# Patient Record
Sex: Female | Born: 1950 | Race: White | Hispanic: No | Smoking: Current some day smoker
Health system: Southern US, Community
[De-identification: ages and names within clinical notes are randomized; demographics above are authoritative.]

## PROBLEM LIST (undated history)

## (undated) DIAGNOSIS — Z973 Presence of spectacles and contact lenses: Secondary | ICD-10-CM

## (undated) DIAGNOSIS — S52502A Unspecified fracture of the lower end of left radius, initial encounter for closed fracture: Secondary | ICD-10-CM

## (undated) DIAGNOSIS — M1990 Unspecified osteoarthritis, unspecified site: Secondary | ICD-10-CM

## (undated) DIAGNOSIS — D649 Anemia, unspecified: Secondary | ICD-10-CM

## (undated) HISTORY — PX: ABDOMINAL HYSTERECTOMY: SHX81

## (undated) HISTORY — PX: JOINT REPLACEMENT: SHX530

## (undated) HISTORY — PX: BACK SURGERY: SHX140

---

## 2012-09-14 ENCOUNTER — Encounter (HOSPITAL_COMMUNITY): Payer: Self-pay | Admitting: Pharmacy Technician

## 2012-09-20 ENCOUNTER — Other Ambulatory Visit: Payer: Self-pay | Admitting: Orthopedic Surgery

## 2012-09-20 NOTE — H&P (Signed)
TOTAL KNEE ADMISSION H&P  Patient is being admitted for left total knee arthroplasty.  Subjective:  Chief Complaint:left knee pain.  HPI: Kim Ponce, 62 y.o. female, has a history of pain and functional disability in the left knee due to arthritis and has failed non-surgical conservative treatments for greater than 12 weeks to includeNSAID's and/or analgesics, corticosteriod injections and activity modification.  Onset of symptoms was gradual, starting 1 years ago with rapidlly worsening course since that time. The patient noted no past surgery on the left knee(s).  Patient currently rates pain in the left knee(s) at 8 out of 10 with activity. Patient has night pain, worsening of pain with activity and weight bearing and pain that interferes with activities of daily living.  Patient has evidence of subchondral cysts and joint space narrowing by imaging studies. There is no active infection.  There are no active problems to display for this patient.  No past medical history on file.  No past surgical history on file.  No prescriptions prior to admission   Allergies  Allergen Reactions  . Morphine And Related Hives  . Penicillins Other (See Comments)    REACTION:  unknown  . Sulfa Antibiotics Other (See Comments)    REACTION:  unknown    History  Substance Use Topics  . Smoking status: Not on file  . Smokeless tobacco: Not on file  . Alcohol Use: Not on file    No family history on file.   Review of Systems  Constitutional: Negative.   HENT: Negative.   Eyes: Negative.   Respiratory: Negative.   Cardiovascular: Negative.   Gastrointestinal: Negative.   Genitourinary: Negative.   Musculoskeletal: Positive for back pain and joint pain.  Skin: Negative.   Neurological: Negative.   Endo/Heme/Allergies: Negative.   Psychiatric/Behavioral: Negative.     Objective:  Physical Exam  Constitutional: She is oriented to person, place, and time. She appears well-developed and  well-nourished.  HENT:  Head: Normocephalic.  Eyes: Pupils are equal, round, and reactive to light.  Cardiovascular: Normal heart sounds.   Respiratory: Breath sounds normal.  GI: Bowel sounds are normal.  Musculoskeletal:       Left knee: She exhibits decreased range of motion and swelling. tenderness found. Medial joint line tenderness noted.  Neurological: She is alert and oriented to person, place, and time.  Psychiatric: She has a normal mood and affect.    Vital signs in last 24 hours:    Labs:   There is no height or weight on file to calculate BMI.   Imaging Review Plain radiographs demonstrate severe degenerative joint disease of the left knee(s). The overall alignment ismild varus. The bone quality appears to be adequate for age and reported activity level.  Assessment/Plan:  End stage arthritis, left knee   The patient history, physical examination, clinical judgment of the provider and imaging studies are consistent with end stage degenerative joint disease of the left knee(s) and total knee arthroplasty is deemed medically necessary. The treatment options including medical management, injection therapy arthroscopy and arthroplasty were discussed at length. The risks and benefits of total knee arthroplasty were presented and reviewed. The risks due to aseptic loosening, infection, stiffness, patella tracking problems, thromboembolic complications and other imponderables were discussed. The patient acknowledged the explanation, agreed to proceed with the plan and consent was signed. Patient is being admitted for inpatient treatment for surgery, pain control, PT, OT, prophylactic antibiotics, VTE prophylaxis, progressive ambulation and ADL's and discharge planning. The patient is planning  to be discharged home with home health services

## 2012-09-21 ENCOUNTER — Ambulatory Visit (HOSPITAL_COMMUNITY)
Admission: RE | Admit: 2012-09-21 | Discharge: 2012-09-21 | Disposition: A | Discharge status: Home / Self Care | Payer: PRIVATE HEALTH INSURANCE | Source: Ambulatory Visit | Attending: Orthopedic Surgery | Admitting: Orthopedic Surgery

## 2012-09-21 ENCOUNTER — Encounter (HOSPITAL_COMMUNITY)
Admission: RE | Admit: 2012-09-21 | Discharge: 2012-09-21 | Disposition: A | Discharge status: Home / Self Care | Payer: PRIVATE HEALTH INSURANCE | Source: Ambulatory Visit | Attending: Orthopedic Surgery | Admitting: Orthopedic Surgery

## 2012-09-21 ENCOUNTER — Encounter (HOSPITAL_COMMUNITY): Payer: Self-pay

## 2012-09-21 DIAGNOSIS — Z01818 Encounter for other preprocedural examination: Secondary | ICD-10-CM | POA: Insufficient documentation

## 2012-09-21 DIAGNOSIS — Z01812 Encounter for preprocedural laboratory examination: Secondary | ICD-10-CM | POA: Insufficient documentation

## 2012-09-21 HISTORY — DX: Unspecified osteoarthritis, unspecified site: M19.90

## 2012-09-21 LAB — SURGICAL PCR SCREEN: Staphylococcus aureus: NEGATIVE

## 2012-09-21 LAB — CBC WITH DIFFERENTIAL/PLATELET
Basophils Absolute: 0 10*3/uL (ref 0.0–0.1)
Basophils Relative: 0 % (ref 0–1)
Eosinophils Absolute: 0.1 10*3/uL (ref 0.0–0.7)
Eosinophils Relative: 1 % (ref 0–5)
HCT: 35.3 % — ABNORMAL LOW (ref 36.0–46.0)
MCHC: 32.9 g/dL (ref 30.0–36.0)
MCV: 94.6 fL (ref 78.0–100.0)
Monocytes Absolute: 0.5 10*3/uL (ref 0.1–1.0)
Platelets: 339 10*3/uL (ref 150–400)
RDW: 12.3 % (ref 11.5–15.5)

## 2012-09-21 LAB — BASIC METABOLIC PANEL
BUN: 16 mg/dL (ref 6–23)
CO2: 24 mEq/L (ref 19–32)
Calcium: 9.7 mg/dL (ref 8.4–10.5)
Chloride: 100 mEq/L (ref 96–112)
Creatinine, Ser: 0.52 mg/dL (ref 0.50–1.10)

## 2012-09-21 LAB — URINALYSIS, ROUTINE W REFLEX MICROSCOPIC
Glucose, UA: NEGATIVE mg/dL
Hgb urine dipstick: NEGATIVE
Specific Gravity, Urine: 1.033 — ABNORMAL HIGH (ref 1.005–1.030)
Urobilinogen, UA: 1 mg/dL (ref 0.0–1.0)
pH: 5.5 (ref 5.0–8.0)

## 2012-09-21 LAB — ABO/RH: ABO/RH(D): A POS

## 2012-09-21 NOTE — Pre-Procedure Instructions (Signed)
Franny Selvage  09/21/2012   Your procedure is scheduled on: Monday, February 3rd.  Report to [facility] at 11 AM.  Call this number if you have problems the morning of surgery: [phone]   Remember:   Do not eat food or drink liquids after midnight.    Take these medicines the morning of surgery with A SIP OF WATER:  Hydrocodone-Acetaminophen (Vicodin) if needed.  Stop taking Aspirin, Coumadin, Plavix, Effient and Herbal medications.  Do not take any NSAIDs ie: Ibuprofen, Advil,Naproxen, Celebrex or any medication containing Aspirin.     Do not wear jewelry, make-up or nail polish.  Do not wear lotions, powders, or perfumes. You may wear deodorant.  Do not shave 48 hours prior to surgery. Men may shave face and neck.  Do not bring valuables to the hospital.  Contacts, dentures or bridgework may not be worn into surgery.  Leave suitcase in the car. After surgery it may be brought to your room.  For patients admitted to the hospital, checkout time is 11:00 AM the day of discharge.    Special Instructions: Shower using CHG 2 nights before surgery and the night before surgery.  If you shower the day of surgery use CHG.  Use special wash - you have one bottle of CHG for all showers.  You should use approximately 1/3 of the bottle for each shower.    Please read over the following fact sheets that you were given: Pain Booklet, Coughing and Deep Breathing, Blood Transfusion Information and Surgical Site Infection Prevention

## 2012-09-23 NOTE — Progress Notes (Signed)
Message left for patient to arrive at [address]

## 2012-09-25 MED ORDER — VANCOMYCIN HCL IN DEXTROSE 1-5 GM/200ML-% IV SOLN
1000.0000 mg | INTRAVENOUS | Status: AC
  Administered 2012-09-26: 1000 mg via INTRAVENOUS
  Filled 2012-09-25: qty 200

## 2012-09-26 ENCOUNTER — Encounter (HOSPITAL_COMMUNITY): Payer: Self-pay | Admitting: Anesthesiology

## 2012-09-26 ENCOUNTER — Encounter (HOSPITAL_COMMUNITY)
Admission: RE | Disposition: A | Discharge status: Home Health | Payer: Self-pay | Source: Ambulatory Visit | Attending: Orthopedic Surgery

## 2012-09-26 ENCOUNTER — Ambulatory Visit (HOSPITAL_COMMUNITY): Payer: PRIVATE HEALTH INSURANCE | Admitting: Anesthesiology

## 2012-09-26 ENCOUNTER — Inpatient Hospital Stay (HOSPITAL_COMMUNITY)
Admission: RE | Admit: 2012-09-26 | Discharge: 2012-09-28 | DRG: 470 | Disposition: A | Discharge status: Home Health | Payer: PRIVATE HEALTH INSURANCE | Source: Ambulatory Visit | Attending: Orthopedic Surgery | Admitting: Orthopedic Surgery

## 2012-09-26 DIAGNOSIS — M1712 Unilateral primary osteoarthritis, left knee: Secondary | ICD-10-CM | POA: Diagnosis present

## 2012-09-26 DIAGNOSIS — D62 Acute posthemorrhagic anemia: Secondary | ICD-10-CM | POA: Diagnosis not present

## 2012-09-26 DIAGNOSIS — Z882 Allergy status to sulfonamides status: Secondary | ICD-10-CM

## 2012-09-26 DIAGNOSIS — Z880 Allergy status to penicillin: Secondary | ICD-10-CM

## 2012-09-26 DIAGNOSIS — Z885 Allergy status to narcotic agent status: Secondary | ICD-10-CM

## 2012-09-26 DIAGNOSIS — M1710 Unilateral primary osteoarthritis, unspecified knee: Principal | ICD-10-CM | POA: Diagnosis present

## 2012-09-26 HISTORY — PX: TOTAL KNEE ARTHROPLASTY: SHX125

## 2012-09-26 SURGERY — ARTHROPLASTY, KNEE, TOTAL
Anesthesia: General | Site: Knee | Laterality: Left | Wound class: Clean

## 2012-09-26 MED ORDER — MEPERIDINE HCL 25 MG/ML IJ SOLN: 6.2500 mg | INTRAMUSCULAR | Status: DC | PRN

## 2012-09-26 MED ORDER — PROMETHAZINE HCL 25 MG/ML IJ SOLN: 6.2500 mg | INTRAMUSCULAR | Status: DC | PRN

## 2012-09-26 MED ORDER — FENTANYL CITRATE 0.05 MG/ML IJ SOLN
INTRAMUSCULAR | Status: DC | PRN
  Administered 2012-09-26: 25 ug via INTRAVENOUS
  Administered 2012-09-26: 100 ug via INTRAVENOUS
  Administered 2012-09-26: 25 ug via INTRAVENOUS
  Administered 2012-09-26: 50 ug via INTRAVENOUS
  Administered 2012-09-26: 100 ug via INTRAVENOUS
  Administered 2012-09-26: 50 ug via INTRAVENOUS
  Administered 2012-09-26 (×2): 25 ug via INTRAVENOUS
  Administered 2012-09-26 (×2): 50 ug via INTRAVENOUS

## 2012-09-26 MED ORDER — ONDANSETRON HCL 4 MG/2ML IJ SOLN: 4.0000 mg | Freq: Four times a day (QID) | INTRAMUSCULAR | Status: DC | PRN

## 2012-09-26 MED ORDER — OXYCODONE HCL 5 MG PO TABS
5.0000 mg | ORAL_TABLET | Freq: Once | ORAL | Status: AC | PRN
  Administered 2012-09-26: 5 mg via ORAL

## 2012-09-26 MED ORDER — KCL IN DEXTROSE-NACL 20-5-0.45 MEQ/L-%-% IV SOLN
INTRAVENOUS | Status: DC
  Administered 2012-09-26 – 2012-09-28 (×5): via INTRAVENOUS
  Filled 2012-09-26 (×8): qty 1000

## 2012-09-26 MED ORDER — LACTATED RINGERS IV SOLN
INTRAVENOUS | Status: DC
  Administered 2012-09-26: 10:00:00 via INTRAVENOUS

## 2012-09-26 MED ORDER — LIDOCAINE HCL (CARDIAC) 20 MG/ML IV SOLN
INTRAVENOUS | Status: DC | PRN
  Administered 2012-09-26: 80 mg via INTRAVENOUS

## 2012-09-26 MED ORDER — CHLORHEXIDINE GLUCONATE 4 % EX LIQD
60.0000 mL | Freq: Once | CUTANEOUS | Status: DC
Start: 2012-09-26 — End: 2012-09-26

## 2012-09-26 MED ORDER — MENTHOL 3 MG MT LOZG: 1.0000 | LOZENGE | OROMUCOSAL | Status: DC | PRN

## 2012-09-26 MED ORDER — METHOCARBAMOL 100 MG/ML IJ SOLN
500.0000 mg | Freq: Four times a day (QID) | INTRAVENOUS | Status: DC | PRN
  Administered 2012-09-26: 500 mg via INTRAVENOUS
  Filled 2012-09-26: qty 5

## 2012-09-26 MED ORDER — METOCLOPRAMIDE HCL 5 MG/ML IJ SOLN: 5.0000 mg | Freq: Three times a day (TID) | INTRAMUSCULAR | Status: DC | PRN

## 2012-09-26 MED ORDER — DEXTROSE-NACL 5-0.45 % IV SOLN: INTRAVENOUS | Status: DC

## 2012-09-26 MED ORDER — HYDROMORPHONE HCL PF 1 MG/ML IJ SOLN
INTRAMUSCULAR | Status: AC
  Administered 2012-09-26: 0.5 mg via INTRAVENOUS
  Filled 2012-09-26: qty 1

## 2012-09-26 MED ORDER — ARTIFICIAL TEARS OP OINT
TOPICAL_OINTMENT | OPHTHALMIC | Status: DC | PRN
  Administered 2012-09-26: 1 via OPHTHALMIC

## 2012-09-26 MED ORDER — ACETAMINOPHEN 650 MG RE SUPP: 650.0000 mg | Freq: Four times a day (QID) | RECTAL | Status: DC | PRN

## 2012-09-26 MED ORDER — CEFUROXIME SODIUM 1.5 G IJ SOLR
INTRAMUSCULAR | Status: AC
  Filled 2012-09-26: qty 1.5

## 2012-09-26 MED ORDER — CELECOXIB 200 MG PO CAPS
200.0000 mg | ORAL_CAPSULE | Freq: Every day | ORAL | Status: DC
  Administered 2012-09-26 – 2012-09-28 (×3): 200 mg via ORAL
  Filled 2012-09-26 (×3): qty 1

## 2012-09-26 MED ORDER — HYDROMORPHONE HCL PF 1 MG/ML IJ SOLN
0.2500 mg | INTRAMUSCULAR | Status: DC | PRN
  Administered 2012-09-26 (×7): 0.5 mg via INTRAVENOUS

## 2012-09-26 MED ORDER — BUPIVACAINE-EPINEPHRINE PF 0.5-1:200000 % IJ SOLN
INTRAMUSCULAR | Status: DC | PRN
  Administered 2012-09-26: 30 mL

## 2012-09-26 MED ORDER — PROPOFOL 10 MG/ML IV BOLUS
INTRAVENOUS | Status: DC | PRN
  Administered 2012-09-26: 170 mg via INTRAVENOUS

## 2012-09-26 MED ORDER — OXYCODONE HCL 5 MG/5ML PO SOLN: 5.0000 mg | Freq: Once | ORAL | Status: AC | PRN

## 2012-09-26 MED ORDER — OXYCODONE HCL 5 MG PO TABS
5.0000 mg | ORAL_TABLET | ORAL | Status: DC | PRN
  Administered 2012-09-26 – 2012-09-28 (×11): 10 mg via ORAL
  Filled 2012-09-26 (×13): qty 2

## 2012-09-26 MED ORDER — HYDROMORPHONE HCL PF 1 MG/ML IJ SOLN
INTRAMUSCULAR | Status: AC
  Administered 2012-09-27: 1 mg via INTRAVENOUS
  Filled 2012-09-26: qty 1

## 2012-09-26 MED ORDER — LACTATED RINGERS IV SOLN
INTRAVENOUS | Status: DC | PRN
  Administered 2012-09-26: 11:00:00 via INTRAVENOUS

## 2012-09-26 MED ORDER — MIDAZOLAM HCL 5 MG/5ML IJ SOLN
INTRAMUSCULAR | Status: DC | PRN
  Administered 2012-09-26 (×2): 1 mg via INTRAVENOUS

## 2012-09-26 MED ORDER — DEXAMETHASONE SODIUM PHOSPHATE 4 MG/ML IJ SOLN
INTRAMUSCULAR | Status: DC | PRN
  Administered 2012-09-26: 4 mg via INTRAVENOUS

## 2012-09-26 MED ORDER — ASPIRIN EC 325 MG PO TBEC
325.0000 mg | DELAYED_RELEASE_TABLET | Freq: Two times a day (BID) | ORAL | Status: DC
  Administered 2012-09-26 – 2012-09-28 (×4): 325 mg via ORAL
  Filled 2012-09-26 (×5): qty 1

## 2012-09-26 MED ORDER — ACETAMINOPHEN 10 MG/ML IV SOLN
1000.0000 mg | Freq: Four times a day (QID) | INTRAVENOUS | Status: AC
  Administered 2012-09-26 – 2012-09-27 (×4): 1000 mg via INTRAVENOUS
  Filled 2012-09-26 (×4): qty 100

## 2012-09-26 MED ORDER — MIDAZOLAM HCL 2 MG/2ML IJ SOLN
1.0000 mg | INTRAMUSCULAR | Status: DC | PRN
  Administered 2012-09-26: 2 mg via INTRAVENOUS

## 2012-09-26 MED ORDER — ONDANSETRON HCL 4 MG PO TABS: 4.0000 mg | ORAL_TABLET | Freq: Four times a day (QID) | ORAL | Status: DC | PRN

## 2012-09-26 MED ORDER — BISACODYL 5 MG PO TBEC: 5.0000 mg | DELAYED_RELEASE_TABLET | Freq: Every day | ORAL | Status: DC | PRN

## 2012-09-26 MED ORDER — MORPHINE SULFATE (PF) 1 MG/ML IV SOLN
INTRAVENOUS | Status: AC
  Filled 2012-09-26: qty 25

## 2012-09-26 MED ORDER — METOCLOPRAMIDE HCL 10 MG PO TABS: 5.0000 mg | ORAL_TABLET | Freq: Three times a day (TID) | ORAL | Status: DC | PRN

## 2012-09-26 MED ORDER — OXYCODONE HCL 5 MG PO TABS
ORAL_TABLET | ORAL | Status: AC
  Administered 2012-09-26: 5 mg via ORAL
  Filled 2012-09-26: qty 1

## 2012-09-26 MED ORDER — ALUM & MAG HYDROXIDE-SIMETH 200-200-20 MG/5ML PO SUSP: 30.0000 mL | ORAL | Status: DC | PRN

## 2012-09-26 MED ORDER — SODIUM CHLORIDE 0.9 % IR SOLN
Status: DC | PRN
  Administered 2012-09-26: 1000 mL
  Administered 2012-09-26: 3000 mL

## 2012-09-26 MED ORDER — HYDROMORPHONE HCL PF 1 MG/ML IJ SOLN
1.0000 mg | INTRAMUSCULAR | Status: DC | PRN
  Administered 2012-09-26 – 2012-09-27 (×5): 1 mg via INTRAVENOUS
  Filled 2012-09-26 (×5): qty 1

## 2012-09-26 MED ORDER — ACETAMINOPHEN 325 MG PO TABS
650.0000 mg | ORAL_TABLET | Freq: Four times a day (QID) | ORAL | Status: DC | PRN
  Administered 2012-09-27 – 2012-09-28 (×2): 650 mg via ORAL
  Filled 2012-09-26 (×2): qty 2

## 2012-09-26 MED ORDER — METHOCARBAMOL 500 MG PO TABS
500.0000 mg | ORAL_TABLET | Freq: Four times a day (QID) | ORAL | Status: DC | PRN
  Administered 2012-09-26 – 2012-09-28 (×5): 500 mg via ORAL
  Filled 2012-09-26 (×6): qty 1

## 2012-09-26 MED ORDER — PHENOL 1.4 % MT LIQD: 1.0000 | OROMUCOSAL | Status: DC | PRN

## 2012-09-26 MED ORDER — DIPHENHYDRAMINE HCL 12.5 MG/5ML PO ELIX: 12.5000 mg | ORAL_SOLUTION | ORAL | Status: DC | PRN

## 2012-09-26 MED ORDER — ONDANSETRON HCL 4 MG/2ML IJ SOLN
INTRAMUSCULAR | Status: DC | PRN
  Administered 2012-09-26: 4 mg via INTRAVENOUS

## 2012-09-26 MED ORDER — FENTANYL CITRATE 0.05 MG/ML IJ SOLN
100.0000 ug | INTRAMUSCULAR | Status: DC | PRN
  Administered 2012-09-26: 100 ug via INTRAVENOUS

## 2012-09-26 MED ORDER — FENTANYL CITRATE 0.05 MG/ML IJ SOLN
INTRAMUSCULAR | Status: AC
  Filled 2012-09-26: qty 2

## 2012-09-26 MED ORDER — FLEET ENEMA 7-19 GM/118ML RE ENEM: 1.0000 | ENEMA | Freq: Once | RECTAL | Status: AC | PRN

## 2012-09-26 MED ORDER — MIDAZOLAM HCL 2 MG/2ML IJ SOLN
INTRAMUSCULAR | Status: AC
  Filled 2012-09-26: qty 2

## 2012-09-26 MED ORDER — MAGNESIUM HYDROXIDE 400 MG/5ML PO SUSP: 30.0000 mL | Freq: Every day | ORAL | Status: DC | PRN

## 2012-09-26 MED ORDER — CEFUROXIME SODIUM 1.5 G IJ SOLR
INTRAMUSCULAR | Status: DC | PRN
  Administered 2012-09-26: 1.5 g

## 2012-09-26 SURGICAL SUPPLY — 52 items
BANDAGE ELASTIC 6 VELCRO ST LF (GAUZE/BANDAGES/DRESSINGS) ×2 IMPLANT
BANDAGE ESMARK 6X9 LF (GAUZE/BANDAGES/DRESSINGS) ×1 IMPLANT
BLADE SAG 18X100X1.27 (BLADE) ×2 IMPLANT
BLADE SAW SGTL 13X75X1.27 (BLADE) ×2 IMPLANT
BLADE SURG ROTATE 9660 (MISCELLANEOUS) IMPLANT
BNDG ELASTIC 6X10 VLCR STRL LF (GAUZE/BANDAGES/DRESSINGS) ×2 IMPLANT
BNDG ESMARK 6X9 LF (GAUZE/BANDAGES/DRESSINGS) ×2
BOWL SMART MIX CTS (DISPOSABLE) ×2 IMPLANT
CEMENT HV SMART SET (Cement) ×4 IMPLANT
CLOTH BEACON ORANGE TIMEOUT ST (SAFETY) ×2 IMPLANT
COVER BACK TABLE 24X17X13 BIG (DRAPES) IMPLANT
COVER SURGICAL LIGHT HANDLE (MISCELLANEOUS) ×2 IMPLANT
CUFF TOURNIQUET SINGLE 34IN LL (TOURNIQUET CUFF) ×2 IMPLANT
CUFF TOURNIQUET SINGLE 44IN (TOURNIQUET CUFF) IMPLANT
DRAPE EXTREMITY T 121X128X90 (DRAPE) ×2 IMPLANT
DRAPE U-SHAPE 47X51 STRL (DRAPES) ×2 IMPLANT
DURAPREP 26ML APPLICATOR (WOUND CARE) ×2 IMPLANT
ELECT REM PT RETURN 9FT ADLT (ELECTROSURGICAL) ×2
ELECTRODE REM PT RTRN 9FT ADLT (ELECTROSURGICAL) ×1 IMPLANT
EVACUATOR 1/8 PVC DRAIN (DRAIN) ×2 IMPLANT
GAUZE XEROFORM 1X8 LF (GAUZE/BANDAGES/DRESSINGS) ×2 IMPLANT
GLOVE BIO SURGEON STRL SZ7 (GLOVE) ×2 IMPLANT
GLOVE BIO SURGEON STRL SZ7.5 (GLOVE) ×2 IMPLANT
GLOVE BIOGEL PI IND STRL 7.0 (GLOVE) ×1 IMPLANT
GLOVE BIOGEL PI IND STRL 8 (GLOVE) ×1 IMPLANT
GLOVE BIOGEL PI INDICATOR 7.0 (GLOVE) ×1
GLOVE BIOGEL PI INDICATOR 8 (GLOVE) ×1
GOWN PREVENTION PLUS XLARGE (GOWN DISPOSABLE) ×4 IMPLANT
GOWN STRL NON-REIN LRG LVL3 (GOWN DISPOSABLE) ×2 IMPLANT
HANDPIECE INTERPULSE COAX TIP (DISPOSABLE) ×1
HOOD PEEL AWAY FACE SHEILD DIS (HOOD) ×6 IMPLANT
KIT BASIN OR (CUSTOM PROCEDURE TRAY) ×2 IMPLANT
KIT ROOM TURNOVER OR (KITS) ×2 IMPLANT
MANIFOLD NEPTUNE II (INSTRUMENTS) ×2 IMPLANT
NS IRRIG 1000ML POUR BTL (IV SOLUTION) ×2 IMPLANT
PACK TOTAL JOINT (CUSTOM PROCEDURE TRAY) ×2 IMPLANT
PAD ARMBOARD 7.5X6 YLW CONV (MISCELLANEOUS) ×4 IMPLANT
PADDING CAST COTTON 6X4 STRL (CAST SUPPLIES) ×2 IMPLANT
SET HNDPC FAN SPRY TIP SCT (DISPOSABLE) ×1 IMPLANT
SPONGE GAUZE 4X4 12PLY (GAUZE/BANDAGES/DRESSINGS) ×2 IMPLANT
STAPLER VISISTAT 35W (STAPLE) ×2 IMPLANT
SUCTION FRAZIER TIP 10 FR DISP (SUCTIONS) ×2 IMPLANT
SUT VIC AB 0 CTX 36 (SUTURE) ×1
SUT VIC AB 0 CTX36XBRD ANTBCTR (SUTURE) ×1 IMPLANT
SUT VIC AB 1 CTX 36 (SUTURE) ×1
SUT VIC AB 1 CTX36XBRD ANBCTR (SUTURE) ×1 IMPLANT
SUT VIC AB 2-0 CT1 27 (SUTURE) ×1
SUT VIC AB 2-0 CT1 TAPERPNT 27 (SUTURE) ×1 IMPLANT
TOWEL OR 17X24 6PK STRL BLUE (TOWEL DISPOSABLE) ×2 IMPLANT
TOWEL OR 17X26 10 PK STRL BLUE (TOWEL DISPOSABLE) ×2 IMPLANT
TRAY FOLEY CATH 14FR (SET/KITS/TRAYS/PACK) ×2 IMPLANT
WATER STERILE IRR 1000ML POUR (IV SOLUTION) ×2 IMPLANT

## 2012-09-26 NOTE — Interval H&P Note (Signed)
History and Physical Interval Note:  09/26/2012 10:32 AM  Kim Ponce  has presented today for surgery, with the diagnosis of OSTEOARTHRITIS LEFT KNEE  The various methods of treatment have been discussed with the patient and family. After consideration of risks, benefits and other options for treatment, the patient has consented to  Procedure(s) (LRB) with comments: TOTAL KNEE ARTHROPLASTY (Left) as a surgical intervention .  The patient's history has been reviewed, patient examined, no change in status, stable for surgery.  I have reviewed the patient's chart and labs.  Questions were answered to the patient's satisfaction.     Nestor Lewandowsky

## 2012-09-26 NOTE — Interval H&P Note (Signed)
History and Physical Interval Note:  09/26/2012 10:31 AM  Kim Ponce  has presented today for surgery, with the diagnosis of OSTEOARTHRITIS LEFT KNEE  The various methods of treatment have been discussed with the patient and family. After consideration of risks, benefits and other options for treatment, the patient has consented to  Procedure(s) (LRB) with comments: TOTAL KNEE ARTHROPLASTY (Left) as a surgical intervention .  The patient's history has been reviewed, patient examined, no change in status, stable for surgery.  I have reviewed the patient's chart and labs.  Questions were answered to the patient's satisfaction.     Nestor Lewandowsky

## 2012-09-26 NOTE — Evaluation (Signed)
Physical Therapy Evaluation Patient Details Name: Kim Ponce MRN: [id number] DOB: September 18, 1950 Today's Date: 09/26/2012 Time: 7829-5621 PT Time Calculation (min): 33 min  PT Assessment / Plan / Recommendation Clinical Impression  Patient is a 62 yo female s/p Lt. TKA.  Will benefit from acute PT to maximize independence prior to return home with 24 hour assist from sister and friends.  Recommend HHPT and RW for home.    PT Assessment  Patient needs continued PT services    Follow Up Recommendations  Home health PT;Supervision/Assistance - 24 hour    Does the patient have the potential to tolerate intense rehabilitation      Barriers to Discharge None      Equipment Recommendations  Rolling walker with 5" wheels    Recommendations for Other Services     Frequency 7X/week    Precautions / Restrictions Precautions Precautions: Knee Precaution Booklet Issued: Yes (comment) Precaution Comments: Reviewed precautions with patient. Restrictions Weight Bearing Restrictions: Yes LLE Weight Bearing: Weight bearing as tolerated   Pertinent Vitals/Pain       Mobility  Bed Mobility Bed Mobility: Supine to Sit;Sitting - Scoot to Edge of Bed;Sit to Supine Supine to Sit: 4: Min assist;HOB flat Sitting - Scoot to Edge of Bed: 4: Min guard Sit to Supine: 4: Min assist;HOB flat Details for Bed Mobility Assistance: Verbal cues for technique.  Assist to move LLE off of and onto bed.  Patient sat EOB x 10 minutes with good balance and upright posture. Transfers Transfers: Not assessed    Exercises Total Joint Exercises Ankle Circles/Pumps: AROM;Both;10 reps;Supine   PT Diagnosis: Difficulty walking;Acute pain  PT Problem List: Decreased strength;Decreased range of motion;Decreased activity tolerance;Decreased mobility;Decreased knowledge of use of DME;Decreased knowledge of precautions;Pain PT Treatment Interventions: DME instruction;Gait training;Functional mobility  training;Therapeutic exercise;Patient/family education   PT Goals Acute Rehab PT Goals PT Goal Formulation: With patient Time For Goal Achievement: 10/03/12 Potential to Achieve Goals: Good Pt will go Supine/Side to Sit: Independently;with HOB 0 degrees PT Goal: Supine/Side to Sit - Progress: Goal set today Pt will go Sit to Supine/Side: Independently;with HOB 0 degrees PT Goal: Sit to Supine/Side - Progress: Goal set today Pt will go Sit to Stand: with supervision;with upper extremity assist PT Goal: Sit to Stand - Progress: Goal set today Pt will Ambulate: >150 feet;with supervision;with rolling walker PT Goal: Ambulate - Progress: Goal set today Pt will Perform Home Exercise Program: with supervision, verbal cues required/provided PT Goal: Perform Home Exercise Program - Progress: Goal set today  Visit Information  Last PT Received On: 09/26/12 Assistance Needed: +1    Subjective Data  Subjective: "My sister is coming to stay with me." Patient Stated Goal: To walk.  To decrease pain.   Prior Functioning  Home Living Lives With: Alone Available Help at Discharge: Family;Friend(s);Available 24 hours/day (Sister coming to stay with patient.) Type of Home: Apartment Home Access: Level entry Home Layout: One level Bathroom Shower/Tub: Engineer, manufacturing systems: Standard Bathroom Accessibility: Yes How Accessible: Accessible via walker Home Adaptive Equipment: Crutches;Straight cane Prior Function Level of Independence: Independent with assistive device(s);Needs assistance (using crutches or cane) Needs Assistance: Light Housekeeping Light Housekeeping: Maximal Able to Take Stairs?: No Driving: No Vocation: Unemployed Communication Communication: No difficulties    Cognition  Cognition Overall Cognitive Status: Appears within functional limits for tasks assessed/performed Arousal/Alertness: Awake/alert Orientation Level: Oriented X4 / Intact Behavior During  Session: [facility] for tasks performed    Extremity/Trunk Assessment Right Upper Extremity Assessment RUE ROM/Strength/Tone:  Deficits RUE ROM/Strength/Tone Deficits: Patient reports weakness/discomfort upper arms from using crutches pta. RUE Sensation: WFL - Light Touch Left Upper Extremity Assessment LUE ROM/Strength/Tone: Deficits LUE ROM/Strength/Tone Deficits: Patient reports weakness/discomfort upper arms from using crutches pta. LUE Sensation: WFL - Light Touch Right Lower Extremity Assessment RLE ROM/Strength/Tone: WFL for tasks assessed (Reports she needs to have a TKA on RLE as well) RLE Sensation: WFL - Light Touch Left Lower Extremity Assessment LLE ROM/Strength/Tone: Deficits;Unable to fully assess;Due to pain LLE ROM/Strength/Tone Deficits: Able to assist moving LLE off of and onto bed. Trunk Assessment Trunk Assessment: Normal   Balance Balance Balance Assessed: Yes Static Sitting Balance Static Sitting - Balance Support: No upper extremity supported;Feet supported Static Sitting - Level of Assistance: 5: Stand by assistance Static Sitting - Comment/# of Minutes: Patient able to maintain sitting balance x 10 minutes without UE support.  End of Session PT - End of Session Equipment Utilized During Treatment: Oxygen Activity Tolerance: Patient limited by pain;Patient limited by fatigue Patient left: in bed;in CPM;with call bell/phone within reach;with family/visitor present Nurse Communication: Mobility status;Patient requests pain meds CPM Left Knee CPM Left Knee: On Left Knee Flexion (Degrees): 60  Left Knee Extension (Degrees): 0   GP     Vena Austria 09/26/2012, 5:32 PM Durenda Hurt. Renaldo Fiddler, [facility] Acute Rehab Services Pager [phone]

## 2012-09-26 NOTE — Anesthesia Preprocedure Evaluation (Signed)
Anesthesia Evaluation  Patient identified by MRN, date of birth, ID band Patient awake    Reviewed: Allergy & Precautions, H&P , NPO status , Patient's Chart, lab work & pertinent test results  History of Anesthesia Complications Negative for: history of anesthetic complications  Airway Mallampati: I      Dental No notable dental hx. (+) Teeth Intact   Pulmonary neg pulmonary ROS,  breath sounds clear to auscultation  Pulmonary exam normal       Cardiovascular negative cardio ROS  IRhythm:regular Rate:Normal     Neuro/Psych negative neurological ROS  negative psych ROS   GI/Hepatic negative GI ROS, Neg liver ROS,   Endo/Other  negative endocrine ROS  Renal/GU negative Renal ROS  negative genitourinary   Musculoskeletal   Abdominal   Peds  Hematology negative hematology ROS (+)   Anesthesia Other Findings   Reproductive/Obstetrics negative OB ROS                           Anesthesia Physical Anesthesia Plan  ASA: I  Anesthesia Plan: General and General LMA   Post-op Pain Management: MAC Combined w/ Regional for Post-op pain   Induction:   Airway Management Planned:   Additional Equipment:   Intra-op Plan:   Post-operative Plan:   Informed Consent: I have reviewed the patients History and Physical, chart, labs and discussed the procedure including the risks, benefits and alternatives for the proposed anesthesia with the patient or authorized representative who has indicated his/her understanding and acceptance.   Dental Advisory Given  Plan Discussed with: CRNA and Surgeon  Anesthesia Plan Comments:         Anesthesia Quick Evaluation

## 2012-09-26 NOTE — Progress Notes (Signed)
Dr. Justin Mend to bedside.  Pt. States pain is better.  Pt. Resting quietly.

## 2012-09-26 NOTE — Progress Notes (Signed)
Orthopedic Tech Progress Note Patient Details:  Kim Ponce 1951/07/04 [id number]  CPM Left Knee CPM Left Knee: On Left Knee Flexion (Degrees): 60  Left Knee Extension (Degrees): 0    Shawnie Pons 09/26/2012, 1:30 PM

## 2012-09-26 NOTE — Progress Notes (Signed)
Notified Dr. Justin Mend of pts continued pain level of 10 with tears despite 1.5 mg Dilaudid and Oxy IR 5mg .  Dr. Justin Mend stated to continue with additional Dilaudid.

## 2012-09-26 NOTE — Op Note (Signed)
PATIENT ID:      Kim Ponce  MRN:     [id number] DOB/AGE:    61-Apr-1952 / 61 y.o.       OPERATIVE REPORT    DATE OF PROCEDURE:  09/26/2012       PREOPERATIVE DIAGNOSIS:   OSTEOARTHRITIS LEFT KNEE      There is no height or weight on file to calculate BMI.                                                        POSTOPERATIVE DIAGNOSIS:   OSTEOARTHRITIS LEFT KNEE                                                                      PROCEDURE:  Procedure(s): TOTAL KNEE ARTHROPLASTY Using Depuy Sigma RP implants #5L Femur, #4Tibia, 10mm sigma RP bearing, 25 Patella     SURGEON: Haislee Corso J    ASSISTANT:   Shirl Harris PA-C   (Present and scrubbed throughout the case, critical for assistance with exposure, retraction, instrumentation, and closure.)         ANESTHESIA: GET with Femoral Nerve Block  DRAINS: foley, 2 medium hemovac in knee   TOURNIQUET TIME:   COMPLICATIONS:  None     SPECIMENS: None   INDICATIONS FOR PROCEDURE: The patient has  OSTEOARTHRITIS LEFT KNEE, varus deformities, XR shows bone on bone arthritis. Patient has failed all conservative measures including anti-inflammatory medicines, narcotics, attempts at  exercise and weight loss, cortisone injections and viscosupplementation.  Risks and benefits of surgery have been discussed, questions answered.   DESCRIPTION OF PROCEDURE: The patient identified by armband, received  right femoral nerve block and IV antibiotics, in the holding area at [facility]. Patient taken to the operating room, appropriate anesthetic  monitors were attached General endotracheal anesthesia induced with  the patient in supine position, Foley catheter was inserted. Tourniquet  applied high to the operative thigh. Lateral post and foot positioner  applied to the table, the lower extremity was then prepped and draped  in usual sterile fashion from the ankle to the tourniquet. Time-out procedure was performed. The limb was  wrapped with an Esmarch bandage and the tourniquet inflated to 350 mmHg. We began the operation by making the anterior midline incision starting at handbreadth above the patella going over the patella 1 cm medial to and  4 cm distal to the tibial tubercle. Small bleeders in the skin and the  subcutaneous tissue identified and cauterized. Transverse retinaculum was incised and reflected medially and a medial parapatellar arthrotomy was accomplished. the patella was everted and theprepatellar fat pad resected. The superficial medial collateral  ligament was then elevated from anterior to posterior along the proximal  flare of the tibia and anterior half of the menisci resected. The knee was hyperflexed exposing bone on bone arthritis. Peripheral and notch osteophytes as well as the cruciate ligaments were then resected. We continued to  work our way around posteriorly along the proximal tibia, and externally  rotated the tibia subluxing it out from underneath the femur. A  McHale  retractor was placed through the notch and a lateral Hohmann retractor  placed, and we then drilled through the proximal tibia in line with the  axis of the tibia followed by an intramedullary guide rod and 2-degree  posterior slope cutting guide. The tibial cutting guide was pinned into place  allowing resection of 4 mm of bone medially and about 8 mm of bone  laterally because of her varus deformity. Satisfied with the tibial resection, we then  entered the distal femur 2 mm anterior to the PCL origin with the  intramedullary guide rod and applied the distal femoral cutting guide  set at 11mm, with 5 degrees of valgus. This was pinned along the  epicondylar axis. At this point, the distal femoral cut was accomplished without difficulty. We then sized for a #5R femoral component and pinned the guide in 3 degrees of external rotation.The chamfer cutting guide was pinned into place. The anterior, posterior, and chamfer cuts  were accomplished without difficulty followed by  the Sigma RP box cutting guide and the box cut. We also removed posterior osteophytes from the posterior femoral condyles. At this  time, the knee was brought into full extension. We checked our  extension and flexion gaps and found them symmetric at 10mm.  The patella thickness measured at 23 mm. We set the cutting guide at 14 and removed the posterior 9 mm  of the patella sized for 35 button and drilled the lollipop. The knee  was then once again hyperflexed exposing the proximal tibia. We sized for a #4 tibial base plate, applied the smokestack and the conical reamer followed by the the Delta fin keel punch. We then hammered into place the Sigma RP trial femoral component, inserted a 10-mm trial bearing, trial patellar button, and took the knee through range of motion from 0-130 degrees. No thumb pressure was required for patellar  tracking. At this point, all trial components were removed, a double batch of DePuy HV cement with 1500 mg of Zinacef was mixed and applied to all bony metallic mating surfaces except for the posterior condyles of the femur itself. In order, we  hammered into place the tibial tray and removed excess cement, the femoral component and removed excess cement, a 10-mm Sigma RP bearing  was inserted, and the knee brought to full extension with compression.  The patellar button was clamped into place, and excess cement  removed. While the cement cured the wound was irrigated out with normal saline solution pulse lavage, and medium Hemovac drains were placed from an anterolateral  approach. Ligament stability and patellar tracking were checked and found to be excellent. The parapatellar arthrotomy was closed with  running #1 Vicryl suture. The subcutaneous tissue with 0 and 2-0 undyed  Vicryl suture, and the skin with skin staples. A dressing of Xeroform,  4 x 4, dressing sponges, Webril, and Ace wrap applied. The patient   awakened, extubated, and taken to recovery room without difficulty.   [name] J 09/26/2012, 12:04 PM

## 2012-09-26 NOTE — Progress Notes (Signed)
UR COMPLETED  

## 2012-09-26 NOTE — Plan of Care (Signed)
Problem: Consults Goal: Diagnosis- Total Joint Replacement Primary Total Knee LEFT     

## 2012-09-26 NOTE — Anesthesia Postprocedure Evaluation (Signed)
Anesthesia Post Note  Patient: Kim Ponce  Procedure(s) Performed: Procedure(s) (LRB): TOTAL KNEE ARTHROPLASTY (Left)  Anesthesia type: general  Patient location: PACU  Post pain: Pain level controlled  Post assessment: Patient's Cardiovascular Status Stable  Last Vitals:  Filed Vitals:   09/26/12 1345  BP: 133/66  Pulse: 61  Temp:   Resp: 12    Post vital signs: Reviewed and stable  Level of consciousness: sedated  Complications: No apparent anesthesia complications

## 2012-09-26 NOTE — Preoperative (Signed)
Beta Blockers   Reason not to administer Beta Blockers: not prescribed 

## 2012-09-26 NOTE — Anesthesia Procedure Notes (Addendum)
Anesthesia Regional Block:  Femoral nerve block  Pre-Anesthetic Checklist: ,, timeout performed, Correct Patient, Correct Site, Correct Laterality, Correct Procedure, Correct Position, site marked, Risks and benefits discussed, at surgeon's request and post-op pain management  Laterality: Left and Upper  Prep: chloraprep       Needles:  Injection technique: Single-shot  Needle Type: Echogenic Needle      Needle Gauge: 22 and 22 G  Needle insertion depth: 6 cm   Additional Needles:  Procedures: ultrasound guided (picture in chart) and nerve stimulator Femoral nerve block  Nerve Stimulator or Paresthesia:  Response: Twitch elicited, 0.8 mA,   Additional Responses:   Narrative:  Start time: 09/26/2012 9:55 AM End time: 09/26/2012 10:11 AM Injection made incrementally with aspirations every 5 mL.  Performed by: Personally  Anesthesiologist: Alma Friendly, MD  Additional Notes: BP cuff, EKG monitors applied. Sedation begun. Femoral artery palpated for location of nerve. After nerve location anesthetic injected incrementally, slowly , and after neg aspirations. Tolerated well.  Femoral nerve block Procedure Name: LMA Insertion Date/Time: 09/26/2012 10:46 AM Performed by: Gayla Medicus Pre-anesthesia Checklist: Patient identified, Timeout performed, Emergency Drugs available, Suction available and Patient being monitored Patient Re-evaluated:Patient Re-evaluated prior to inductionOxygen Delivery Method: Circle system utilized Preoxygenation: Pre-oxygenation with 100% oxygen Intubation Type: IV induction LMA: LMA inserted LMA Size: 4.0 Number of attempts: 1 Placement Confirmation: positive ETCO2 and breath sounds checked- equal and bilateral Tube secured with: Tape Dental Injury: Teeth and Oropharynx as per pre-operative assessment

## 2012-09-26 NOTE — Transfer of Care (Signed)
Immediate Anesthesia Transfer of Care Note  Patient: Kim Ponce  Procedure(s) Performed: Procedure(s) (LRB) with comments: TOTAL KNEE ARTHROPLASTY (Left)  Patient Location: PACU  Anesthesia Type:GA combined with regional for post-op pain  Level of Consciousness: awake, alert  and oriented  Airway & Oxygen Therapy: Patient Spontanous Breathing and Patient connected to nasal cannula oxygen  Post-op Assessment: Report given to PACU RN and Post -op Vital signs reviewed and stable  Post vital signs: Reviewed and stable  Complications: No apparent anesthesia complications

## 2012-09-27 ENCOUNTER — Encounter (HOSPITAL_COMMUNITY): Payer: Self-pay | Admitting: General Practice

## 2012-09-27 LAB — BASIC METABOLIC PANEL
BUN: 6 mg/dL (ref 6–23)
Calcium: 8.6 mg/dL (ref 8.4–10.5)
GFR calc non Af Amer: >90 mL/min (ref >90)
Glucose, Bld: 107 mg/dL — ABNORMAL HIGH (ref 70–99)

## 2012-09-27 LAB — CBC
HCT: 26.7 % — ABNORMAL LOW (ref 36.0–46.0)
Hemoglobin: 8.8 g/dL — ABNORMAL LOW (ref 12.0–15.0)
MCH: 30.9 pg (ref 26.0–34.0)
MCHC: 33 g/dL (ref 30.0–36.0)

## 2012-09-27 MED ORDER — ZOLPIDEM TARTRATE 5 MG PO TABS
5.0000 mg | ORAL_TABLET | Freq: Every evening | ORAL | Status: DC | PRN
  Administered 2012-09-27: 5 mg via ORAL
  Filled 2012-09-27: qty 1

## 2012-09-27 NOTE — Progress Notes (Signed)
Patient ID: Kim Ponce, female   DOB: [birth year], 62 y.o.   MRN: [id number] PATIENT ID: Kim Ponce  MRN: [id number]  DOB/AGE:  May 13, 1951 / 62 y.o.  1 Day Post-Op Procedure(s) (LRB): TOTAL KNEE ARTHROPLASTY (Left)    PROGRESS NOTE Subjective: Patient is alert, oriented, no Nausea, no Vomiting, yes passing gas, no Bowel Movement. Taking PO well. Denies SOB, Chest or Calf Pain. Using Incentive Spirometer, PAS in place. Ambulate WBAT, CPM 0-60 Patient reports pain as 4 on 0-10 scale  .    Objective: Vital signs in last 24 hours: Filed Vitals:   09/27/12 0000 09/27/12 0214 09/27/12 0354 09/27/12 0500  BP:  138/63  100/53  Pulse:  72  66  Temp:  97.9 F (36.6 C)  97.5 F (36.4 C)  TempSrc:  Oral  Oral  Resp: 20 18 18 18   SpO2: 98% 97% 97% 98%      Intake/Output from previous day: I/O last 3 completed shifts: In: 1260 [P.O.:60; I.V.:1200] Out: 3225 [Urine:2750; Drains:450; Blood:25]   Intake/Output this shift:     LABORATORY DATA:  Basename 09/27/12 0400  WBC 7.0  HGB 8.8*  HCT 26.7*  PLT 298  NA 137  K 3.1*  CL 100  CO2 27  BUN 6  CREATININE 0.56  GLUCOSE 107*  GLUCAP --  INR --  CALCIUM 8.6    Examination: Neurologically intact ABD soft Neurovascular intact Sensation intact distally Intact pulses distally Dorsiflexion/Plantar flexion intact Incision: no drainage No cellulitis present Compartment soft} Blood and plasma separated in drain indicating minimal recent drainage, drain pulled without difficulty.  Assessment:   1 Day Post-Op Procedure(s) (LRB): TOTAL KNEE ARTHROPLASTY (Left) ADDITIONAL DIAGNOSIS:    Plan: PT/OT WBAT, CPM 5/hrs day until ROM 0-90 degrees, then D/C CPM DVT Prophylaxis:  SCDx72hrs, ASA 325 mg BID x 2 weeks DISCHARGE PLAN: Home, after patient passes PT, probably tomorrow DISCHARGE NEEDS: HHPT, HHRN, CPM, Walker and 3-in-1 comode seat     Damion Kant J 09/27/2012, 7:31 AM

## 2012-09-27 NOTE — Progress Notes (Signed)
Seen and agreed 09/27/2012 Caitlan Chauca Elizabeth PTA [phone] pager [phone] office    

## 2012-09-27 NOTE — Progress Notes (Signed)
Seen and agreed 09/27/2012 Fredrich Birks PTA [phone] pager [phone] office

## 2012-09-27 NOTE — Progress Notes (Signed)
Physical Therapy Treatment Patient Details Name: Kim Ponce MRN: [id number] DOB: 1951/07/27 Today's Date: 09/27/2012 Time: 1308-6578 PT Time Calculation (min): 27 min  PT Assessment / Plan / Recommendation Comments on Treatment Session  Pt with increase pain this session. Nursing in room with pain medication prior to treatment. Agreeable to in room ambulation. Will attempt long hall ambulation later today and progress exercises as tolerable.     Follow Up Recommendations  Home health PT;Supervision/Assistance - 24 hour     Does the patient have the potential to tolerate intense rehabilitation     Barriers to Discharge        Equipment Recommendations  Rolling walker with 5" wheels    Recommendations for Other Services    Frequency 7X/week   Plan Discharge plan remains appropriate;Frequency remains appropriate    Precautions / Restrictions Precautions Precautions: Knee Restrictions Weight Bearing Restrictions: Yes LLE Weight Bearing: Weight bearing as tolerated   Pertinent Vitals/Pain 8/10. Meds prior to treatment    Mobility  Bed Mobility Supine to Sit: 4: Min guard Details for Bed Mobility Assistance: min Guard for safety. Pt able to self assist to manage LLE Transfers Transfers: Sit to Stand;Stand to Sit Sit to Stand: 4: Min guard Stand to Sit: 4: Min guard Details for Transfer Assistance: cueing for hand placement and controlling descent Ambulation/Gait Ambulation Distance (Feet): 30 Feet (15x2) Assistive device: Rolling walker Ambulation/Gait Assistance Details: Cueing to maintain upright posture and gait sequence Gait Pattern: Step-to pattern;Decreased step length - right    Exercises Total Joint Exercises Ankle Circles/Pumps: AROM;Both;15 reps;Supine Quad Sets: AROM;Both;15 reps;Supine Long Arc Quad: AROM;Left;10 reps;Limitations Long Arc Quad Limitations: limited ROM Knee Flexion: AAROM;Left;15 reps   PT Diagnosis:    PT Problem List:   PT Treatment  Interventions:     PT Goals Acute Rehab PT Goals PT Goal: Supine/Side to Sit - Progress: Progressing toward goal PT Goal: Sit to Stand - Progress: Progressing toward goal PT Goal: Ambulate - Progress: Progressing toward goal PT Goal: Perform Home Exercise Program - Progress: Progressing toward goal  Visit Information  Last PT Received On: 09/27/12 Assistance Needed: +1    Subjective Data      Cognition  Cognition Overall Cognitive Status: Appears within functional limits for tasks assessed/performed Arousal/Alertness: Awake/alert Orientation Level: Appears intact for tasks assessed Behavior During Session: [facility] for tasks performed    Balance     End of Session PT - End of Session Equipment Utilized During Treatment: Gait belt Activity Tolerance: Patient tolerated treatment well Patient left: in chair;with call bell/phone within reach CPM Left Knee CPM Left Knee: Off   GP     Lazaro Arms 09/27/2012, 10:23 AM

## 2012-09-27 NOTE — Evaluation (Signed)
Occupational Therapy Evaluation Patient Details Name: Kim Ponce MRN: [id number] DOB: 09-05-1950 Today's Date: 09/27/2012 Time: 4098-1191 OT Time Calculation (min): 19 min  OT Assessment / Plan / Recommendation Clinical Impression  Pt presents to OT s/p L TKR. Pts pain limiting participation at this time. Pt will benefit from skilled OT to increase I with ADL activity and return to PLOF    OT Assessment  Patient needs continued OT Services    Follow Up Recommendations  Home health OT       Equipment Recommendations  3 in 1 bedside comode;Tub/shower bench;Other (comment) (Pt may be able to borrow these items)       Frequency  Min 3X/week    Precautions / Restrictions Precautions Precautions: Knee Restrictions Weight Bearing Restrictions: Yes LLE Weight Bearing: Weight bearing as tolerated       ADL  Upper Body Bathing: Simulated;Set up Where Assessed - Upper Body Bathing: Unsupported sitting Lower Body Bathing: Simulated;Moderate assistance Where Assessed - Lower Body Bathing: Supported sit to stand Upper Body Dressing: Simulated;Set up Where Assessed - Upper Body Dressing: Unsupported sitting Lower Body Dressing: Simulated;Moderate assistance Where Assessed - Lower Body Dressing: Supported sit to Pharmacist, hospital: Simulated;Moderate assistance Toilet Transfer Method: Sit to stand Toileting - Clothing Manipulation and Hygiene: Simulated;Minimal assistance Transfers/Ambulation Related to ADLs: Pt educated in technique for LB dressing, as well as OT did bring tub bench in for pt to see.  Educated on use as pt stated getting in and out of tub was a challenge before surgery.  Pt will inquire with friend in regards to borrowing one.    OT Diagnosis: Generalized weakness;Acute pain  OT Problem List: Decreased strength;Pain;Decreased activity tolerance OT Treatment Interventions: Self-care/ADL training;DME and/or AE instruction;Patient/family education   OT  Goals Acute Rehab OT Goals OT Goal Formulation: With patient Time For Goal Achievement: 10/11/12 ADL Goals Pt Will Perform Lower Body Dressing: with modified independence;Sit to stand from chair ADL Goal: Lower Body Dressing - Progress: Goal set today Pt Will Transfer to Toilet: with modified independence;Comfort height toilet ADL Goal: Toilet Transfer - Progress: Goal set today Pt Will Perform Toileting - Clothing Manipulation: with modified independence;Standing ADL Goal: Toileting - Clothing Manipulation - Progress: Goal set today Pt Will Perform Tub/Shower Transfer: Transfer tub bench;Tub transfer;with supervision ADL Goal: Tub/Shower Transfer - Progress: Goal set today  Visit Information  Last OT Received On: 09/27/12 Assistance Needed: +1 Reason Eval/Treat Not Completed: Pain limiting ability to participate    Subjective Data  Subjective: I am in quite a bit of pain ( RN brought meds when OT in room)   Prior Functioning     Home Living Lives With: Alone Available Help at Discharge: Family;Friend(s);Available 24 hours/day (Sister coming to stay with patient.) Type of Home: Apartment Home Access: Level entry Home Layout: One level Bathroom Shower/Tub: Engineer, manufacturing systems: Standard Bathroom Accessibility: Yes How Accessible: Accessible via walker Home Adaptive Equipment: Crutches;Straight cane Additional Comments: Pt is going to inquire with friend regarding possible borrowing 3 n1 and a tub seat. Prior Function Level of Independence: Independent with assistive device(s);Needs assistance (using crutches or cane) Needs Assistance: Light Housekeeping Light Housekeeping: Maximal Able to Take Stairs?: No Driving: No Vocation: Unemployed Communication Communication: No difficulties            Cognition  Cognition Overall Cognitive Status: Appears within functional limits for tasks assessed/performed Arousal/Alertness: Awake/alert Orientation Level:  Appears intact for tasks assessed Behavior During Session: [facility] for tasks performed  Extremity/Trunk Assessment Right Upper Extremity Assessment RUE ROM/Strength/Tone: Deficits RUE ROM/Strength/Tone Deficits: strength 3/5 RUE Sensation: Deficits RUE Sensation Deficits: strength 3/5     Mobility Bed Mobility Bed Mobility: Not assessed Supine to Sit: 4: Min guard Details for Bed Mobility Assistance: min Guard for safety. Pt able to self assist to manage LLE Transfers Transfers: Sit to Stand;Stand to Sit Sit to Stand: 4: Min assist;With upper extremity assist Stand to Sit: 4: Min assist;With upper extremity assist;To chair/3-in-1 Details for Transfer Assistance: Pain limiting pts participation this OT visit.             End of Session OT - End of Session Activity Tolerance: Patient limited by pain Patient left: in chair;with call bell/phone within reach Nurse Communication: Patient requests pain meds CPM Left Knee CPM Left Knee: Off  GO     Alba Cory 09/27/2012, 11:54 AM

## 2012-09-27 NOTE — Progress Notes (Signed)
Physical Therapy Treatment Patient Details Name: Kim Ponce MRN: [id number] DOB: [birth year] Today's Date: 09/27/2012 Time: 4098-1191 PT Time Calculation (min): 30 min  PT Assessment / Plan / Recommendation Comments on Treatment Session  Pt continues to progress with therapy.Increased distance and able to progress to step through gait sequence. Will continue POC and progress as tolerable.     Follow Up Recommendations  Home health PT;Supervision/Assistance - 24 hour                 Equipment Recommendations  Rolling walker with 5" wheels    Recommendations for Other Services    Frequency 7X/week   Plan Discharge plan remains appropriate;Frequency remains appropriate    Precautions / Restrictions Precautions Precautions: Knee Restrictions LLE Weight Bearing: Weight bearing as tolerated       Mobility  Bed Mobility Bed Mobility: Not assessed Supine to Sit: 4: Min guard Sit to Supine: 4: Min guard Details for Bed Mobility Assistance: min Guard for safety. Pt able to self assist to manage LLE Transfers Transfers: Sit to Stand;Stand to Sit Sit to Stand: 4: Min guard;With upper extremity assist;From bed Stand to Sit: 4: Min guard;To bed;With upper extremity assist Details for Transfer Assistance: Pain limiting pts participation this OT visit.   Ambulation/Gait Ambulation/Gait Assistance: 5: Supervision Ambulation Distance (Feet): 200 Feet Assistive device: Rolling walker Ambulation/Gait Assistance Details: Able to progress to step through with min cueing. Pt attempting equal step length. Gait Pattern: Step-to pattern;Step-through pattern    Exercises Total Joint Exercises Ankle Circles/Pumps: AROM;Both;15 reps;Supine Quad Sets: AROM;Both;15 reps;Supine Heel Slides: AROM;10 reps;Left;Supine Hip ABduction/ADduction: AROM;Left;Supine;10 reps Straight Leg Raises: AAROM;Left;10 reps;Supine Long Arc Quad: AROM;Left;Seated;15 reps Long Arc Quad Limitations: limited  ROM Knee Flexion: AAROM;Left;15 reps      PT Goals Acute Rehab PT Goals PT Goal: Supine/Side to Sit - Progress: Progressing toward goal PT Goal: Sit to Stand - Progress: Progressing toward goal PT Goal: Ambulate - Progress: Met PT Goal: Perform Home Exercise Program - Progress: Progressing toward goal  Visit Information  Last PT Received On: 09/27/12 Assistance Needed: +1    Subjective Data      Cognition  Cognition Overall Cognitive Status: Appears within functional limits for tasks assessed/performed Arousal/Alertness: Awake/alert Orientation Level: Appears intact for tasks assessed Behavior During Session: [facility] for tasks performed    Balance     End of Session PT - End of Session Equipment Utilized During Treatment: Gait belt Activity Tolerance: Patient tolerated treatment well Patient left: in bed;with call bell/phone within reach CPM Left Knee CPM Left Knee: On Left Knee Flexion (Degrees): 65  Left Knee Extension (Degrees): 0    GP     Lillia Mountain, Mohd. Derflinger 09/27/2012, 1:48 PM

## 2012-09-27 NOTE — Care Management Note (Signed)
    Page 1 of 1   09/27/2012     2:15:18 PM   CARE MANAGEMENT NOTE 09/27/2012  Patient:  Kim Ponce, Kim Ponce   Account Number:  [id number]  Date Initiated:  09/26/2012  Documentation initiated by:  Anette Guarneri  Subjective/Objective Assessment:   left TKA  SNF/[facility] vs Home w/HH     Action/Plan:   follow progess w/PT to determine SNF vs HH  2/4 d/c plan is for Home w/HH services   Anticipated DC Date:  09/28/2012   Anticipated DC Plan:  HOME W HOME HEALTH SERVICES      DC Planning Services  CM consult      Choice offered to / List presented to:             Status of service:  Completed, signed off Medicare Important Message given?  NO (If response is "NO", the following Medicare IM given date fields will be blank) Date Medicare IM given:   Date Additional Medicare IM given:    Discharge Disposition:  HOME W HOME HEALTH SERVICES  Per UR Regulation:    If discussed at Long Length of Stay Meetings, dates discussed:    Comments:  09/27/12 14:12 Anette Guarneri RN/CM DME(RW/3n1) arranged with [facility] prior to admission, RW has already been delivered to room CPM and 3n1 to be delivered to home, patient plans to borrow tub bench from friend  09/26/12  15:43  Anette Guarneri RN/CM Patient was prearranged w/[facility] for [facility] services if needed If SNF needed, plan for [facility]

## 2012-09-28 ENCOUNTER — Encounter (HOSPITAL_COMMUNITY): Payer: Self-pay | Admitting: Orthopedic Surgery

## 2012-09-28 DIAGNOSIS — M1712 Unilateral primary osteoarthritis, left knee: Secondary | ICD-10-CM | POA: Diagnosis present

## 2012-09-28 LAB — CBC
Hemoglobin: 8.2 g/dL — ABNORMAL LOW (ref 12.0–15.0)
MCH: 30.4 pg (ref 26.0–34.0)
MCHC: 31.9 g/dL (ref 30.0–36.0)
Platelets: 273 10*3/uL (ref 150–400)

## 2012-09-28 MED ORDER — METHOCARBAMOL 500 MG PO TABS: 500.0000 mg | ORAL_TABLET | Freq: Four times a day (QID) | ORAL | Status: DC | PRN

## 2012-09-28 MED ORDER — ZOLPIDEM TARTRATE 5 MG PO TABS: 5.0000 mg | ORAL_TABLET | Freq: Every evening | ORAL | Status: DC | PRN

## 2012-09-28 MED ORDER — ASPIRIN 325 MG PO TBEC: 325.0000 mg | DELAYED_RELEASE_TABLET | Freq: Two times a day (BID) | ORAL | Status: DC

## 2012-09-28 MED ORDER — OXYCODONE-ACETAMINOPHEN 5-325 MG PO TABS: 1.0000 | ORAL_TABLET | ORAL | Status: DC | PRN

## 2012-09-28 NOTE — Progress Notes (Signed)
Physical Therapy Treatment Patient Details Name: Kim Ponce MRN: [id number] DOB: [birth year] Today's Date: 09/28/2012 Time: 4098-1191 PT Time Calculation (min): 27 min  PT Assessment / Plan / Recommendation Comments on Treatment Session  Pt doing very well with mobility and exercises.  She did have some increased pain following ambulation.  RN notified.  Ready for D/C today.     Follow Up Recommendations  Home health PT;Supervision/Assistance - 24 hour     Does the patient have the potential to tolerate intense rehabilitation     Barriers to Discharge        Equipment Recommendations  Rolling walker with 5" wheels    Recommendations for Other Services    Frequency 7X/week   Plan Discharge plan remains appropriate;Frequency remains appropriate    Precautions / Restrictions Precautions Precautions: Knee Restrictions Weight Bearing Restrictions: Yes LLE Weight Bearing: Weight bearing as tolerated   Pertinent Vitals/Pain 7/10 pain, RN notified and Ice pack applied    Mobility  Bed Mobility Bed Mobility: Supine to Sit Supine to Sit: 4: Min guard Details for Bed Mobility Assistance: Min/guard to ensure safety of LLE out of bed with very min cues for technique.  Transfers Transfers: Sit to Stand;Stand to Sit Sit to Stand: 4: Min guard;With upper extremity assist;From bed;From chair/3-in-1 Stand to Sit: 4: Min guard;With upper extremity assist;With armrests;To chair/3-in-1 Details for Transfer Assistance: Performed x 2 in order to use 3in1 in restroom.  Min cues for hand placement and LE management when sitting/standing.  Ambulation/Gait Ambulation/Gait Assistance: 5: Supervision Ambulation Distance (Feet): 150 Feet Assistive device: Rolling walker Ambulation/Gait Assistance Details: Min cues for step through vs step to gait pattern and to maintain upright posture.  Gait Pattern: Step-to pattern;Step-through pattern    Exercises Total Joint Exercises Ankle Circles/Pumps:  AROM;Both;20 reps Quad Sets: AROM;Left;10 reps Heel Slides: AAROM;Left;10 reps Hip ABduction/ADduction: AAROM;Left;10 reps Straight Leg Raises: AAROM;Left;10 reps;Supine   PT Diagnosis:    PT Problem List:   PT Treatment Interventions:     PT Goals Acute Rehab PT Goals PT Goal Formulation: With patient Time For Goal Achievement: 10/03/12 Potential to Achieve Goals: Good Pt will go Supine/Side to Sit: Independently;with HOB 0 degrees PT Goal: Supine/Side to Sit - Progress: Progressing toward goal Pt will go Sit to Stand: with supervision;with upper extremity assist PT Goal: Sit to Stand - Progress: Progressing toward goal Pt will Ambulate: >150 feet;with supervision;with rolling walker PT Goal: Ambulate - Progress: Met Pt will Perform Home Exercise Program: with supervision, verbal cues required/provided PT Goal: Perform Home Exercise Program - Progress: Met  Visit Information  Last PT Received On: 09/28/12 Assistance Needed: +1    Subjective Data  Subjective: I'm ready to head home today.  Patient Stated Goal: To walk.  To decrease pain.   Cognition  Cognition Overall Cognitive Status: Appears within functional limits for tasks assessed/performed Arousal/Alertness: Awake/alert Orientation Level: Appears intact for tasks assessed Behavior During Session: [facility] for tasks performed    Balance     End of Session PT - End of Session Activity Tolerance: Patient tolerated treatment well;Patient limited by pain Patient left: in chair;with call bell/phone within reach Nurse Communication: Mobility status;Patient requests pain meds   GP     Vista Deck 09/28/2012, 8:39 AM

## 2012-09-28 NOTE — Progress Notes (Signed)
Discharge instructions completed with patient and friend. Prescriptions given. All questions answered. Informed about follow up appointment and dressing changes.

## 2012-09-28 NOTE — Discharge Summary (Signed)
Patient ID: Kim Ponce MRN: [id number] DOB/AGE: 09/28/1950 62 y.o.  Admit date: 09/26/2012 Discharge date: 09/28/2012  Admission Diagnoses:  Principal Problem:  *Osteoarthritis of left knee   Discharge Diagnoses:  Same  Past Medical History  Diagnosis Date  . Arthritis     Surgeries: Procedure(s): TOTAL KNEE ARTHROPLASTY on 09/26/2012   Consultants:    Discharged Condition: Improved  Hospital Course: Kim Ponce is an 62 y.o. female who was admitted 09/26/2012 for operative treatment ofOsteoarthritis of left knee. Patient has severe unremitting pain that affects sleep, daily activities, and work/hobbies. After pre-op clearance the patient was taken to the operating room on 09/26/2012 and underwent  Procedure(s): TOTAL KNEE ARTHROPLASTY.    Patient was given perioperative antibiotics: Anti-infectives     Start     Dose/Rate Route Frequency Ordered Stop   09/26/12 1138   cefUROXime (ZINACEF) injection  Status:  Discontinued          As needed 09/26/12 1138 09/26/12 1223   09/26/12 0600   vancomycin (VANCOCIN) IVPB 1000 mg/200 mL premix        1,000 mg 200 mL/hr over 60 Minutes Intravenous On call to O.R. 09/25/12 1216 09/26/12 1040           Patient was given sequential compression devices, early ambulation, and chemoprophylaxis to prevent DVT.  Patient benefited maximally from hospital stay and there were no complications.    Recent vital signs: Patient Vitals for the past 24 hrs:  BP Temp Temp src Pulse Resp SpO2 Height Weight  09/28/12 0614 120/65 mmHg 100 F (37.8 C) Oral 76  16  98 % - -  09/28/12 0400 - - - - 16  98 % - -  09/28/12 0000 - - - - 18  98 % - -  10-01-2012 2115 - 98.9 F (37.2 C) - - - - - -  10/01/2012 2015 - - - - - - 5\' 6"  (1.676 m) 83 kg (182 lb 15.7 oz)  10/01/12 2006 123/59 mmHg 101.4 F (38.6 C) Oral 72  16  97 % - -  10-01-2012 2000 - - - - 20  99 % - -  10-01-2012 1600 - - - - 16  98 % - -  October 01, 2012 1200 - - - - 18  98 % - -  2012-10-01 1145  114/56 mmHg 98.5 F (36.9 C) Oral 69  18  - - -     Recent laboratory studies:  Basename 09/28/12 0610 10-01-12 0400  WBC 6.6 7.0  HGB 8.2* 8.8*  HCT 25.7* 26.7*  PLT 273 298  NA -- 137  K -- 3.1*  CL -- 100  CO2 -- 27  BUN -- 6  CREATININE -- 0.56  GLUCOSE -- 107*  INR -- --  CALCIUM -- 8.6     Discharge Medications:     Medication List     As of 09/28/2012  8:06 AM    STOP taking these medications         HYDROcodone-acetaminophen 5-325 MG per tablet   Commonly known as: NORCO/VICODIN      ibuprofen 200 MG tablet   Commonly known as: ADVIL,MOTRIN      TAKE these medications         aspirin 325 MG EC tablet   Take 1 tablet (325 mg total) by mouth 2 (two) times daily.      celecoxib 200 MG capsule   Commonly known as: CELEBREX   Take 200 mg by mouth daily.  methocarbamol 500 MG tablet   Commonly known as: ROBAXIN   Take 1 tablet (500 mg total) by mouth every 6 (six) hours as needed.      oxyCODONE-acetaminophen 5-325 MG per tablet   Commonly known as: PERCOCET/ROXICET   Take 1-2 tablets by mouth every 4 (four) hours as needed for pain.      zolpidem 5 MG tablet   Commonly known as: AMBIEN   Take 1 tablet (5 mg total) by mouth at bedtime as needed for sleep.        Diagnostic Studies: Dg Chest 2 View  09/21/2012  *RADIOLOGY REPORT*  Clinical Data: Preoperative respiratory films.  CHEST - 2 VIEW  Comparison: None.  Findings: Minimal linear atelectasis or scar is seen in the left lung base.  The lungs are otherwise clear.  Heart size is normal. No pneumothorax or pleural fluid.  No focal bony abnormality.  IMPRESSION: No acute disease.   Original Report Authenticated By: Holley Dexter, M.D.     Disposition: Final discharge disposition not confirmed      Discharge Orders    Future Orders Please Complete By Expires   Increase activity slowly      Walker       May shower / Bathe      Driving Restrictions      Comments:   No driving for 2  weeks.   Change dressing (specify)      Comments:   Dressing change as needed.   Call MD for:  temperature >100.4      Call MD for:  severe uncontrolled pain      Call MD for:  redness, tenderness, or signs of infection (pain, swelling, redness, odor or green/yellow discharge around incision site)      Discharge instructions      Comments:   F/U with Dr. Turner Daniels as scheduled.         SignedHazle Nordmann. 09/28/2012, 8:06 AM

## 2012-09-28 NOTE — Progress Notes (Signed)
PATIENT ID: Kim Ponce  MRN: [id number]  DOB/AGE:  1950/11/10 / 62 y.o.  2 Days Post-Op Procedure(s) (LRB): TOTAL KNEE ARTHROPLASTY (Left)    PROGRESS NOTE Subjective: Patient is alert, oriented, no Nausea, no Vomiting, yes passing gas, no Bowel Movement. Taking PO well. Denies SOB, Chest or Calf Pain. Using Incentive Spirometer, PAS in place. Ambulating well with PT. Patient reports pain as moderate  .    Objective: Vital signs in last 24 hours: Filed Vitals:   09/27/12 2115 09/28/12 0000 09/28/12 0400 09/28/12 0614  BP:    120/65  Pulse:    76  Temp: 98.9 F (37.2 C)   100 F (37.8 C)  TempSrc:    Oral  Resp:  18 16 16   Height:      Weight:      SpO2:  98% 98% 98%      Intake/Output from previous day: I/O last 3 completed shifts: In: 5460 [P.O.:960; I.V.:4500] Out: 2550 [Urine:2400; Drains:150]   Intake/Output this shift:     LABORATORY DATA:  Basename 09/28/12 0610 09/27/12 0400  WBC 6.6 7.0  HGB 8.2* 8.8*  HCT 25.7* 26.7*  PLT 273 298  NA -- 137  K -- 3.1*  CL -- 100  CO2 -- 27  BUN -- 6  CREATININE -- 0.56  GLUCOSE -- 107*  GLUCAP -- --  INR -- --  CALCIUM -- 8.6    Examination: Neurologically intact ABD soft Neurovascular intact Sensation intact distally Intact pulses distally Dorsiflexion/Plantar flexion intact Incision: dressing C/D/I}  Assessment:   2 Days Post-Op Procedure(s) (LRB): TOTAL KNEE ARTHROPLASTY (Left) ADDITIONAL DIAGNOSIS:  Acute Blood Loss Anemia - asymptomatic  Plan: PT/OT WBAT, CPM 5/hrs day until ROM 0-90 degrees, then D/C CPM DVT Prophylaxis:  SCDx72hrs, ASA 325 mg BID x 2 weeks DISCHARGE PLAN: Home today DISCHARGE NEEDS: HHPT, HHRN, CPM, Walker and 3-in-1 comode seat     [place],Mayar Whittier M. 09/28/2012, 8:02 AM

## 2013-11-08 ENCOUNTER — Encounter (HOSPITAL_BASED_OUTPATIENT_CLINIC_OR_DEPARTMENT_OTHER): Payer: Self-pay | Admitting: *Deleted

## 2013-11-08 ENCOUNTER — Other Ambulatory Visit: Payer: Self-pay | Admitting: Orthopedic Surgery

## 2013-11-08 NOTE — Progress Notes (Signed)
No labs needed

## 2013-11-10 NOTE — H&P (Signed)
Kim Ponce is an 63 y.o. female.   Chief Complaint: Left Shoulder pain  HPI:  Patient returns today reporting that her left shoulder pain.  If anything is gotten a little bit worse and her carpal tunnel on the left side which has some mild thenar atrophy is stable.  She is carefully with her options she would like to proceed with left shoulder rotator cuff repair and left carpal tunnel release simultaneously.  Past Medical History  Diagnosis Date  . Arthritis   . Wears glasses     Past Surgical History  Procedure Laterality Date  . Abdominal hysterectomy    . Back surgery      lumbar disectomy  . 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 Total knee arthroplasty  09/26/2012    Procedure: TOTAL KNEE ARTHROPLASTY;  Surgeon: Nestor Lewandowsky, MD;  Location: [facility] OR;  Service: Orthopedics;  Laterality: Left;    History reviewed. No pertinent family history. Social History:  reports that she has never smoked. She has never used smokeless tobacco. She reports that she drinks about 2.4 ounces of alcohol per week. She reports that she does not use illicit drugs.  Allergies:  Allergies  Allergen Reactions  . Morphine And Related Hives  . Penicillins Other (See Comments)    REACTION:  unknown  . Sulfa Antibiotics Other (See Comments)    REACTION:  unknown    No prescriptions prior to admission    No results found for this or any previous visit (from the past 48 hour(s)). No results found.  Review of Systems  Constitutional: Negative.   HENT: Negative.   Eyes: Negative.   Respiratory: Negative.   Cardiovascular: Negative.   Gastrointestinal: Negative.   Genitourinary: Negative.   Musculoskeletal: Positive for joint pain.  Skin: Negative.   Neurological: Negative.   Endo/Heme/Allergies: Negative.   Psychiatric/Behavioral: Negative.     Height 5\' 6"  (1.676 m), weight 64.411 kg (142 lb). Physical Exam  Constitutional: She is oriented to person,  place, and time. She appears well-developed and well-nourished.  HENT:  Head: Normocephalic and atraumatic.  Eyes: Pupils are equal, round, and reactive to light.  Neck: Normal range of motion. Neck supple.  Cardiovascular: Intact distal pulses.   Respiratory: Effort normal.  Musculoskeletal: She exhibits tenderness.  She continues have rotator cuff weakness on the left side to abduction with internal rotation impingement signs are positive and to recap her MRI scan did show a supraspinatus near full-thickness tear, retracted about 2 cm.  Phalen's test remains positive.  She has mild thenar atrophy.  The skin over the shoulder and wrist is intact there is no sign of infection.  Neurological: She is alert and oriented to person, place, and time.  Skin: Skin is warm and dry.  Psychiatric: She has a normal mood and affect. Her behavior is normal. Judgment and thought content normal.     Assessment/Plan Assess: Left shoulder rotator cuff tear, loose body in the glenohumeral joint, type II subacromial spur.  Also left carpal tunnel syndrome with mild thenar atrophy.  Plan: The risks and benefits of rotator cuff repair with arthroscopy to remove the loose body and evaluate the rotator cuff were discussed with the patient.  Concomitant with left carpal tunnel release.  We will get this arranged for her at her convenience.  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  I will see her back at the time of surgical intervention which will be done one of the day surgery centers.  Coulson Wehner R 11/10/2013, 12:50 PM

## 2013-11-13 ENCOUNTER — Ambulatory Visit (HOSPITAL_BASED_OUTPATIENT_CLINIC_OR_DEPARTMENT_OTHER): Payer: PRIVATE HEALTH INSURANCE | Admitting: Anesthesiology

## 2013-11-13 ENCOUNTER — Encounter (HOSPITAL_BASED_OUTPATIENT_CLINIC_OR_DEPARTMENT_OTHER): Payer: Self-pay | Admitting: Anesthesiology

## 2013-11-13 ENCOUNTER — Ambulatory Visit (HOSPITAL_BASED_OUTPATIENT_CLINIC_OR_DEPARTMENT_OTHER)
Admission: RE | Admit: 2013-11-13 | Discharge: 2013-11-13 | Disposition: A | Discharge status: Home / Self Care | Payer: PRIVATE HEALTH INSURANCE | Source: Ambulatory Visit | Attending: Orthopedic Surgery | Admitting: Orthopedic Surgery

## 2013-11-13 ENCOUNTER — Encounter (HOSPITAL_BASED_OUTPATIENT_CLINIC_OR_DEPARTMENT_OTHER)
Admission: RE | Disposition: A | Discharge status: Home / Self Care | Payer: Self-pay | Source: Ambulatory Visit | Attending: Orthopedic Surgery

## 2013-11-13 ENCOUNTER — Encounter (HOSPITAL_BASED_OUTPATIENT_CLINIC_OR_DEPARTMENT_OTHER): Payer: PRIVATE HEALTH INSURANCE | Admitting: Anesthesiology

## 2013-11-13 DIAGNOSIS — M719 Bursopathy, unspecified: Principal | ICD-10-CM | POA: Insufficient documentation

## 2013-11-13 DIAGNOSIS — G5600 Carpal tunnel syndrome, unspecified upper limb: Secondary | ICD-10-CM | POA: Insufficient documentation

## 2013-11-13 DIAGNOSIS — M67919 Unspecified disorder of synovium and tendon, unspecified shoulder: Secondary | ICD-10-CM | POA: Insufficient documentation

## 2013-11-13 DIAGNOSIS — M7580 Other shoulder lesions, unspecified shoulder: Secondary | ICD-10-CM

## 2013-11-13 DIAGNOSIS — M25819 Other specified joint disorders, unspecified shoulder: Secondary | ICD-10-CM | POA: Insufficient documentation

## 2013-11-13 DIAGNOSIS — M19019 Primary osteoarthritis, unspecified shoulder: Secondary | ICD-10-CM | POA: Insufficient documentation

## 2013-11-13 DIAGNOSIS — M7542 Impingement syndrome of left shoulder: Secondary | ICD-10-CM

## 2013-11-13 DIAGNOSIS — M75100 Unspecified rotator cuff tear or rupture of unspecified shoulder, not specified as traumatic: Secondary | ICD-10-CM

## 2013-11-13 HISTORY — PX: SHOULDER ARTHROSCOPY WITH ROTATOR CUFF REPAIR: SHX5685

## 2013-11-13 HISTORY — PX: CARPAL TUNNEL RELEASE: SHX101

## 2013-11-13 HISTORY — DX: Presence of spectacles and contact lenses: Z97.3

## 2013-11-13 LAB — POCT HEMOGLOBIN-HEMACUE: HEMOGLOBIN: 11.3 g/dL — AB (ref 12.0–15.0)

## 2013-11-13 SURGERY — ARTHROSCOPY, SHOULDER, WITH ROTATOR CUFF REPAIR
Anesthesia: Regional | Site: Wrist | Laterality: Left

## 2013-11-13 MED ORDER — OXYCODONE-ACETAMINOPHEN 5-325 MG PO TABS: 1.0000 | ORAL_TABLET | ORAL | Status: DC | PRN

## 2013-11-13 MED ORDER — ONDANSETRON HCL 4 MG/2ML IJ SOLN: 4.0000 mg | Freq: Four times a day (QID) | INTRAMUSCULAR | Status: DC | PRN

## 2013-11-13 MED ORDER — VANCOMYCIN HCL IN DEXTROSE 1-5 GM/200ML-% IV SOLN
INTRAVENOUS | Status: AC
  Filled 2013-11-13: qty 200

## 2013-11-13 MED ORDER — LACTATED RINGERS IV SOLN
INTRAVENOUS | Status: DC
  Administered 2013-11-13 (×2): via INTRAVENOUS

## 2013-11-13 MED ORDER — FENTANYL CITRATE 0.05 MG/ML IJ SOLN
50.0000 ug | INTRAMUSCULAR | Status: DC | PRN
  Administered 2013-11-13: 100 ug via INTRAVENOUS

## 2013-11-13 MED ORDER — DEXTROSE-NACL 5-0.45 % IV SOLN: INTRAVENOUS | Status: DC

## 2013-11-13 MED ORDER — FENTANYL CITRATE 0.05 MG/ML IJ SOLN
INTRAMUSCULAR | Status: AC
  Filled 2013-11-13: qty 4

## 2013-11-13 MED ORDER — FENTANYL CITRATE 0.05 MG/ML IJ SOLN
INTRAMUSCULAR | Status: AC
  Filled 2013-11-13: qty 2

## 2013-11-13 MED ORDER — CHLORHEXIDINE GLUCONATE 4 % EX LIQD: 60.0000 mL | Freq: Once | CUTANEOUS | Status: DC

## 2013-11-13 MED ORDER — MIDAZOLAM HCL 2 MG/2ML IJ SOLN
INTRAMUSCULAR | Status: AC
  Filled 2013-11-13: qty 2

## 2013-11-13 MED ORDER — METOCLOPRAMIDE HCL 5 MG PO TABS: 5.0000 mg | ORAL_TABLET | Freq: Three times a day (TID) | ORAL | Status: DC | PRN

## 2013-11-13 MED ORDER — ONDANSETRON HCL 4 MG PO TABS: 4.0000 mg | ORAL_TABLET | Freq: Four times a day (QID) | ORAL | Status: DC | PRN

## 2013-11-13 MED ORDER — ONDANSETRON HCL 4 MG/2ML IJ SOLN
INTRAMUSCULAR | Status: DC | PRN
  Administered 2013-11-13: 4 mg via INTRAVENOUS

## 2013-11-13 MED ORDER — MIDAZOLAM HCL 5 MG/5ML IJ SOLN
INTRAMUSCULAR | Status: DC | PRN
  Administered 2013-11-13: 2 mg via INTRAVENOUS

## 2013-11-13 MED ORDER — EPINEPHRINE HCL 1 MG/ML IJ SOLN
INTRAMUSCULAR | Status: AC
  Filled 2013-11-13: qty 1

## 2013-11-13 MED ORDER — BUPIVACAINE-EPINEPHRINE PF 0.5-1:200000 % IJ SOLN
INTRAMUSCULAR | Status: DC | PRN
  Administered 2013-11-13: 30 mL via PERINEURAL

## 2013-11-13 MED ORDER — SUCCINYLCHOLINE CHLORIDE 20 MG/ML IJ SOLN
INTRAMUSCULAR | Status: DC | PRN
  Administered 2013-11-13: 40 mg via INTRAVENOUS

## 2013-11-13 MED ORDER — PROPOFOL 10 MG/ML IV BOLUS
INTRAVENOUS | Status: DC | PRN
  Administered 2013-11-13: 100 mg via INTRAVENOUS

## 2013-11-13 MED ORDER — MIDAZOLAM HCL 2 MG/2ML IJ SOLN
1.0000 mg | INTRAMUSCULAR | Status: DC | PRN
  Administered 2013-11-13: 1 mg via INTRAVENOUS

## 2013-11-13 MED ORDER — DEXAMETHASONE SODIUM PHOSPHATE 4 MG/ML IJ SOLN
INTRAMUSCULAR | Status: DC | PRN
  Administered 2013-11-13: 10 mg via INTRAVENOUS

## 2013-11-13 MED ORDER — LIDOCAINE HCL (CARDIAC) 20 MG/ML IV SOLN
INTRAVENOUS | Status: DC | PRN
Start: 2013-11-13 — End: 2013-11-13
  Administered 2013-11-13: 40 mg via INTRAVENOUS

## 2013-11-13 MED ORDER — FENTANYL CITRATE 0.05 MG/ML IJ SOLN
INTRAMUSCULAR | Status: DC | PRN
  Administered 2013-11-13: 100 ug via INTRAVENOUS

## 2013-11-13 MED ORDER — VANCOMYCIN HCL IN DEXTROSE 1-5 GM/200ML-% IV SOLN
1000.0000 mg | INTRAVENOUS | Status: AC
  Administered 2013-11-13 (×2): 1000 mg via INTRAVENOUS

## 2013-11-13 MED ORDER — SODIUM CHLORIDE 0.9 % IR SOLN
Status: DC | PRN
  Administered 2013-11-13: 14:00:00

## 2013-11-13 MED ORDER — HYDROMORPHONE HCL PF 1 MG/ML IJ SOLN
0.2500 mg | INTRAMUSCULAR | Status: DC | PRN
Start: 2013-11-13 — End: 2013-11-13

## 2013-11-13 MED ORDER — METOCLOPRAMIDE HCL 5 MG/ML IJ SOLN: 5.0000 mg | Freq: Three times a day (TID) | INTRAMUSCULAR | Status: DC | PRN

## 2013-11-13 SURGICAL SUPPLY — 86 items
BLADE AVERAGE 25MMX9MM (BLADE)
BLADE AVERAGE 25X9 (BLADE) IMPLANT
BLADE CUTTER GATOR 3.5 (BLADE) ×4 IMPLANT
BLADE GREAT WHITE 4.2 (BLADE) ×3 IMPLANT
BLADE GREAT WHITE 4.2MM (BLADE) ×1
BLADE SURG 15 STRL LF DISP TIS (BLADE) ×2 IMPLANT
BLADE SURG 15 STRL SS (BLADE) ×2
BNDG ELASTIC 2 VLCR STRL LF (GAUZE/BANDAGES/DRESSINGS) ×4 IMPLANT
BNDG ESMARK 4X9 LF (GAUZE/BANDAGES/DRESSINGS) ×4 IMPLANT
BUR EGG 3PK/BX (BURR) IMPLANT
BUR VERTEX HOODED 4.5 (BURR) ×4 IMPLANT
CANISTER SUCT 3000ML (MISCELLANEOUS) IMPLANT
CANNULA 5.75X71 LONG (CANNULA) IMPLANT
CANNULA TWIST IN 8.25X7CM (CANNULA) IMPLANT
CORDS BIPOLAR (ELECTRODE) ×4 IMPLANT
COVER MAYO STAND STRL (DRAPES) IMPLANT
COVER TABLE BACK 60X90 (DRAPES) IMPLANT
CUFF TOURNIQUET SINGLE 18IN (TOURNIQUET CUFF) ×4 IMPLANT
DECANTER SPIKE VIAL GLASS SM (MISCELLANEOUS) IMPLANT
DRAPE EXTREMITY T 121X128X90 (DRAPE) IMPLANT
DRAPE INCISE IOBAN 66X45 STRL (DRAPES) IMPLANT
DRAPE SHOULDER BEACH CHAIR (DRAPES) ×4 IMPLANT
DRAPE STERI 35X30 U-POUCH (DRAPES) IMPLANT
DRAPE U-SHAPE 47X51 STRL (DRAPES) ×4 IMPLANT
DURAPREP 26ML APPLICATOR (WOUND CARE) ×4 IMPLANT
ELECT REM PT RETURN 9FT ADLT (ELECTROSURGICAL) ×4
ELECTRODE REM PT RTRN 9FT ADLT (ELECTROSURGICAL) ×2 IMPLANT
GAUZE XEROFORM 1X8 LF (GAUZE/BANDAGES/DRESSINGS) ×4 IMPLANT
GLOVE BIO SURGEON STRL SZ7.5 (GLOVE) ×4 IMPLANT
GLOVE BIO SURGEON STRL SZ8.5 (GLOVE) ×4 IMPLANT
GLOVE BIOGEL PI IND STRL 7.0 (GLOVE) ×4 IMPLANT
GLOVE BIOGEL PI IND STRL 8 (GLOVE) ×2 IMPLANT
GLOVE BIOGEL PI IND STRL 9 (GLOVE) ×2 IMPLANT
GLOVE BIOGEL PI INDICATOR 7.0 (GLOVE) ×4
GLOVE BIOGEL PI INDICATOR 8 (GLOVE) ×2
GLOVE BIOGEL PI INDICATOR 9 (GLOVE) ×2
GLOVE ECLIPSE 6.5 STRL STRAW (GLOVE) ×4 IMPLANT
GLOVE EXAM NITRILE MD LF STRL (GLOVE) ×4 IMPLANT
GOWN STRL REUS W/ TWL LRG LVL3 (GOWN DISPOSABLE) ×2 IMPLANT
GOWN STRL REUS W/ TWL LRG LVL4 (GOWN DISPOSABLE) ×2 IMPLANT
GOWN STRL REUS W/ TWL XL LVL3 (GOWN DISPOSABLE) ×2 IMPLANT
GOWN STRL REUS W/TWL LRG LVL3 (GOWN DISPOSABLE) ×2
GOWN STRL REUS W/TWL LRG LVL4 (GOWN DISPOSABLE) ×2
GOWN STRL REUS W/TWL XL LVL3 (GOWN DISPOSABLE) ×2
GOWN STRL REUS W/TWL XL LVL4 (GOWN DISPOSABLE) ×4 IMPLANT
IV NS IRRIG 3000ML ARTHROMATIC (IV SOLUTION) IMPLANT
MANIFOLD NEPTUNE II (INSTRUMENTS) ×4 IMPLANT
NDL SAFETY ECLIPSE 18X1.5 (NEEDLE) ×4 IMPLANT
NDL SUT 6 .5 CRC .975X.05 MAYO (NEEDLE) IMPLANT
NEEDLE HYPO 18GX1.5 SHARP (NEEDLE) ×4
NEEDLE HYPO 22GX1.5 SAFETY (NEEDLE) IMPLANT
NEEDLE MAYO TAPER (NEEDLE)
NS IRRIG 1000ML POUR BTL (IV SOLUTION) IMPLANT
PACK ARTHROSCOPY DSU (CUSTOM PROCEDURE TRAY) ×4 IMPLANT
PACK BASIN DAY SURGERY FS (CUSTOM PROCEDURE TRAY) ×4 IMPLANT
PADDING CAST ABS 4INX4YD NS (CAST SUPPLIES)
PADDING CAST ABS COTTON 4X4 ST (CAST SUPPLIES) IMPLANT
PADDING UNDERCAST 2  STERILE (CAST SUPPLIES) ×4 IMPLANT
PASSER SUT SWANSON 36MM LOOP (INSTRUMENTS) IMPLANT
PENCIL BUTTON HOLSTER BLD 10FT (ELECTRODE) IMPLANT
SET IRRIG Y TYPE TUR BLADDER L (SET/KITS/TRAYS/PACK) ×4 IMPLANT
SLEEVE SCD COMPRESS KNEE MED (MISCELLANEOUS) ×4 IMPLANT
SLING ARM IMMOBILIZER LRG (SOFTGOODS) IMPLANT
SLING ARM LRG ADULT FOAM STRAP (SOFTGOODS) IMPLANT
SLING ARM MED ADULT FOAM STRAP (SOFTGOODS) ×4 IMPLANT
SPONGE GAUZE 4X4 12PLY (GAUZE/BANDAGES/DRESSINGS) ×4 IMPLANT
SPONGE GAUZE 4X4 12PLY STER LF (GAUZE/BANDAGES/DRESSINGS) ×8 IMPLANT
SPONGE LAP 4X18 X RAY DECT (DISPOSABLE) IMPLANT
SUCTION FRAZIER TIP 10 FR DISP (SUCTIONS) IMPLANT
SUT ETHIBOND 2 OS 4 DA (SUTURE) IMPLANT
SUT ETHILON 4 0 PS 2 18 (SUTURE) ×4 IMPLANT
SUT MNCRL AB 4-0 PS2 18 (SUTURE) IMPLANT
SUT MON AB 4-0 PC3 18 (SUTURE) IMPLANT
SUT VIC AB 3-0 PS1 18 (SUTURE)
SUT VIC AB 3-0 PS1 18XBRD (SUTURE) IMPLANT
SYR 5ML LL (SYRINGE) ×4 IMPLANT
SYR BULB 3OZ (MISCELLANEOUS) IMPLANT
SYR CONTROL 10ML LL (SYRINGE) IMPLANT
SYR TB 1ML LL NO SAFETY (SYRINGE) ×8 IMPLANT
TAPE PAPER 3X10 WHT MICROPORE (GAUZE/BANDAGES/DRESSINGS) IMPLANT
TOWEL OR 17X24 6PK STRL BLUE (TOWEL DISPOSABLE) ×4 IMPLANT
TUBE CONNECTING 20'X1/4 (TUBING)
TUBE CONNECTING 20X1/4 (TUBING) IMPLANT
UNDERPAD 30X30 INCONTINENT (UNDERPADS AND DIAPERS) ×4 IMPLANT
WAND STAR VAC 90 (SURGICAL WAND) ×4 IMPLANT
WATER STERILE IRR 1000ML POUR (IV SOLUTION) ×4 IMPLANT

## 2013-11-13 NOTE — Discharge Instructions (Signed)
°  Post Anesthesia Home Care Instructions ° °Activity: °Get plenty of rest for the remainder of the day. A responsible adult should stay with you for 24 hours following the procedure.  °For the next 24 hours, DO NOT: °-Drive a car °-Operate machinery °-Drink alcoholic beverages °-Take any medication unless instructed by your physician °-Make any legal decisions or sign important papers. ° °Meals: °Start with liquid foods such as gelatin or soup. Progress to regular foods as tolerated. Avoid greasy, spicy, heavy foods. If nausea and/or vomiting occur, drink only clear liquids until the nausea and/or vomiting subsides. Call your physician if vomiting continues. ° °Special Instructions/Symptoms: °Your throat may feel dry or sore from the anesthesia or the breathing tube placed in your throat during surgery. If this causes discomfort, gargle with warm salt water. The discomfort should disappear within 24 hours. ° °Regional Anesthesia Blocks ° °1. Numbness or the inability to move the "blocked" extremity may last from 3-48 hours after placement. The length of time depends on the medication injected and your individual response to the medication. If the numbness is not going away after 48 hours, call your surgeon. ° °2. The extremity that is blocked will need to be protected until the numbness is gone and the  Strength has returned. Because you cannot feel it, you will need to take extra care to avoid injury. Because it may be weak, you may have difficulty moving it or using it. You may not know what position it is in without looking at it while the block is in effect. ° °3. For blocks in the legs and feet, returning to weight bearing and walking needs to be done carefully. You will need to wait until the numbness is entirely gone and the strength has returned. You should be able to move your leg and foot normally before you try and bear weight or walk. You will need someone to be with you when you first try to ensure you  do not fall and possibly risk injury. ° °4. Bruising and tenderness at the needle site are common side effects and will resolve in a few days. ° °5. Persistent numbness or new problems with movement should be communicated to the surgeon or the [facility] ([phone])/ [facility] ([phone]). °

## 2013-11-13 NOTE — Anesthesia Postprocedure Evaluation (Signed)
  Anesthesia Post-op Note  Patient: Kim Ponce  Procedure(s) Performed: Procedure(s): LEFT SHOULDER ARTHROSCOPY WITH DISTAL CLAVICLE ACROMIOPLASTY, LABRAL AND PARTIAL ROTATOR CUFF TEAR DEBRIDEMENT, SUBACROMIAL DECOMPRESSION (Left) LEFT CARPAL TUNNEL RELEASE (Left)  Patient Location: PACU  Anesthesia Type:General and block  Level of Consciousness: awake and alert   Airway and Oxygen Therapy: Patient Spontanous Breathing  Post-op Pain: none  Post-op Assessment: Post-op Vital signs reviewed, Patient's Cardiovascular Status Stable and Respiratory Function Stable  Post-op Vital Signs: Reviewed  Filed Vitals:   11/13/13 1445  BP: 128/72  Pulse: 80  Temp:   Resp: 17    Complications: No apparent anesthesia complications

## 2013-11-13 NOTE — Interval H&P Note (Signed)
History and Physical Interval Note:  11/13/2013 12:28 PM  Kim Ponce  has presented today for surgery, with the diagnosis of LEFT SHOULDER ROTATOR CUDD TEAR, LEFT CARPAL TUNNEL SYNDROME  The various methods of treatment have been discussed with the patient and family. After consideration of risks, benefits and other options for treatment, the patient has consented to  Procedure(s): LEFT SHOULDER ARTHROSCOPY WITH ROTATOR CUFF REPAIR (Left) LEFT CARPAL TUNNEL RELEASE (Left) as a surgical intervention .  The patient's history has been reviewed, patient examined, no change in status, stable for surgery.  I have reviewed the patient's chart and labs.  Questions were answered to the patient's satisfaction.     Nestor Lewandowsky

## 2013-11-13 NOTE — Op Note (Addendum)
Preoperative diagnosis: Left shoulder impingement syndrome, a.c. joint arthritis, rotator cuff tear, chondromalacia. Left carpal tunnel syndrome  Postoperative diagnosis: Left shoulder impingement syndrome with type II subacromial spur, a.c. 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 Left carpal tunnel syndrome   Procedure: Left shoulder arthroscopic anterior-inferior acromioplasty, formal distal clavicle excision, debridement of external leaflet rotator cuff tear debridement chondromalacia grade 3 focal grade 4 of the glenohumeral joint appear. Left carpal tunnel release  Surgeon: Feliberto Gottron. Turner Daniels M.D.   First assistant: Allena Katz PA-C, was present for entire procedure, was needed for retraction, operation of arthroscopic equipment, placement of dressing.  Anesthetic: Left shoulder interscalene block plus general endotracheal   Estimated blood loss: Minimal   Fluid replacement: 1200 cc of crystalloid.   Indications for procedure: Patient with shoulder impingement syndrome and a.c. joint arthritis, possible rotator cuff tear, left carpal tunnel syndrome with early thenar atrophy. who is failed conservative measures with anti-inflammatory medicines, therapy and exercises, but did get temporary relief from a cortisone injection in the subacromial space. MRI scan confirms type II subacromial spur at least partial thickness rotator cuff tear and arthritic changes to the glenohumeral joint. Because of increasing pain and weakness patient desires elective arthroscopic evaluation with acromioplasty, distal clavicle excision and we will also address any other intra-articular pathology. After the left shoulder scope we will perform left carpal tunnel release because of the early thenar muscle involvement with numbness and 10 Risks and benefits of surgery were discussed prior to the procedure and all questions  answered.   Description of procedure: Patient was identified by arm band and given preoperative IV antibiotics in the holding area, as well as interscalene block anesthetic. Patient was taken to the operating room where the appropriate anesthetic monitors were attached and general endotracheal anesthesia was induced with the patient in the supine position. Patient was then placed in the beachchair position and the L upper extremity prepped and draped in the usual sterile fashion from the wrist to the hemithorax. A time out procedure was performed. We began the operation by making standard portals 1.5 cm anterior to the acromion, 1.5 cm lateral to the junction of the middle and posterior thirds of the acromion, and 1.5 cm posterior the posterior lateral corner of the acromion process. The inflow with gravity was placed anteriorly, the arthroscope laterally, and a 4.2 great-white sucker shaver posteriorly. The subacromial bursa was resected and we visualized the subacromial spur which was then removed with a 4.5 hooded vortex bur making 2 passes. We then visualized the arthritic a.c. joint and the inferior distal centimeter of the clavicle was resected using a 4.5 hooded vortex bur from the posterior portal. We then brought the burr anteriorly, the scope posteriorly, and the inflow laterally completing the 1 cm distal clavicle excision. Carefully evaluated the rotator cuff which did have partial-thickness tearing external leaflet of the supraspinatus this is read back to a stable margin involved maybe 30% of the thickness of the external leaflet of the rotator cuff. Overall the patient's rotator cuff tissue was relatively frail and friable. Moving into the glenohumeral joint the articular and the labral cartilages were visualized and the patient had extensive degenerative tears of the labrum debrided back to a stable margin with a 3.5 Gator sucker shaver as well as grade 3 and grade 4 chondromalacia of the humeral  head likewise debridement. The internal leaflets of the supraspinatus and infraspinatus were intact  the biceps tendon and anchor were intact and the subscapularis insertion was also intact. Moving into the inferior pouch some clotted blood was removed as well as the list changes in the synovium. Again, overall her tissue appeared to be somewhat thin and atrophic pretty much globally regarding the synovium labrum and articular cartilages. At this point the shoulder was irrigated out normal saline solution the arthroscopic instruments removed and a dressing of Xerofoam 4 x 4 dressing sponges, paper tape, and sling applied.  A sterile tourniquet was placed on the L forearm. The hand and forearm were wrapped with an Esmarch bandage and the tourniquet inflated to Hg. A longitudinal palmar incision starting at the wrist flexion crease going distally for 4 cm just to the ulnar-sided the thenar crease was made through the skin and subcutaneous tissue. With traction on the skin edges, we then cut down into the carpal tunnel distally, passed a Freer elevator into the carpal tunnel palmarly and cut down on the elevator performing the carpal tunnel release. The fascial incision was extended into the forearm with the black handled scissors for 2-3cm.We then examined the tendons and the median nerve which had an hourglass appearance but was intact. There were no ganglia or masses in the carpal tunnel. The wound was irrigated out normal saline solution and the skin only closed with running 4-0 nylon suture. A dressing of Xerofoam, 4 x 4 dressing sponges, 2 inch web roll and 2 inch Ace wrap was applied and the Esmarch tourniquet removed for a tourniquet time of 10 minutes. At this point, a sling was applied, the patient was laid supine awakened extubated and taken to the recovery room.

## 2013-11-13 NOTE — Progress Notes (Signed)
Assisted Dr. Fitzgerald with left, ultrasound guided, interscalene  block. Side rails up, monitors on throughout procedure. See vital signs in flow sheet. Tolerated Procedure well. 

## 2013-11-13 NOTE — Anesthesia Preprocedure Evaluation (Signed)
Anesthesia Evaluation  Patient identified by MRN, date of birth, ID band Patient awake    Reviewed: Allergy & Precautions, H&P , NPO status , Patient's Chart, lab work & pertinent test results  Airway Mallampati: II TM Distance: >3 FB Neck ROM: Full    Dental no notable dental hx. (+) Teeth Intact, Dental Advisory Given   Pulmonary neg pulmonary ROS,  breath sounds clear to auscultation  Pulmonary exam normal       Cardiovascular negative cardio ROS  Rhythm:Regular Rate:Normal     Neuro/Psych negative neurological ROS  negative psych ROS   GI/Hepatic negative GI ROS, Neg liver ROS,   Endo/Other  negative endocrine ROS  Renal/GU negative Renal ROS  negative genitourinary   Musculoskeletal   Abdominal   Peds  Hematology negative hematology ROS (+)   Anesthesia Other Findings   Reproductive/Obstetrics negative OB ROS                           Anesthesia Physical Anesthesia Plan  ASA: II  Anesthesia Plan: General and Regional   Post-op Pain Management:    Induction: Intravenous  Airway Management Planned: Oral ETT  Additional Equipment:   Intra-op Plan:   Post-operative Plan: Extubation in OR  Informed Consent: I have reviewed the patients History and Physical, chart, labs and discussed the procedure including the risks, benefits and alternatives for the proposed anesthesia with the patient or authorized representative who has indicated his/her understanding and acceptance.   Dental advisory given  Plan Discussed with: CRNA  Anesthesia Plan Comments:         Anesthesia Quick Evaluation

## 2013-11-13 NOTE — Transfer of Care (Signed)
Immediate Anesthesia Transfer of Care Note  Patient: Ladrea Holladay  Procedure(s) Performed: Procedure(s): LEFT SHOULDER ARTHROSCOPY WITH DISTAL CLAVICLE ACROMIOPLASTY, LABRAL AND PARTIAL ROTATOR CUFF TEAR DEBRIDEMENT, SUBACROMIAL DECOMPRESSION (Left) LEFT CARPAL TUNNEL RELEASE (Left)  Patient Location: PACU  Anesthesia Type:General and GA combined with regional for post-op pain  Level of Consciousness: awake and alert   Airway & Oxygen Therapy: Patient Spontanous Breathing and Patient connected to face mask oxygen  Post-op Assessment: Report given to PACU RN and Post -op Vital signs reviewed and stable  Post vital signs: Reviewed and stable  Complications: No apparent anesthesia complications

## 2013-11-13 NOTE — Anesthesia Procedure Notes (Addendum)
Anesthesia Regional Block:  Interscalene brachial plexus block  Pre-Anesthetic Checklist: ,, timeout performed, Correct Patient, Correct Site, Correct Laterality, Correct Procedure, Correct Position, site marked, Risks and benefits discussed, pre-op evaluation,  At surgeon's request and post-op pain management  Laterality: Left  Prep: Maximum Sterile Barrier Precautions used and chloraprep       Needles:  Injection technique: Single-shot  Needle Type: Echogenic Stimulator Needle     Needle Length: 5cm 5 cm Needle Gauge: 22 and 22 G    Additional Needles:  Procedures: ultrasound guided (picture in chart) and nerve stimulator Interscalene brachial plexus block  Nerve Stimulator or Paresthesia:  Response: Biceps response,   Additional Responses:   Narrative:  Start time: 11/13/2013 12:25 PM End time: 11/13/2013 12:31 PM Injection made incrementally with aspirations every 5 mL. Anesthesiologist: Sampson Goon, MD  Additional Notes: 2% Lidocaine skin wheel.    Procedure Name: Intubation Date/Time: 11/13/2013 1:02 PM Performed by: Gar Gibbon Pre-anesthesia Checklist: Patient identified, Emergency Drugs available, Suction available, Patient being monitored and Timeout performed Patient Re-evaluated:Patient Re-evaluated prior to inductionOxygen Delivery Method: Circle System Utilized Preoxygenation: Pre-oxygenation with 100% oxygen Intubation Type: IV induction Ventilation: Mask ventilation without difficulty Laryngoscope Size: Miller and 3 Grade View: Grade II Tube type: Oral Tube size: 7.0 mm Number of attempts: 1 Airway Equipment and Method: stylet and oral airway Placement Confirmation: ETT inserted through vocal cords under direct vision,  positive ETCO2 and breath sounds checked- equal and bilateral Tube secured with: Tape Dental Injury: Teeth and Oropharynx as per pre-operative assessment

## 2013-11-13 NOTE — OR Nursing (Signed)
Pre-procedural time out charted at 1440.  Pre-procedural time out performed at 1240.

## 2013-11-14 ENCOUNTER — Encounter (HOSPITAL_BASED_OUTPATIENT_CLINIC_OR_DEPARTMENT_OTHER): Payer: Self-pay | Admitting: Orthopedic Surgery

## 2018-03-31 ENCOUNTER — Other Ambulatory Visit: Payer: Self-pay

## 2018-03-31 ENCOUNTER — Emergency Department (HOSPITAL_COMMUNITY): Payer: 59

## 2018-03-31 ENCOUNTER — Encounter (HOSPITAL_COMMUNITY): Payer: Self-pay

## 2018-03-31 ENCOUNTER — Emergency Department (HOSPITAL_COMMUNITY)
Admission: EM | Admit: 2018-03-31 | Discharge: 2018-03-31 | Disposition: A | Discharge status: Home / Self Care | Payer: 59 | Attending: Emergency Medicine | Admitting: Emergency Medicine

## 2018-03-31 DIAGNOSIS — S52502A Unspecified fracture of the lower end of left radius, initial encounter for closed fracture: Secondary | ICD-10-CM

## 2018-03-31 DIAGNOSIS — Y939 Activity, unspecified: Secondary | ICD-10-CM | POA: Insufficient documentation

## 2018-03-31 DIAGNOSIS — S52112A Torus fracture of upper end of left radius, initial encounter for closed fracture: Secondary | ICD-10-CM | POA: Insufficient documentation

## 2018-03-31 DIAGNOSIS — Z79899 Other long term (current) drug therapy: Secondary | ICD-10-CM | POA: Insufficient documentation

## 2018-03-31 DIAGNOSIS — Y929 Unspecified place or not applicable: Secondary | ICD-10-CM | POA: Diagnosis not present

## 2018-03-31 DIAGNOSIS — W1830XA Fall on same level, unspecified, initial encounter: Secondary | ICD-10-CM | POA: Diagnosis not present

## 2018-03-31 DIAGNOSIS — Y999 Unspecified external cause status: Secondary | ICD-10-CM | POA: Insufficient documentation

## 2018-03-31 DIAGNOSIS — S6992XA Unspecified injury of left wrist, hand and finger(s), initial encounter: Secondary | ICD-10-CM | POA: Diagnosis present

## 2018-03-31 MED ORDER — LIDOCAINE HCL 2 % IJ SOLN
10.0000 mL | Freq: Once | INTRAMUSCULAR | Status: AC
  Administered 2018-03-31: 200 mg
  Filled 2018-03-31: qty 20

## 2018-03-31 MED ORDER — HYDROCODONE-ACETAMINOPHEN 5-325 MG PO TABS
1.0000 | ORAL_TABLET | Freq: Once | ORAL | Status: AC
  Administered 2018-03-31: 1 via ORAL
  Filled 2018-03-31: qty 1

## 2018-03-31 MED ORDER — HYDROCODONE-ACETAMINOPHEN 5-325 MG PO TABS: 1.0000 | ORAL_TABLET | ORAL | 0 refills | Status: DC | PRN

## 2018-03-31 MED ORDER — PROPOFOL 10 MG/ML IV BOLUS
0.5000 mg/kg | Freq: Once | INTRAVENOUS | Status: DC
  Filled 2018-03-31: qty 20

## 2018-03-31 MED ORDER — FENTANYL CITRATE (PF) 100 MCG/2ML IJ SOLN
50.0000 ug | Freq: Once | INTRAMUSCULAR | Status: AC
Start: 2018-03-31 — End: 2018-03-31
  Administered 2018-03-31: 50 ug via INTRAVENOUS
  Filled 2018-03-31: qty 2

## 2018-03-31 MED ORDER — BUPIVACAINE HCL (PF) 0.25 % IJ SOLN
10.0000 mL | Freq: Once | INTRAMUSCULAR | Status: AC
  Administered 2018-03-31: 10 mL
  Filled 2018-03-31: qty 30

## 2018-03-31 MED ORDER — BUPIVACAINE HCL 0.25 % IJ SOLN
10.0000 mL | Freq: Once | INTRAMUSCULAR | Status: DC
  Filled 2018-03-31: qty 10

## 2018-03-31 MED ORDER — PROPOFOL 10 MG/ML IV BOLUS
INTRAVENOUS | Status: AC | PRN
  Administered 2018-03-31: 50 mg via INTRAVENOUS

## 2018-03-31 NOTE — Discharge Instructions (Addendum)
Schedule appointment for tomorrow.

## 2018-03-31 NOTE — ED Provider Notes (Signed)
[facility]-EMERGENCY DEPT Provider Note   CSN: [id number] Arrival date & time: 03/31/18  1014     History   Chief Complaint Chief Complaint  Patient presents with  . Fall  . Wrist Pain    HPI Kim Ponce is a 67 y.o. female.  HPI Patient right-hand-dominant.  Fell onto outstretched left hand from standing earlier today.  Immediate pain to the left wrist.  Denies head or neck injury.  No elbow or shoulder pain.  Denies numbness to the hand.  Last ate earlier this morning.  States she had a rice crispy treat and soda.  No previous complications with anesthesia. Past Medical History:  Diagnosis Date  . Arthritis   . Wears glasses     Patient Active Problem List   Diagnosis Date Noted  . Osteoarthritis of left knee 09/28/2012    Past Surgical History:  Procedure Laterality Date  . ABDOMINAL HYSTERECTOMY    . BACK SURGERY     lumbar disectomy  . CARPAL TUNNEL RELEASE Left 11/13/2013   Procedure: LEFT CARPAL TUNNEL RELEASE;  Surgeon: Nestor Lewandowsky, MD;  Location: [facility];  Service: Orthopedics;  Laterality: Left;  . SHOULDER ARTHROSCOPY WITH ROTATOR CUFF REPAIR Left 11/13/2013   Procedure: LEFT SHOULDER ARTHROSCOPY WITH DISTAL CLAVICLE ACROMIOPLASTY, LABRAL AND PARTIAL ROTATOR CUFF TEAR DEBRIDEMENT, SUBACROMIAL DECOMPRESSION;  Surgeon: Nestor Lewandowsky, MD;  Location: [facility];  Service: Orthopedics;  Laterality: Left;  . TOTAL KNEE ARTHROPLASTY  09/26/2012   LEFT KNEE           Dr Turner Daniels   . TOTAL KNEE ARTHROPLASTY  09/26/2012   Procedure: TOTAL KNEE ARTHROPLASTY;  Surgeon: Nestor Lewandowsky, MD;  Location: [facility] OR;  Service: Orthopedics;  Laterality: Left;     OB History   None      Home Medications    Prior to Admission medications   Medication Sig Start Date End Date Taking? Authorizing Provider  HYDROcodone-acetaminophen (NORCO) 5-325 MG tablet Take 1-2 tablets by mouth every 4 (four) hours as needed for severe pain.  03/31/18   Loren Racer, MD  methocarbamol (ROBAXIN) 500 MG tablet Take 1 tablet (500 mg total) by mouth every 6 (six) hours as needed. 09/28/12   Shirl Harris, PA-C  nabumetone (RELAFEN) 500 MG tablet Take 500 mg by mouth 2 (two) times daily.    [provider]  oxyCODONE-acetaminophen (ROXICET) 5-325 MG per tablet Take 1-2 tablets by mouth every 4 (four) hours as needed for pain. 09/28/12   Shirl Harris, PA-C  oxyCODONE-acetaminophen (ROXICET) 5-325 MG per tablet Take 1 tablet by mouth every 4 (four) hours as needed. 11/13/13   Allena Katz, PA-C    Family History No family history on file.  Social History Social History   Tobacco Use  . Smoking status: Never Smoker  . Smokeless tobacco: Never Used  Substance Use Topics  . Alcohol use: Yes    Alcohol/week: 4.0 standard drinks    Types: 4 Glasses of wine per week    Comment: occassional  . Drug use: No     Allergies   Morphine and related; Penicillins; Sulfa antibiotics; and Codeine   Review of Systems Review of Systems  Gastrointestinal: Negative for diarrhea and vomiting.  Musculoskeletal: Positive for arthralgias and joint swelling. Negative for myalgias and neck pain.  Skin: Negative for rash and wound.  Neurological: Negative for dizziness, syncope, weakness, light-headedness, numbness and headaches.  All other systems reviewed and are negative.  Physical Exam Updated Vital Signs BP (!) 159/112   Pulse 60   Temp 97.7 F (36.5 C) (Oral)   Resp 15   Ht 5\' 6"  (1.676 m)   Wt 67.6 kg   SpO2 100%   BMI 24.05 kg/m   Physical Exam  Constitutional: She is oriented to person, place, and time. She appears well-developed and well-nourished.  HENT:  Head: Normocephalic and atraumatic.  Mouth/Throat: Oropharynx is clear and moist.  No obvious scalp injury.  Eyes: Pupils are equal, round, and reactive to light. EOM are normal.  Neck: Normal range of motion. Neck supple.  No posterior midline  cervical tenderness to palpation.  Cardiovascular: Normal rate and regular rhythm. Exam reveals no gallop and no friction rub.  No murmur heard. Pulmonary/Chest: Effort normal and breath sounds normal. No stridor. No respiratory distress. She has no wheezes. She has no rales. She exhibits no tenderness.  Abdominal: Soft. Bowel sounds are normal. There is no tenderness. There is no rebound and no guarding.  Musculoskeletal: Normal range of motion. She exhibits no edema or tenderness.  Distal left radius deformity.  Distal fragment is dorsally displaced.  Full range of motion of the left elbow and left shoulder without pain, swelling or deformity.  Good distal cap refill.  Neurological: She is alert and oriented to person, place, and time.  Moving all fingers without deficit.  Sensation to light touch is intact.  Skin: Skin is warm and dry. Capillary refill takes less than 2 seconds. No rash noted. No erythema.  Psychiatric: She has a normal mood and affect. Her behavior is normal.  Nursing note and vitals reviewed.    ED Treatments / Results  Labs (all labs ordered are listed, but only abnormal results are displayed) Labs Reviewed - No data to display  EKG None  Radiology Dg Wrist Complete Left  Result Date: 03/31/2018 CLINICAL DATA:  Post left wrist reduction EXAM: LEFT WRIST - COMPLETE 3+ VIEW COMPARISON:  03/31/2018 FINDINGS: Wrist is imaged through splinting material. There has been interval reduction of comminuted distal radial fracture. Alignment is near anatomic. No significant angulation. No interval fractures. There are degenerative changes in the first carpometacarpal joint. IMPRESSION: Closed reduction of the distal LEFT radius. Alignment near anatomic. Electronically Signed   By: 05/31/2018 M.D.   On: 03/31/2018 13:45   Dg Wrist Complete Left  Result Date: 03/31/2018 CLINICAL DATA:  Recent fall with wrist pain, initial encounter EXAM: LEFT WRIST - COMPLETE 3+ VIEW  COMPARISON:  None. FINDINGS: Comminuted fracture of the distal radius is noted with impaction and posterior displacement of the distal fracture fragments. No ulnar fracture is seen. Degenerative changes of the carpal bones are noted. IMPRESSION: Distal radial fracture with posterior angulation and impaction. Electronically Signed   By: 05/31/2018 M.D.   On: 03/31/2018 11:06    Procedures Reduction of fracture Date/Time: 03/31/2018 12:25 PM Performed by: 05/31/2018, MD Authorized by: Loren Racer, MD  Consent: Written consent obtained. Consent given by: patient Patient understanding: patient states understanding of the procedure being performed Patient consent: the patient's understanding of the procedure matches consent given Imaging studies: imaging studies available Patient identity confirmed: verbally with patient and arm band Time out: Immediately prior to procedure a "time out" was called to verify the correct patient, procedure, equipment, support staff and site/side marked as required. Local anesthesia used: yes Anesthesia: hematoma block  Anesthesia: Local anesthesia used: yes Local Anesthetic: lidocaine 2% without epinephrine and bupivacaine 0.25% without epinephrine  Anesthetic total: 8 mL  Sedation: Patient sedated: yes Sedation type: moderate (conscious) sedation Sedatives: propofol Sedation start date/time: 03/31/2018 12:25 PM Sedation end date/time: 03/31/2018 12:35 PM Vitals: Vital signs were monitored during sedation.  Patient tolerance: Patient tolerated the procedure well with no immediate complications Comments: Left distal radius fracture reduction and sugar tong splint placement.  Improved anatomical alignment.  Good distal cap refill.    (including critical care time)  Medications Ordered in ED Medications  lidocaine (XYLOCAINE) 2 % (with pres) injection 200 mg (200 mg Infiltration Given 03/31/18 1200)  fentaNYL (SUBLIMAZE) injection 50 mcg (50 mcg  Intravenous Given 03/31/18 1200)  bupivacaine (PF) (MARCAINE) 0.25 % injection 10 mL (10 mLs Infiltration Given 03/31/18 1200)  propofol (DIPRIVAN) 10 mg/mL bolus/IV push (50 mg Intravenous Given 03/31/18 1252)  HYDROcodone-acetaminophen (NORCO/VICODIN) 5-325 MG per tablet 1 tablet (1 tablet Oral Given 03/31/18 1428)     Initial Impression / Assessment and Plan / ED Course  I have reviewed the triage vital signs and the nursing notes.  Pertinent labs & imaging results that were available during my care of the patient were reviewed by me and considered in my medical decision making (see chart for details).     Discussed with Dr. Merlyn Lot who reviewed patient's x-ray.  Will attempt to reduce distal radius fracture in the emergency department. Improved anatomical alignment.  Reviewed by Dr. Jessy Oto.  Advised to follow-up in his office tomorrow.  Return precautions given. Final Clinical Impressions(s) / ED Diagnoses   Final diagnoses:  Closed fracture of distal end of left radius, unspecified fracture morphology, initial encounter    ED Discharge Orders         Ordered    HYDROcodone-acetaminophen (NORCO) 5-325 MG tablet  Every 4 hours PRN     03/31/18 1451           Loren Racer, MD 03/31/18 1455

## 2018-03-31 NOTE — ED Triage Notes (Signed)
Pt works with disabled individuals. One man went to go give her a hug and she was not ready for it, causing them to fall to the ground. Pt attempted to catch self, injuring left wrist. Did not hit head.

## 2018-03-31 NOTE — ED Notes (Signed)
X-ray at bedside

## 2018-04-05 ENCOUNTER — Other Ambulatory Visit: Payer: Self-pay | Admitting: Orthopedic Surgery

## 2018-04-06 ENCOUNTER — Other Ambulatory Visit: Payer: Self-pay

## 2018-04-06 ENCOUNTER — Encounter (HOSPITAL_BASED_OUTPATIENT_CLINIC_OR_DEPARTMENT_OTHER): Payer: Self-pay | Admitting: *Deleted

## 2018-04-07 ENCOUNTER — Ambulatory Visit (HOSPITAL_BASED_OUTPATIENT_CLINIC_OR_DEPARTMENT_OTHER): Payer: PRIVATE HEALTH INSURANCE | Admitting: Certified Registered"

## 2018-04-07 ENCOUNTER — Encounter (HOSPITAL_BASED_OUTPATIENT_CLINIC_OR_DEPARTMENT_OTHER)
Admission: RE | Disposition: A | Discharge status: Home / Self Care | Payer: Self-pay | Source: Ambulatory Visit | Attending: Orthopedic Surgery

## 2018-04-07 ENCOUNTER — Ambulatory Visit (HOSPITAL_BASED_OUTPATIENT_CLINIC_OR_DEPARTMENT_OTHER)
Admission: RE | Admit: 2018-04-07 | Discharge: 2018-04-07 | Disposition: A | Discharge status: Home / Self Care | Payer: PRIVATE HEALTH INSURANCE | Source: Ambulatory Visit | Attending: Orthopedic Surgery | Admitting: Orthopedic Surgery

## 2018-04-07 ENCOUNTER — Encounter (HOSPITAL_BASED_OUTPATIENT_CLINIC_OR_DEPARTMENT_OTHER): Payer: Self-pay | Admitting: *Deleted

## 2018-04-07 ENCOUNTER — Other Ambulatory Visit: Payer: Self-pay

## 2018-04-07 DIAGNOSIS — Z791 Long term (current) use of non-steroidal anti-inflammatories (NSAID): Secondary | ICD-10-CM | POA: Diagnosis not present

## 2018-04-07 DIAGNOSIS — Z96652 Presence of left artificial knee joint: Secondary | ICD-10-CM | POA: Diagnosis not present

## 2018-04-07 DIAGNOSIS — S52502A Unspecified fracture of the lower end of left radius, initial encounter for closed fracture: Secondary | ICD-10-CM | POA: Diagnosis present

## 2018-04-07 DIAGNOSIS — S52552A Other extraarticular fracture of lower end of left radius, initial encounter for closed fracture: Secondary | ICD-10-CM | POA: Diagnosis not present

## 2018-04-07 DIAGNOSIS — Y990 Civilian activity done for income or pay: Secondary | ICD-10-CM | POA: Diagnosis not present

## 2018-04-07 DIAGNOSIS — W19XXXA Unspecified fall, initial encounter: Secondary | ICD-10-CM | POA: Insufficient documentation

## 2018-04-07 HISTORY — DX: Unspecified fracture of the lower end of left radius, initial encounter for closed fracture: S52.502A

## 2018-04-07 HISTORY — PX: OPEN REDUCTION INTERNAL FIXATION (ORIF) DISTAL RADIAL FRACTURE: SHX5989

## 2018-04-07 SURGERY — OPEN REDUCTION INTERNAL FIXATION (ORIF) DISTAL RADIUS FRACTURE
Anesthesia: General | Site: Wrist | Laterality: Left

## 2018-04-07 MED ORDER — METOCLOPRAMIDE HCL 5 MG/ML IJ SOLN: 10.0000 mg | Freq: Once | INTRAMUSCULAR | Status: DC | PRN

## 2018-04-07 MED ORDER — FENTANYL CITRATE (PF) 100 MCG/2ML IJ SOLN
INTRAMUSCULAR | Status: AC
  Filled 2018-04-07: qty 2

## 2018-04-07 MED ORDER — MIDAZOLAM HCL 2 MG/2ML IJ SOLN
1.0000 mg | INTRAMUSCULAR | Status: DC | PRN
  Administered 2018-04-07: 2 mg via INTRAVENOUS

## 2018-04-07 MED ORDER — FENTANYL CITRATE (PF) 100 MCG/2ML IJ SOLN: 25.0000 ug | INTRAMUSCULAR | Status: DC | PRN

## 2018-04-07 MED ORDER — LACTATED RINGERS IV SOLN
INTRAVENOUS | Status: DC
  Administered 2018-04-07: 10:00:00 via INTRAVENOUS

## 2018-04-07 MED ORDER — MIDAZOLAM HCL 2 MG/2ML IJ SOLN
INTRAMUSCULAR | Status: AC
  Filled 2018-04-07: qty 2

## 2018-04-07 MED ORDER — HYDROCODONE-ACETAMINOPHEN 5-325 MG PO TABS: ORAL_TABLET | ORAL | 0 refills | Status: DC

## 2018-04-07 MED ORDER — GLYCOPYRROLATE 0.2 MG/ML IJ SOLN
INTRAMUSCULAR | Status: DC | PRN
  Administered 2018-04-07 (×2): 0.1 mg via INTRAVENOUS

## 2018-04-07 MED ORDER — MEPERIDINE HCL 25 MG/ML IJ SOLN: 6.2500 mg | INTRAMUSCULAR | Status: DC | PRN

## 2018-04-07 MED ORDER — LIDOCAINE 2% (20 MG/ML) 5 ML SYRINGE
INTRAMUSCULAR | Status: AC
  Filled 2018-04-07: qty 5

## 2018-04-07 MED ORDER — VANCOMYCIN HCL IN DEXTROSE 1-5 GM/200ML-% IV SOLN
1000.0000 mg | INTRAVENOUS | Status: AC
  Administered 2018-04-07: 1000 mg via INTRAVENOUS

## 2018-04-07 MED ORDER — LIDOCAINE 2% (20 MG/ML) 5 ML SYRINGE
INTRAMUSCULAR | Status: DC | PRN
  Administered 2018-04-07: 40 mg via INTRAVENOUS

## 2018-04-07 MED ORDER — ROPIVACAINE HCL 7.5 MG/ML IJ SOLN
INTRAMUSCULAR | Status: DC | PRN
  Administered 2018-04-07: 20 mL via PERINEURAL

## 2018-04-07 MED ORDER — SCOPOLAMINE 1 MG/3DAYS TD PT72: 1.0000 | MEDICATED_PATCH | Freq: Once | TRANSDERMAL | Status: DC | PRN

## 2018-04-07 MED ORDER — ONDANSETRON HCL 4 MG/2ML IJ SOLN
INTRAMUSCULAR | Status: DC | PRN
  Administered 2018-04-07: 4 mg via INTRAVENOUS

## 2018-04-07 MED ORDER — FENTANYL CITRATE (PF) 100 MCG/2ML IJ SOLN
50.0000 ug | INTRAMUSCULAR | Status: DC | PRN
  Administered 2018-04-07: 100 ug via INTRAVENOUS

## 2018-04-07 MED ORDER — PROPOFOL 10 MG/ML IV BOLUS
INTRAVENOUS | Status: DC | PRN
  Administered 2018-04-07: 120 mg via INTRAVENOUS

## 2018-04-07 MED ORDER — LACTATED RINGERS IV SOLN: INTRAVENOUS | Status: DC

## 2018-04-07 MED ORDER — DEXAMETHASONE SODIUM PHOSPHATE 10 MG/ML IJ SOLN
INTRAMUSCULAR | Status: DC | PRN
  Administered 2018-04-07: 10 mg via INTRAVENOUS

## 2018-04-07 MED ORDER — CHLORHEXIDINE GLUCONATE 4 % EX LIQD: 60.0000 mL | Freq: Once | CUTANEOUS | Status: DC

## 2018-04-07 MED ORDER — EPHEDRINE SULFATE-NACL 50-0.9 MG/10ML-% IV SOSY
PREFILLED_SYRINGE | INTRAVENOUS | Status: DC | PRN
  Administered 2018-04-07: 10 mg via INTRAVENOUS

## 2018-04-07 MED ORDER — VANCOMYCIN HCL IN DEXTROSE 1-5 GM/200ML-% IV SOLN
INTRAVENOUS | Status: AC
  Filled 2018-04-07: qty 200

## 2018-04-07 SURGICAL SUPPLY — 61 items
BANDAGE ACE 3X5.8 VEL STRL LF (GAUZE/BANDAGES/DRESSINGS) ×3 IMPLANT
BIT DRILL 2.0 LNG QUCK RELEASE (BIT) ×1 IMPLANT
BIT DRILL 2.8X5 QR DISP (BIT) ×3 IMPLANT
BLADE SURG 15 STRL LF DISP TIS (BLADE) ×2 IMPLANT
BLADE SURG 15 STRL SS (BLADE) ×4
BNDG ESMARK 4X9 LF (GAUZE/BANDAGES/DRESSINGS) ×3 IMPLANT
BNDG GAUZE ELAST 4 BULKY (GAUZE/BANDAGES/DRESSINGS) ×3 IMPLANT
BNDG PLASTER X FAST 3X3 WHT LF (CAST SUPPLIES) ×30 IMPLANT
CHLORAPREP W/TINT 26ML (MISCELLANEOUS) ×3 IMPLANT
CORD BIPOLAR FORCEPS 12FT (ELECTRODE) ×3 IMPLANT
COVER BACK TABLE 60X90IN (DRAPES) ×3 IMPLANT
COVER MAYO STAND STRL (DRAPES) ×3 IMPLANT
CUFF TOURNIQUET SINGLE 18IN (TOURNIQUET CUFF) ×3 IMPLANT
CUFF TOURNIQUET SINGLE 24IN (TOURNIQUET CUFF) IMPLANT
DRAPE EXTREMITY T 121X128X90 (DRAPE) ×3 IMPLANT
DRAPE OEC MINIVIEW 54X84 (DRAPES) ×3 IMPLANT
DRAPE SURG 17X23 STRL (DRAPES) ×3 IMPLANT
DRILL 2.0 LNG QUICK RELEASE (BIT) ×3
GAUZE SPONGE 4X4 12PLY STRL (GAUZE/BANDAGES/DRESSINGS) ×3 IMPLANT
GAUZE XEROFORM 1X8 LF (GAUZE/BANDAGES/DRESSINGS) ×3 IMPLANT
GLOVE BIO SURGEON STRL SZ 6.5 (GLOVE) ×2 IMPLANT
GLOVE BIO SURGEON STRL SZ7.5 (GLOVE) ×3 IMPLANT
GLOVE BIO SURGEONS STRL SZ 6.5 (GLOVE) ×1
GLOVE BIOGEL PI IND STRL 7.0 (GLOVE) ×1 IMPLANT
GLOVE BIOGEL PI IND STRL 8 (GLOVE) ×1 IMPLANT
GLOVE BIOGEL PI IND STRL 8.5 (GLOVE) IMPLANT
GLOVE BIOGEL PI INDICATOR 7.0 (GLOVE) ×2
GLOVE BIOGEL PI INDICATOR 8 (GLOVE) ×2
GLOVE BIOGEL PI INDICATOR 8.5 (GLOVE)
GLOVE SURG ORTHO 8.0 STRL STRW (GLOVE) IMPLANT
GOWN STRL REUS W/ TWL LRG LVL3 (GOWN DISPOSABLE) IMPLANT
GOWN STRL REUS W/TWL LRG LVL3 (GOWN DISPOSABLE)
GOWN STRL REUS W/TWL XL LVL3 (GOWN DISPOSABLE) ×6 IMPLANT
GUIDEWIRE ORTHO 0.054X6 (WIRE) ×9 IMPLANT
NEEDLE HYPO 25X1 1.5 SAFETY (NEEDLE) IMPLANT
NS IRRIG 1000ML POUR BTL (IV SOLUTION) ×3 IMPLANT
PACK BASIN DAY SURGERY FS (CUSTOM PROCEDURE TRAY) ×3 IMPLANT
PAD CAST 3X4 CTTN HI CHSV (CAST SUPPLIES) ×1 IMPLANT
PADDING CAST COTTON 3X4 STRL (CAST SUPPLIES) ×2
PLATE PROX NARROW LEFT (Plate) ×3 IMPLANT
SCREW ACTK 2 NL HEX 3.5.11 (Screw) ×3 IMPLANT
SCREW CORT FT 18X2.3XLCK HEX (Screw) ×1 IMPLANT
SCREW CORT FT 20X2.3XLCK HEX (Screw) ×1 IMPLANT
SCREW CORTICAL LOCKING 2.3X16M (Screw) ×2 IMPLANT
SCREW CORTICAL LOCKING 2.3X18M (Screw) ×8 IMPLANT
SCREW CORTICAL LOCKING 2.3X20M (Screw) ×2 IMPLANT
SCREW FX16X2.3XLCK SMTH NS CRT (Screw) ×1 IMPLANT
SCREW FX18X2.3XSMTH LCK NS CRT (Screw) ×3 IMPLANT
SCREW NLCKG 13 3.5X13 HEXA (Screw) ×1 IMPLANT
SCREW NON-LOCK 3.5X13 (Screw) ×3 IMPLANT
SCREW NONLOCK HEX 3.5X12 (Screw) ×6 IMPLANT
SLEEVE SCD COMPRESS KNEE MED (MISCELLANEOUS) IMPLANT
SLING ARM FOAM STRAP MED (SOFTGOODS) ×3 IMPLANT
STOCKINETTE 4X48 STRL (DRAPES) ×3 IMPLANT
SUT ETHILON 4 0 PS 2 18 (SUTURE) ×3 IMPLANT
SUT VICRYL 4-0 PS2 18IN ABS (SUTURE) ×3 IMPLANT
SYR BULB 3OZ (MISCELLANEOUS) ×3 IMPLANT
SYR CONTROL 10ML LL (SYRINGE) IMPLANT
TOWEL GREEN STERILE FF (TOWEL DISPOSABLE) ×6 IMPLANT
TOWEL OR NON WOVEN STRL DISP B (DISPOSABLE) ×3 IMPLANT
UNDERPAD 30X30 (UNDERPADS AND DIAPERS) ×3 IMPLANT

## 2018-04-07 NOTE — Anesthesia Procedure Notes (Addendum)
Anesthesia Regional Block: Interscalene brachial plexus block   Pre-Anesthetic Checklist: ,, timeout performed, Correct Patient, Correct Site, Correct Laterality, Correct Procedure, Correct Position, site marked, Risks and benefits discussed,  Surgical consent,  Pre-op evaluation,  At surgeon's request and post-op pain management  Laterality: Right  Prep: chloraprep       Needles:  Injection technique: Single-shot  Needle Type: Echogenic Stimulator Needle     Needle Length: 5cm  Needle Gauge: 22     Additional Needles:   Procedures:, nerve stimulator,,, ultrasound used (permanent image in chart),,,,   Nerve Stimulator or Paresthesia:  Response: hand, 0.45 mA,   Additional Responses:   Narrative:  Start time: 04/07/2018 11:17 AM End time: 04/07/2018 11:24 AM Injection made incrementally with aspirations every 5 mL.  Performed by: Personally  Anesthesiologist: Bethena Midget, MD  Additional Notes: Functioning IV was confirmed and monitors were applied.  A 70mm 22ga Arrow echogenic stimulator needle was used. Sterile prep and drape,hand hygiene and sterile gloves were used. Ultrasound guidance: relevant anatomy identified, needle position confirmed, local anesthetic spread visualized around nerve(s)., vascular puncture avoided.  Image printed for medical record. Negative aspiration and negative test dose prior to incremental administration of local anesthetic. The patient tolerated the procedure well.

## 2018-04-07 NOTE — Anesthesia Postprocedure Evaluation (Signed)
Anesthesia Post Note  Patient: Kim Ponce  Procedure(s) Performed: OPEN REDUCTION INTERNAL FIXATION (ORIF) DISTAL RADIAL FRACTURE (Left Wrist)     Patient location during evaluation: PACU Anesthesia Type: General and Regional Level of consciousness: awake and alert Pain management: pain level controlled Vital Signs Assessment: post-procedure vital signs reviewed and stable Respiratory status: spontaneous breathing, nonlabored ventilation, respiratory function stable and patient connected to nasal cannula oxygen Cardiovascular status: blood pressure returned to baseline and stable Postop Assessment: no apparent nausea or vomiting Anesthetic complications: no    Last Vitals:  Vitals:   04/07/18 1326 04/07/18 1407  BP:  123/67  Pulse: 70 64  Resp: 12 16  Temp:  36.6 C  SpO2: 96% 98%    Last Pain:  Vitals:   04/07/18 1407  TempSrc: Oral  PainSc: 0-No pain                 Phillips Grout

## 2018-04-07 NOTE — Anesthesia Procedure Notes (Signed)
Procedure Name: LMA Insertion Date/Time: 04/07/2018 11:37 AM Performed by: Marny Lowenstein, CRNA Pre-anesthesia Checklist: Patient identified, Emergency Drugs available, Suction available and Patient being monitored Patient Re-evaluated:Patient Re-evaluated prior to induction Oxygen Delivery Method: Circle system utilized Preoxygenation: Pre-oxygenation with 100% oxygen Induction Type: IV induction Ventilation: Mask ventilation without difficulty LMA: LMA inserted LMA Size: 4.0 Number of attempts: 1 Placement Confirmation: positive ETCO2,  CO2 detector and breath sounds checked- equal and bilateral Tube secured with: Tape Dental Injury: Teeth and Oropharynx as per pre-operative assessment

## 2018-04-07 NOTE — Op Note (Signed)
04/07/2018 [facility]  Operative Note  Pre Op Diagnosis: Left comminuted extraarticular distal radius fracture  Post Op Diagnosis: Left comminuted extraarticular distal radius fracture  Procedure: ORIF Left comminuted extraarticular distal radius fracture  Surgeon: Betha Loa, MD  Assistant: none  Anesthesia: General with regional  Fluids: Per anesthesia flow sheet  EBL: minimal  Complications: None  Specimen: None  Tourniquet Time:  Total Tourniquet Time Documented: Upper Arm (Left) - 50 minutes Total: Upper Arm (Left) - 50 minutes   Disposition: Stable to PACU  INDICATIONS:  Kim Ponce is a 67 y.o. female states she fell at work ~ 1 week ago injuring her left wrist.  Seen in ED where XR revealed a distal radius fracture.  Closed reduction performed by ED.  She was splinted and followed up in the office.  We discussed nonoperative and operative treatment options.  She wished to proceed with operative fixation.  Risks, benefits, and alternatives of surgery were discussed including the risk of blood loss; infection; damage to nerves, vessels, tendons, ligaments, bone; failure of surgery; need for additional surgery; complications with wound healing; continued pain; nonunion; malunion; stiffness.  We also discussed the possible need for bone graft and the benefits and risks including the possibility of disease transmission.  She voiced understanding of these risks and elected to proceed.    OPERATIVE COURSE:  After being identified preoperatively by myself, the patient and I agreed upon the procedure and site of procedure.  Surgical site was marked.  The risks, benefits and alternatives of the surgery were reviewed and she wished to proceed.  Surgical consent had been signed.  She was given IV Ancef as preoperative antibiotic prophylaxis.  She was transferred to the operating room and placed on the operating room table in supine position with the Left upper  extremity on an armboard. General anesthesia was induced by the anesthesiologist.  A regional block had been performed by anesthesia in preoperative holding.  The Left upper extremity was prepped and draped in normal sterile orthopedic fashion.  A surgical pause was performed between the surgeons, anesthesia and operating room staff, and all were in agreement as to the patient, procedure and site of procedure.  Tourniquet at the proximal aspect of the extremity was inflated to 250 mmHg after exsanguination of the limb with an Esmarch bandage.  Standard volar Sherilyn Cooter approach was used.  The bipolar electrocautery was used to obtain hemostasis.  The superficial and deep portions of the FCR tendon sheath were incised, and the FCR and FPL were swept ulnarly to protect the palmar cutaneous branch of the median nerve.  The brachioradialis was released at the radial side of the radius.  The pronator quadratus was released and elevated with the periosteal elevator.  The fracture site was identified and cleared of soft tissue interposition and hematoma.  It was reduced under direct visualization.  There did not appear to be intraarticular extension.  There was comminution at the radial side.  An [employer]uMed volar distal radial locking plate was selected.  It was secured to the bone with the guidepins.  C-arm was used in AP and lateral projections to ensure appropriate reduction and position of the hardware and adjustments made as necessary.  Standard AO drilling and measuring technique was used.  A single screw was placed in the slotted hole in the shaft of the plate.  The distal holes were filled with locking pegs with the exception of the styloid holes, which were filled with locking  screws.  The remaining holes in the shaft of the plate were filled with nonlocking screws.  Good purchase was obtained.  C-arm was used in AP, lateral and oblique projections to ensure appropriate reduction and position of hardware, which was the  case.  There was no intra-articular penetration of hardware.  The wound was copiously irrigated with sterile saline.  Pronator quadratus was repaired back over top of the plate using 4-0 Vicryl suture.  Vicryl suture was placed in the subcutaneous tissues in an inverted interrupted fashion and the skin was closed with 4-0 nylon in a horizontal mattress fashion.  There was good pronation and supination of the wrist without crepitance.  The wound was then dressed with sterile Xeroform, 4x4s, and wrapped with a Kerlix bandage.  A volar splint was placed and wrapped with Kerlix and Ace bandage.  Tourniquet was deflated at 50 minutes.  Fingertips were pink with brisk capillary refill after deflation of the tourniquet.  Operative drapes were broken down.  The patient was awoken from anesthesia safely.  She was transferred back to the stretcher and taken to the PACU in stable condition.  I will see her back in the office in one week for postoperative followup.  I will give her a prescription for Norco 5/325 1-2 tabs PO q6 hours prn pain, dispense # 30.    Tami Ribas, MD Electronically signed, 04/07/18

## 2018-04-07 NOTE — Discharge Instructions (Addendum)
° °  ° ° ° °[facility] Center Instructions °Hand Surgery ° °Wound Care: °Keep your hand elevated above the level of your heart.  Do not allow it to dangle by your side.  Keep the dressing dry and do not remove it unless your doctor advises you to do so.  He will usually change it at the time of your post-op visit.  Moving your fingers is advised to stimulate circulation but will depend on the site of your surgery.  If you have a splint applied, your doctor will advise you regarding movement. ° °Activity: °Do not drive or operate machinery today.  Rest today and then you may return to your normal activity and work as indicated by your physician. ° °Diet:  °Drink liquids today or eat a light diet.  You may resume a regular diet tomorrow.   ° °General expectations: °Pain for two to three days. °Fingers may become slightly swollen. ° °Call your doctor if any of the following occur: °Severe pain not relieved by pain medication. °Elevated temperature. °Dressing soaked with blood. °Inability to move fingers. °White or bluish color to fingers. ° ° °Regional Anesthesia Blocks ° °1. Numbness or the inability to move the "blocked" extremity may last from 3-48 hours after placement. The length of time depends on the medication injected and your individual response to the medication. If the numbness is not going away after 48 hours, call your surgeon. ° °2. The extremity that is blocked will need to be protected until the numbness is gone and the  Strength has returned. Because you cannot feel it, you will need to take extra care to avoid injury. Because it may be weak, you may have difficulty moving it or using it. You may not know what position it is in without looking at it while the block is in effect. ° °3. For blocks in the legs and feet, returning to weight bearing and walking needs to be done carefully. You will need to wait until the numbness is entirely gone and the strength has returned. You should be able to move your leg  and foot normally before you try and bear weight or walk. You will need someone to be with you when you first try to ensure you do not fall and possibly risk injury. ° °4. Bruising and tenderness at the needle site are common side effects and will resolve in a few days. ° °5. Persistent numbness or new problems with movement should be communicated to the surgeon or the [facility] ([phone])/ [facility] ([phone]). ° ° ° °Post Anesthesia Home Care Instructions ° °Activity: °Get plenty of rest for the remainder of the day. A responsible individual must stay with you for 24 hours following the procedure.  °For the next 24 hours, DO NOT: °-Drive a car °-Operate machinery °-Drink alcoholic beverages °-Take any medication unless instructed by your physician °-Make any legal decisions or sign important papers. ° °Meals: °Start with liquid foods such as gelatin or soup. Progress to regular foods as tolerated. Avoid greasy, spicy, heavy foods. If nausea and/or vomiting occur, drink only clear liquids until the nausea and/or vomiting subsides. Call your physician if vomiting continues. ° °Special Instructions/Symptoms: °Your throat may feel dry or sore from the anesthesia or the breathing tube placed in your throat during surgery. If this causes discomfort, gargle with warm salt water. The discomfort should disappear within 24 hours. ° °If you had a scopolamine patch placed behind your ear for   the management of post- operative nausea and/or vomiting: ° °1. The medication in the patch is effective for 72 hours, after which it should be removed.  Wrap patch in a tissue and discard in the trash. Wash hands thoroughly with soap and water. °2. You may remove the patch earlier than 72 hours if you experience unpleasant side effects which may include dry mouth, dizziness or visual disturbances. °3. Avoid touching the patch. Wash your hands with soap and water after contact with the patch. °  ° ° °

## 2018-04-07 NOTE — Anesthesia Preprocedure Evaluation (Signed)
Anesthesia Evaluation  Patient identified by MRN, date of birth, ID band Patient awake    Reviewed: Allergy & Precautions, NPO status , Patient's Chart, lab work & pertinent test results  Airway Mallampati: II  TM Distance: >3 FB Neck ROM: Full    Dental no notable dental hx.    Pulmonary neg pulmonary ROS,    Pulmonary exam normal breath sounds clear to auscultation       Cardiovascular negative cardio ROS Normal cardiovascular exam Rhythm:Regular Rate:Normal     Neuro/Psych negative neurological ROS  negative psych ROS   GI/Hepatic negative GI ROS, Neg liver ROS,   Endo/Other  negative endocrine ROS  Renal/GU negative Renal ROS  negative genitourinary   Musculoskeletal negative musculoskeletal ROS (+)   Abdominal   Peds negative pediatric ROS (+)  Hematology negative hematology ROS (+)   Anesthesia Other Findings   Reproductive/Obstetrics negative OB ROS                             Anesthesia Physical Anesthesia Plan  ASA: I  Anesthesia Plan: General   Post-op Pain Management:  Regional for Post-op pain   Induction: Intravenous  PONV Risk Score and Plan: 3 and Ondansetron and Treatment may vary due to age or medical condition  Airway Management Planned: LMA  Additional Equipment:   Intra-op Plan:   Post-operative Plan: Extubation in OR  Informed Consent: I have reviewed the patients History and Physical, chart, labs and discussed the procedure including the risks, benefits and alternatives for the proposed anesthesia with the patient or authorized representative who has indicated his/her understanding and acceptance.   Dental advisory given  Plan Discussed with: CRNA  Anesthesia Plan Comments:         Anesthesia Quick Evaluation

## 2018-04-07 NOTE — H&P (Signed)
Kim Ponce is an 67 y.o. female.   Chief Complaint: left distal radius fracture HPI: 67 yo female states she injured left wrist in fall at work.  Seen in ED where XR revealed left distal radius fracture.  Closed reduction performed by ED.  Splinted and followed up in office.  She wishes to proceed with operative fixation.  Allergies:  Allergies  Allergen Reactions  . Morphine And Related Hives  . Penicillins Hives                       . Sulfa Antibiotics Other (See Comments)    REACTION:  unknown  . Codeine Rash    Past Medical History:  Diagnosis Date  . Arthritis    RA  . Closed fracture of left distal radius   . Wears glasses     Past Surgical History:  Procedure Laterality Date  . ABDOMINAL HYSTERECTOMY    . BACK SURGERY     lumbar disectomy  . CARPAL TUNNEL RELEASE Left 11/13/2013   Procedure: LEFT CARPAL TUNNEL RELEASE;  Surgeon: Nestor Lewandowsky, MD;  Location: [facility];  Service: Orthopedics;  Laterality: Left;  . JOINT REPLACEMENT    . SHOULDER ARTHROSCOPY WITH ROTATOR CUFF REPAIR Left 11/13/2013   Procedure: LEFT SHOULDER ARTHROSCOPY WITH DISTAL CLAVICLE ACROMIOPLASTY, LABRAL AND PARTIAL ROTATOR CUFF TEAR DEBRIDEMENT, SUBACROMIAL DECOMPRESSION;  Surgeon: Nestor Lewandowsky, MD;  Location: [facility];  Service: Orthopedics;  Laterality: Left;  . TOTAL KNEE ARTHROPLASTY  09/26/2012   LEFT KNEE           Dr Turner Daniels   . TOTAL KNEE ARTHROPLASTY  09/26/2012   Procedure: TOTAL KNEE ARTHROPLASTY;  Surgeon: Nestor Lewandowsky, MD;  Location: [facility] OR;  Service: Orthopedics;  Laterality: Left;    Family History: History reviewed. No pertinent family history.  Social History:   reports that she has never smoked. She has never used smokeless tobacco. She reports that she drinks about 4.0 standard drinks of alcohol per week. She reports that she does not use drugs.  Medications: Medications Prior to Admission  Medication Sig Dispense  Refill  . celecoxib (CELEBREX) 200 MG capsule Take 200 mg by mouth 2 (two) times daily.    Marland Kitchen HYDROcodone-acetaminophen (NORCO) 5-325 MG tablet Take 1-2 tablets by mouth every 4 (four) hours as needed for severe pain. 10 tablet 0  . ibuprofen (ADVIL,MOTRIN) 200 MG tablet Take 200 mg by mouth every 6 (six) hours as needed.    . methotrexate 2.5 MG tablet Take 2.5 mg by mouth 3 (three) times a week. TAKE 8 TABS WEEKLY      No results found for this or any previous visit (from the past 48 hour(s)).  No results found.   A comprehensive review of systems was negative.  Blood pressure (!) 148/76, pulse 66, temperature 99.2 F (37.3 C), temperature source Oral, resp. rate 18, height 5\' 6"  (1.676 m), weight 63.5 kg, SpO2 100 %.  General appearance: alert, cooperative and appears stated age Head: Normocephalic, without obvious abnormality, atraumatic Neck: supple, symmetrical, trachea midline Cardio: regular rate and rhythm Resp: clear to auscultation bilaterally Extremities: Intact sensation and capillary refill all digits.  +epl/fpl/io.  No wounds.  Pulses: 2+ and symmetric Skin: Skin color, texture, turgor normal. No rashes or lesions Neurologic: Grossly normal Incision/Wound: none  Assessment/Plan Left distal radius fracture.  Non operative and operative treatment options were discussed with the patient and patient wishes to proceed  with operative treatment. Risks, benefits, and alternatives of surgery were discussed and the patient agrees with the plan of care.   Artyom Stencel R 04/07/2018, 9:31 AM

## 2018-04-07 NOTE — Transfer of Care (Signed)
Immediate Anesthesia Transfer of Care Note  Patient: Kim Ponce  Procedure(s) Performed: OPEN REDUCTION INTERNAL FIXATION (ORIF) DISTAL RADIAL FRACTURE (Left Wrist)  Patient Location: PACU  Anesthesia Type:General and Regional  Level of Consciousness: awake, alert  and oriented  Airway & Oxygen Therapy: Patient Spontanous Breathing and Patient connected to face mask oxygen  Post-op Assessment: Report given to RN and Post -op Vital signs reviewed and stable  Post vital signs: Reviewed and stable  Last Vitals:  Vitals Value Taken Time  BP 129/78 04/07/2018 12:50 PM  Temp    Pulse 78 04/07/2018 12:53 PM  Resp 13 04/07/2018 12:53 PM  SpO2 97 % 04/07/2018 12:53 PM  Vitals shown include unvalidated device data.  Last Pain:  Vitals:   04/07/18 0926  TempSrc: Oral  PainSc: 0-No pain         Complications: No apparent anesthesia complications

## 2018-04-07 NOTE — Progress Notes (Signed)
Assisted Dr. Oddono with left, ultrasound guided, supraclavicular block. Side rails up, monitors on throughout procedure. See vital signs in flow sheet. Tolerated Procedure well. 

## 2018-04-08 ENCOUNTER — Encounter (HOSPITAL_BASED_OUTPATIENT_CLINIC_OR_DEPARTMENT_OTHER): Payer: Self-pay | Admitting: Orthopedic Surgery

## 2018-06-09 ENCOUNTER — Other Ambulatory Visit: Payer: Self-pay | Admitting: Physician Assistant

## 2018-06-09 DIAGNOSIS — Z13820 Encounter for screening for osteoporosis: Secondary | ICD-10-CM

## 2018-08-15 ENCOUNTER — Other Ambulatory Visit: Payer: Self-pay

## 2019-12-19 ENCOUNTER — Other Ambulatory Visit: Payer: Self-pay | Admitting: Orthopedic Surgery

## 2019-12-19 NOTE — Patient Instructions (Addendum)
DUE TO COVID-19 ONLY ONE VISITOR IS ALLOWED TO COME WITH YOU AND STAY IN THE WAITING ROOM ONLY DURING PRE OP AND PROCEDURE DAY OF SURGERY. TWO  VISITOR MAY VISIT WITH YOU AFTER SURGERY IN YOUR PRIVATE ROOM DURING VISITING HOURS ONLY! 10a-8p  YOU NEED TO HAVE A COVID 19 TEST ON___4-29-21____ @__12 :00_____, THIS TEST MUST BE DONE BEFORE SURGERY, COME  [address], Kim Ponce , .  [facility]) ONCE YOUR COVID TEST IS COMPLETED, PLEASE BEGIN THE QUARANTINE INSTRUCTIONS AS OUTLINED IN YOUR HANDOUT.                Kim Ponce  12/19/2019   Your procedure is scheduled on: 12-25-19    Report to [facility] Main  Entrance   Report to admitting at     1030 AM     Call this number if you have problems the morning of surgery [phone]    Remember: NO SOLID FOOD AFTER MIDNIGHT THE NIGHT PRIOR TO SURGERY. NOTHING BY MOUTH EXCEPT CLEAR LIQUIDS UNTIL  1000 am  . PLEASE FINISH ENSURE DRINK PER SURGEON ORDER  WHICH NEEDS TO BE COMPLETED AT   1000 am then nothing by mouth.    CLEAR LIQUID DIET  Nothing by mouth after 10:00 am   Foods Allowed                                                                              Foods Excluded  Coffee and tea, regular and decaf  No creamer                           liquids that you cannot  Plain Jell-O any favor except red or purple                                           see through such as: Fruit ices (not with fruit pulp)                                                           milk, soups, orange juice  Iced Popsicles                                                            All solid food Carbonated beverages, regular and diet                                    Cranberry, grape and apple juices Sports drinks like Gatorade Lightly seasoned clear broth or consume(fat free) Sugar, honey syrup   _____________________________________________________________________    BRUSH YOUR TEETH  MORNING OF SURGERY AND  RINSE YOUR MOUTH OUT, NO CHEWING GUM CANDY OR MINTS.     Take these medicines the morning of surgery with A SIP OF WATER:   Hydrocodone if needed                                 You may not have any metal on your body including hair pins and              piercings  Do not wear jewelry, make-up, lotions, powders or perfumes, deodorant             Do not wear nail polish on your fingernails.  Do not shave  48 hours prior to surgery.                Do not bring valuables to the hospital. [place].  Contacts, dentures or bridgework may not be worn into surgery.       Patients discharged the day of surgery will not be allowed to drive home. IF YOU ARE HAVING SURGERY AND GOING HOME THE SAME DAY, YOU MUST HAVE AN ADULT TO DRIVE YOU HOME AND BE WITH YOU FOR 24 HOURS. YOU MAY GO HOME BY TAXI OR UBER OR ORTHERWISE, BUT AN ADULT MUST ACCOMPANY YOU HOME AND STAY WITH YOU FOR 24 HOURS.  Name and phone number of your driver:  Special Instructions: N/A              Please read over the following fact sheets you were given: _____________________________________________________________________             [facility] - Preparing for Surgery Before surgery, you can play an important role.  Because skin is not sterile, your skin needs to be as free of germs as possible.  You can reduce the number of germs on your skin by washing with CHG (chlorahexidine gluconate) soap before surgery.  CHG is an antiseptic cleaner which kills germs and bonds with the skin to continue killing germs even after washing. Please DO NOT use if you have an allergy to CHG or antibacterial soaps.  If your skin becomes reddened/irritated stop using the CHG and inform your nurse when you arrive at Short Stay. Do not shave (including legs and underarms) for at least 48 hours prior to the first CHG shower.  You may shave your face/neck. Please follow these instructions  carefully:  1.  Shower with CHG Soap the night before surgery and the  morning of Surgery.  2.  If you choose to wash your hair, wash your hair first as usual with your  normal  shampoo.  3.  After you shampoo, rinse your hair and body thoroughly to remove the  shampoo.                           4.  Use CHG as you would any other liquid soap.  You can apply chg directly  to the skin and wash                       Gently with a scrungie or clean washcloth.  5.  Apply the CHG Soap to your body ONLY FROM THE NECK DOWN.   Do not use on face/  open                           Wound or open sores. Avoid contact with eyes, ears mouth and genitals (private parts).                       Wash face,  Genitals (private parts) with your normal soap.             6.  Wash thoroughly, paying special attention to the area where your surgery  will be performed.  7.  Thoroughly rinse your body with warm water from the neck down.  8.  DO NOT shower/wash with your normal soap after using and rinsing off  the CHG Soap.                9.  Pat yourself dry with a clean towel.            10.  Wear clean pajamas.            11.  Place clean sheets on your bed the night of your first shower and do not  sleep with pets. Day of Surgery : Do not apply any lotions/deodorants the morning of surgery.  Please wear clean clothes to the hospital/surgery center.  FAILURE TO FOLLOW THESE INSTRUCTIONS MAY RESULT IN THE CANCELLATION OF YOUR SURGERY PATIENT SIGNATURE_________________________________  NURSE SIGNATURE__________________________________  ________________________________________________________________________   Kim Ponce  An incentive spirometer is a tool that can help keep your lungs clear and active. This tool measures how well you are filling your lungs with each breath. Taking long deep breaths may help reverse or decrease the chance of developing breathing (pulmonary) problems (especially infection)  following:  A long period of time when you are unable to move or be active. BEFORE THE PROCEDURE   If the spirometer includes an indicator to show your best effort, your nurse or respiratory therapist will set it to a desired goal.  If possible, sit up straight or lean slightly forward. Try not to slouch.  Hold the incentive spirometer in an upright position. INSTRUCTIONS FOR USE  1. Sit on the edge of your bed if possible, or sit up as far as you can in bed or on a chair. 2. Hold the incentive spirometer in an upright position. 3. Breathe out normally. 4. Place the mouthpiece in your mouth and seal your lips tightly around it. 5. Breathe in slowly and as deeply as possible, raising the piston or the ball toward the top of the column. 6. Hold your breath for 3-5 seconds or for as long as possible. Allow the piston or ball to fall to the bottom of the column. 7. Remove the mouthpiece from your mouth and breathe out normally. 8. Rest for a few seconds and repeat Steps 1 through 7 at least 10 times every 1-2 hours when you are awake. Take your time and take a few normal breaths between deep breaths. 9. The spirometer may include an indicator to show your best effort. Use the indicator as a goal to work toward during each repetition. 10. After each set of 10 deep breaths, practice coughing to be sure your lungs are clear. If you have an incision (the cut made at the time of surgery), support your incision when coughing by placing a pillow or rolled up towels firmly against it. Once you are able to get out of bed, walk around indoors and  cough well. You may stop using the incentive spirometer when instructed by your caregiver.  RISKS AND COMPLICATIONS  Take your time so you do not get dizzy or light-headed.  If you are in pain, you may need to take or ask for pain medication before doing incentive spirometry. It is harder to take a deep breath if you are having pain. AFTER USE  Rest and  breathe slowly and easily.  It can be helpful to keep track of a log of your progress. Your caregiver can provide you with a simple table to help with this. If you are using the spirometer at home, follow these instructions: [place] IF:   You are having difficultly using the spirometer.  You have trouble using the spirometer as often as instructed.  Your pain medication is not giving enough relief while using the spirometer.  You develop fever of 100.5 F (38.1 C) or higher. SEEK IMMEDIATE MEDICAL CARE IF:   You cough up bloody sputum that had not been present before.  You develop fever of 102 F (38.9 C) or greater.  You develop worsening pain at or near the incision site. MAKE SURE YOU:   Understand these instructions.  Will watch your condition.  Will get help right away if you are not doing well or get worse. Document Released: 12/21/2006 Document Revised: 11/02/2011 Document Reviewed: 02/21/2007 Exit[employer] Patient Information 2014 [employer], [place].   ________________________________________________________________________  WHAT IS A BLOOD TRANSFUSION? Blood Transfusion Information  A transfusion is the replacement of blood or some of its parts. Blood is made up of multiple cells which provide different functions.  Red blood cells carry oxygen and are used for blood loss replacement.  White blood cells fight against infection.  Platelets control bleeding.  Plasma helps clot blood.  Other blood products are available for specialized needs, such as hemophilia or other clotting disorders. BEFORE THE TRANSFUSION  Who gives blood for transfusions?   Healthy volunteers who are fully evaluated to make sure their blood is safe. This is blood bank blood. Transfusion therapy is the safest it has ever been in the practice of medicine. Before blood is taken from a donor, a complete history is taken to make sure that person has no history of diseases nor engages in  risky social behavior (examples are intravenous drug use or sexual activity with multiple partners). The donor's travel history is screened to minimize risk of transmitting infections, such as malaria. The donated blood is tested for signs of infectious diseases, such as HIV and hepatitis. The blood is then tested to be sure it is compatible with you in order to minimize the chance of a transfusion reaction. If you or a relative donates blood, this is often done in anticipation of surgery and is not appropriate for emergency situations. It takes many days to process the donated blood. RISKS AND COMPLICATIONS Although transfusion therapy is very safe and saves many lives, the main dangers of transfusion include:   Getting an infectious disease.  Developing a transfusion reaction. This is an allergic reaction to something in the blood you were given. Every precaution is taken to prevent this. The decision to have a blood transfusion has been considered carefully by your caregiver before blood is given. Blood is not given unless the benefits outweigh the risks. AFTER THE TRANSFUSION  Right after receiving a blood transfusion, you will usually feel much better and more energetic. This is especially true if your red blood cells have gotten low (anemic).  The transfusion raises the level of the red blood cells which carry oxygen, and this usually causes an energy increase.  The nurse administering the transfusion will monitor you carefully for complications. HOME CARE INSTRUCTIONS  No special instructions are needed after a transfusion. You may find your energy is better. Speak with your caregiver about any limitations on activity for underlying diseases you may have. SEEK MEDICAL CARE IF:   Your condition is not improving after your transfusion.  You develop redness or irritation at the intravenous (IV) site. SEEK IMMEDIATE MEDICAL CARE IF:  Any of the following symptoms occur over the next 12  hours:  Shaking chills.  You have a temperature by mouth above 102 F (38.9 C), not controlled by medicine.  Chest, back, or muscle pain.  People around you feel you are not acting correctly or are confused.  Shortness of breath or difficulty breathing.  Dizziness and fainting.  You get a rash or develop hives.  You have a decrease in urine output.  Your urine turns a dark color or changes to pink, red, or brown. Any of the following symptoms occur over the next 10 days:  You have a temperature by mouth above 102 F (38.9 C), not controlled by medicine.  Shortness of breath.  Weakness after normal activity.  The white part of the eye turns yellow (jaundice).  You have a decrease in the amount of urine or are urinating less often.  Your urine turns a dark color or changes to pink, red, or brown. Document Released: 08/07/2000 Document Revised: 11/02/2011 Document Reviewed: 03/26/2008 [facility] Patient Information 2014 [place], [place].  _______________________________________________________________________

## 2019-12-19 NOTE — Progress Notes (Addendum)
PCP - Alben Deeds  Cardiologist -   Chest x-ray - 12-21-19 epic EKG - 12-21-19 epic Stress Test -  ECHO -  Cardiac Cath -   Sleep Study -  CPAP -   Fasting Blood Sugar -  Checks Blood Sugar _____ times a day  Blood Thinner Instructions: Aspirin Instructions: Last Dose:  Anesthesia review:   Patient denies shortness of breath, fever, cough and chest pain at PAT appointment   none   Patient verbalized understanding of instructions that were given to them at the PAT appointment. Patient was also instructed that they will need to review over the PAT instructions again at home before surgery.

## 2019-12-20 ENCOUNTER — Other Ambulatory Visit: Payer: Self-pay

## 2019-12-20 ENCOUNTER — Encounter (HOSPITAL_COMMUNITY): Payer: Self-pay

## 2019-12-20 ENCOUNTER — Encounter (HOSPITAL_COMMUNITY)
Admission: RE | Admit: 2019-12-20 | Discharge: 2019-12-20 | Disposition: A | Discharge status: Home / Self Care | Payer: 59 | Source: Ambulatory Visit | Attending: Orthopedic Surgery | Admitting: Orthopedic Surgery

## 2019-12-20 DIAGNOSIS — Z01818 Encounter for other preprocedural examination: Secondary | ICD-10-CM | POA: Diagnosis not present

## 2019-12-20 HISTORY — DX: Anemia, unspecified: D64.9

## 2019-12-20 NOTE — Progress Notes (Signed)
preop phone call attempted . LVM

## 2019-12-21 ENCOUNTER — Other Ambulatory Visit (HOSPITAL_COMMUNITY)
Admission: RE | Admit: 2019-12-21 | Discharge: 2019-12-21 | Disposition: A | Discharge status: Home / Self Care | Payer: 59 | Source: Ambulatory Visit | Attending: Orthopedic Surgery | Admitting: Orthopedic Surgery

## 2019-12-21 ENCOUNTER — Encounter (HOSPITAL_COMMUNITY)
Admission: RE | Admit: 2019-12-21 | Discharge: 2019-12-21 | Disposition: A | Discharge status: Home / Self Care | Payer: 59 | Source: Ambulatory Visit | Attending: Orthopedic Surgery | Admitting: Orthopedic Surgery

## 2019-12-21 ENCOUNTER — Ambulatory Visit (HOSPITAL_COMMUNITY)
Admission: RE | Admit: 2019-12-21 | Discharge: 2019-12-21 | Disposition: A | Discharge status: Home / Self Care | Payer: 59 | Source: Ambulatory Visit | Attending: Orthopedic Surgery | Admitting: Orthopedic Surgery

## 2019-12-21 DIAGNOSIS — R001 Bradycardia, unspecified: Secondary | ICD-10-CM | POA: Insufficient documentation

## 2019-12-21 DIAGNOSIS — R9431 Abnormal electrocardiogram [ECG] [EKG]: Secondary | ICD-10-CM | POA: Insufficient documentation

## 2019-12-21 DIAGNOSIS — Z01818 Encounter for other preprocedural examination: Secondary | ICD-10-CM | POA: Diagnosis not present

## 2019-12-21 DIAGNOSIS — Z20822 Contact with and (suspected) exposure to covid-19: Secondary | ICD-10-CM | POA: Diagnosis not present

## 2019-12-21 LAB — CBC WITH DIFFERENTIAL/PLATELET
Abs Immature Granulocytes: 0.03 10*3/uL (ref 0.00–0.07)
Basophils Absolute: 0 10*3/uL (ref 0.0–0.1)
Basophils Relative: 1 %
Eosinophils Absolute: 0.1 10*3/uL (ref 0.0–0.5)
Eosinophils Relative: 1 %
HCT: 33.9 % — ABNORMAL LOW (ref 36.0–46.0)
Hemoglobin: 11 g/dL — ABNORMAL LOW (ref 12.0–15.0)
Immature Granulocytes: 1 %
Lymphocytes Relative: 34 %
Lymphs Abs: 1.9 10*3/uL (ref 0.7–4.0)
MCH: 34.7 pg — ABNORMAL HIGH (ref 26.0–34.0)
MCHC: 32.4 g/dL (ref 30.0–36.0)
MCV: 106.9 fL — ABNORMAL HIGH (ref 80.0–100.0)
Monocytes Absolute: 0.4 10*3/uL (ref 0.1–1.0)
Monocytes Relative: 8 %
Neutro Abs: 3.1 10*3/uL (ref 1.7–7.7)
Neutrophils Relative %: 55 %
Platelets: 191 10*3/uL (ref 150–400)
RBC: 3.17 MIL/uL — ABNORMAL LOW (ref 3.87–5.11)
RDW: 14.5 % (ref 11.5–15.5)
WBC: 5.5 10*3/uL (ref 4.0–10.5)
nRBC: 0 % (ref 0.0–0.2)

## 2019-12-21 LAB — URINALYSIS, ROUTINE W REFLEX MICROSCOPIC
Bilirubin Urine: NEGATIVE
Glucose, UA: NEGATIVE mg/dL
Hgb urine dipstick: NEGATIVE
Ketones, ur: NEGATIVE mg/dL
Nitrite: NEGATIVE
Protein, ur: 30 mg/dL — AB
Specific Gravity, Urine: 1.025 (ref 1.005–1.030)
pH: 5 (ref 5.0–8.0)

## 2019-12-21 LAB — SARS CORONAVIRUS 2 (TAT 6-24 HRS): SARS Coronavirus 2: NEGATIVE

## 2019-12-21 LAB — BASIC METABOLIC PANEL
Anion gap: 7 (ref 5–15)
BUN: 19 mg/dL (ref 8–23)
CO2: 23 mmol/L (ref 22–32)
Calcium: 9.1 mg/dL (ref 8.9–10.3)
Chloride: 109 mmol/L (ref 98–111)
Creatinine, Ser: 0.67 mg/dL (ref 0.44–1.00)
GFR calc Af Amer: >60 mL/min (ref >60)
GFR calc non Af Amer: >60 mL/min (ref >60)
Glucose, Bld: 102 mg/dL — ABNORMAL HIGH (ref 70–99)
Potassium: 4.2 mmol/L (ref 3.5–5.1)
Sodium: 139 mmol/L (ref 135–145)

## 2019-12-21 LAB — ABO/RH: ABO/RH(D): A POS

## 2019-12-21 LAB — SURGICAL PCR SCREEN
MRSA, PCR: NEGATIVE
Staphylococcus aureus: NEGATIVE

## 2019-12-21 LAB — APTT: aPTT: 29 seconds (ref 24–36)

## 2019-12-21 LAB — PROTIME-INR
INR: 1 (ref 0.8–1.2)
Prothrombin Time: 12.8 seconds (ref 11.4–15.2)

## 2019-12-22 DIAGNOSIS — M1612 Unilateral primary osteoarthritis, left hip: Secondary | ICD-10-CM | POA: Diagnosis present

## 2019-12-22 NOTE — H&P (Signed)
TOTAL HIP ADMISSION H&P  Patient is admitted for left total hip arthroplasty.  Subjective:  Chief Complaint: left hip pain  HPI: Kim Ponce, 69 y.o. female, has a history of pain and functional disability in the left hip(s) due to arthritis and patient has failed non-surgical conservative treatments for greater than 12 weeks to include NSAID's and/or analgesics, use of assistive devices, weight reduction as appropriate and activity modification.  Onset of symptoms was gradual starting several years ago with gradually worsening course since that time.The patient noted no past surgery on the left hip(s).  Patient currently rates pain in the left hip at 10 out of 10 with activity. Patient has night pain, worsening of pain with activity and weight bearing, trendelenberg gait, pain that interfers with activities of daily living and pain with passive range of motion. Patient has evidence of periarticular osteophytes and joint space narrowing by imaging studies. This condition presents safety issues increasing the risk of falls.    There is no current active infection.  Patient Active Problem List   Diagnosis Date Noted  . Osteoarthritis of left knee 09/28/2012   Past Medical History:  Diagnosis Date  . Anemia   . Arthritis    RA and osteoarthritis  . Closed fracture of left distal radius   . Wears glasses     Past Surgical History:  Procedure Laterality Date  . ABDOMINAL HYSTERECTOMY    . BACK SURGERY     lumbar disectomy  . CARPAL TUNNEL RELEASE Left 11/13/2013   Procedure: LEFT CARPAL TUNNEL RELEASE;  Surgeon: Nestor Lewandowsky, MD;  Location: [facility];  Service: Orthopedics;  Laterality: Left;  . JOINT REPLACEMENT    . OPEN REDUCTION INTERNAL FIXATION (ORIF) DISTAL RADIAL FRACTURE Left 04/07/2018   Procedure: OPEN REDUCTION INTERNAL FIXATION (ORIF) DISTAL RADIAL FRACTURE;  Surgeon: Betha Loa, MD;  Location: [facility];  Service: Orthopedics;  Laterality:  Left;  . SHOULDER ARTHROSCOPY WITH ROTATOR CUFF REPAIR Left 11/13/2013   Procedure: LEFT SHOULDER ARTHROSCOPY WITH DISTAL CLAVICLE ACROMIOPLASTY, LABRAL AND PARTIAL ROTATOR CUFF TEAR DEBRIDEMENT, SUBACROMIAL DECOMPRESSION;  Surgeon: Nestor Lewandowsky, MD;  Location: [facility];  Service: Orthopedics;  Laterality: Left;  . TOTAL KNEE ARTHROPLASTY  09/26/2012   LEFT KNEE           Dr Turner Daniels   . TOTAL KNEE ARTHROPLASTY  09/26/2012   Procedure: TOTAL KNEE ARTHROPLASTY;  Surgeon: Nestor Lewandowsky, MD;  Location: [facility];  Service: Orthopedics;  Laterality: Left;    No current facility-administered medications for this encounter.   Current Outpatient Medications  Medication Sig Dispense Refill Last Dose  . celecoxib (CELEBREX) 200 MG capsule Take 200 mg by mouth 2 (two) times daily.     Marland Kitchen HYDROcodone-acetaminophen (NORCO) 5-325 MG tablet Take 1-2 tablets by mouth every 4 (four) hours as needed for severe pain. 10 tablet 0   . HYDROcodone-acetaminophen (NORCO) 5-325 MG tablet 1-2 tabs po q6 hours prn pain 30 tablet 0   . ibuprofen (ADVIL,MOTRIN) 200 MG tablet Take 200 mg by mouth every 6 (six) hours as needed.     . methotrexate 2.5 MG tablet Take 2.5 mg by mouth 3 (three) times a week. TAKE 8 TABS WEEKLY      Allergies  Allergen Reactions  . Morphine And Related Hives  . Penicillins Hives                       . Sulfa  Antibiotics Other (See Comments)    REACTION:  unknown  . Codeine Rash    Social History   Tobacco Use  . Smoking status: Never Smoker  . Smokeless tobacco: Never Used  Substance Use Topics  . Alcohol use: Not Currently    Alcohol/week: 4.0 standard drinks    Types: 4 Glasses of wine per week    Comment: occassional    No family history on file.   Review of Systems  Constitutional: Negative.   HENT: Negative.   Eyes: Negative.   Respiratory: Negative.   Cardiovascular: Negative.   Gastrointestinal: Negative.   Endocrine: Negative.    Genitourinary: Negative.   Musculoskeletal: Positive for arthralgias and myalgias.       RA and OA  Skin: Negative.   Allergic/Immunologic: Negative.   Neurological: Negative.   Hematological: Negative.   Psychiatric/Behavioral: Negative.     Objective:  Physical Exam  Constitutional: She is oriented to person, place, and time. She appears well-developed and well-nourished.  HENT:  Head: Normocephalic and atraumatic.  Eyes: Pupils are equal, round, and reactive to light.  Cardiovascular: Intact distal pulses.  Respiratory: Effort normal.  Musculoskeletal:        General: Tenderness present.     Cervical back: Normal range of motion and neck supple.     Comments: the patient continues to have significant pain with any attempts of internal rotation of the left hip.  Again severely limited internal rotation.  Calves are soft and nontender.  Neurological: She is alert and oriented to person, place, and time.  Skin: Skin is warm and dry.  Psychiatric: She has a normal mood and affect. Her behavior is normal. Judgment and thought content normal.    Vital signs in last 24 hours:    Labs:   Estimated body mass index is 21.32 kg/m as calculated from the following:   Height as of 12/21/19: 5\' 6"  (1.676 m).   Weight as of 12/21/19: 59.9 kg.   Imaging Review Plain radiographs demonstrate  AP lateral and sunrise views of the left total knee show well-placed well fixed DePuy Sigma RP prosthesis no evidence of loosening she does have arthritis in the contralateral right knee.  AP and crosstable lateral of the left hip show end-stage arthritis bone-on-bone with erosion of the femoral head small peripheral osteophytes, fairly classic findings for end-stage rheumatoid arthritis.     Assessment/Plan:  End stage arthritis, left hip(s)  The patient history, physical examination, clinical judgement of the provider and imaging studies are consistent with end stage degenerative joint  disease of the left hip(s) and total hip arthroplasty is deemed medically necessary. The treatment options including medical management, injection therapy, arthroscopy and arthroplasty were discussed at length. The risks and benefits of total hip arthroplasty were presented and reviewed. The risks due to aseptic loosening, infection, stiffness, dislocation/subluxation,  thromboembolic complications and other imponderables were discussed.  The patient acknowledged the explanation, agreed to proceed with the plan and consent was signed. Patient is being admitted for inpatient treatment for surgery, pain control, PT, OT, prophylactic antibiotics, VTE prophylaxis, progressive ambulation and ADL's and discharge planning.The patient is planning to be discharged home with home health services    Patient's anticipated LOS is less than 2 midnights, meeting these requirements: - Younger than 2 - Lives within 1 hour of care - Has a competent adult at home to recover with post-op recover - NO history of  - Chronic pain requiring opiods  - Diabetes  - Coronary  Artery Disease  - Heart failure  - Heart attack  - Stroke  - DVT/VTE  - Cardiac arrhythmia  - Respiratory Failure/COPD  - Renal failure  - Anemia  - Advanced Liver disease

## 2019-12-24 MED ORDER — BUPIVACAINE LIPOSOME 1.3 % IJ SUSP
10.0000 mL | Freq: Once | INTRAMUSCULAR | Status: DC
  Filled 2019-12-24: qty 10

## 2019-12-24 MED ORDER — TRANEXAMIC ACID 1000 MG/10ML IV SOLN
2000.0000 mg | INTRAVENOUS | Status: DC
  Filled 2019-12-24: qty 20

## 2019-12-25 ENCOUNTER — Other Ambulatory Visit: Payer: Self-pay

## 2019-12-25 ENCOUNTER — Encounter (HOSPITAL_COMMUNITY): Payer: Self-pay | Admitting: Orthopedic Surgery

## 2019-12-25 ENCOUNTER — Observation Stay (HOSPITAL_COMMUNITY)
Admission: RE | Admit: 2019-12-25 | Discharge: 2019-12-26 | Disposition: A | Discharge status: Home / Self Care | Payer: 59 | Source: Ambulatory Visit | Attending: Orthopedic Surgery | Admitting: Orthopedic Surgery

## 2019-12-25 ENCOUNTER — Ambulatory Visit (HOSPITAL_COMMUNITY): Payer: 59 | Admitting: Registered Nurse

## 2019-12-25 ENCOUNTER — Encounter (HOSPITAL_COMMUNITY)
Admission: RE | Disposition: A | Discharge status: Home / Self Care | Payer: Self-pay | Source: Ambulatory Visit | Attending: Orthopedic Surgery

## 2019-12-25 ENCOUNTER — Ambulatory Visit (HOSPITAL_COMMUNITY): Payer: 59

## 2019-12-25 DIAGNOSIS — M069 Rheumatoid arthritis, unspecified: Principal | ICD-10-CM | POA: Insufficient documentation

## 2019-12-25 DIAGNOSIS — Z79899 Other long term (current) drug therapy: Secondary | ICD-10-CM | POA: Insufficient documentation

## 2019-12-25 DIAGNOSIS — Z96642 Presence of left artificial hip joint: Secondary | ICD-10-CM

## 2019-12-25 DIAGNOSIS — M1612 Unilateral primary osteoarthritis, left hip: Secondary | ICD-10-CM

## 2019-12-25 DIAGNOSIS — Z419 Encounter for procedure for purposes other than remedying health state, unspecified: Secondary | ICD-10-CM

## 2019-12-25 HISTORY — PX: TOTAL HIP ARTHROPLASTY: SHX124

## 2019-12-25 LAB — TYPE AND SCREEN
ABO/RH(D): A POS
Antibody Screen: NEGATIVE

## 2019-12-25 SURGERY — ARTHROPLASTY, HIP, TOTAL, ANTERIOR APPROACH
Anesthesia: Spinal | Site: Hip | Laterality: Left

## 2019-12-25 MED ORDER — EPHEDRINE SULFATE-NACL 50-0.9 MG/10ML-% IV SOSY
PREFILLED_SYRINGE | INTRAVENOUS | Status: DC | PRN
  Administered 2019-12-25: 15 mg via INTRAVENOUS
  Administered 2019-12-25: 10 mg via INTRAVENOUS

## 2019-12-25 MED ORDER — HYDROMORPHONE HCL 1 MG/ML IJ SOLN
0.5000 mg | INTRAMUSCULAR | Status: DC | PRN
  Administered 2019-12-25: 19:00:00 0.5 mg via INTRAVENOUS
  Administered 2019-12-26: 1 mg via INTRAVENOUS
  Filled 2019-12-25 (×2): qty 1

## 2019-12-25 MED ORDER — PROPOFOL 10 MG/ML IV BOLUS
INTRAVENOUS | Status: DC | PRN
  Administered 2019-12-25: 30 mg via INTRAVENOUS
  Administered 2019-12-25: 20 mg via INTRAVENOUS
  Administered 2019-12-25: 30 mg via INTRAVENOUS
  Administered 2019-12-25 (×2): 20 mg via INTRAVENOUS

## 2019-12-25 MED ORDER — ACETAMINOPHEN 500 MG PO TABS
1000.0000 mg | ORAL_TABLET | Freq: Four times a day (QID) | ORAL | Status: DC
  Administered 2019-12-25 – 2019-12-26 (×3): 1000 mg via ORAL
  Filled 2019-12-25 (×3): qty 2

## 2019-12-25 MED ORDER — ONDANSETRON HCL 4 MG/2ML IJ SOLN: 4.0000 mg | Freq: Four times a day (QID) | INTRAMUSCULAR | Status: DC | PRN

## 2019-12-25 MED ORDER — ASPIRIN EC 81 MG PO TBEC
81.0000 mg | DELAYED_RELEASE_TABLET | Freq: Two times a day (BID) | ORAL | 0 refills | Status: DC
Start: 2019-12-25 — End: 2020-09-18

## 2019-12-25 MED ORDER — PANTOPRAZOLE SODIUM 40 MG PO TBEC
40.0000 mg | DELAYED_RELEASE_TABLET | Freq: Every day | ORAL | Status: DC
  Administered 2019-12-25 – 2019-12-26 (×2): 40 mg via ORAL
  Filled 2019-12-25 (×2): qty 1

## 2019-12-25 MED ORDER — PROPOFOL 1000 MG/100ML IV EMUL
INTRAVENOUS | Status: AC
  Filled 2019-12-25: qty 100

## 2019-12-25 MED ORDER — MIDAZOLAM HCL 2 MG/2ML IJ SOLN
INTRAMUSCULAR | Status: AC
  Filled 2019-12-25: qty 2

## 2019-12-25 MED ORDER — BUPIVACAINE-EPINEPHRINE (PF) 0.5% -1:200000 IJ SOLN
INTRAMUSCULAR | Status: AC
  Filled 2019-12-25: qty 30

## 2019-12-25 MED ORDER — ASPIRIN 81 MG PO CHEW
81.0000 mg | CHEWABLE_TABLET | Freq: Two times a day (BID) | ORAL | Status: DC
  Administered 2019-12-25 – 2019-12-26 (×2): 81 mg via ORAL
  Filled 2019-12-25 (×2): qty 1

## 2019-12-25 MED ORDER — BUPIVACAINE-EPINEPHRINE 0.5% -1:200000 IJ SOLN
INTRAMUSCULAR | Status: DC | PRN
  Administered 2019-12-25: 30 mL

## 2019-12-25 MED ORDER — BUPIVACAINE IN DEXTROSE 0.75-8.25 % IT SOLN
INTRATHECAL | Status: DC | PRN
  Administered 2019-12-25: 2 mL via INTRATHECAL

## 2019-12-25 MED ORDER — DEXAMETHASONE SODIUM PHOSPHATE 10 MG/ML IJ SOLN
INTRAMUSCULAR | Status: AC
  Filled 2019-12-25: qty 1

## 2019-12-25 MED ORDER — CELECOXIB 200 MG PO CAPS
200.0000 mg | ORAL_CAPSULE | Freq: Two times a day (BID) | ORAL | Status: DC
  Administered 2019-12-25 – 2019-12-26 (×2): 200 mg via ORAL
  Filled 2019-12-25 (×2): qty 1

## 2019-12-25 MED ORDER — STERILE WATER FOR IRRIGATION IR SOLN
Status: DC | PRN
  Administered 2019-12-25: 2000 mL

## 2019-12-25 MED ORDER — KCL IN DEXTROSE-NACL 20-5-0.45 MEQ/L-%-% IV SOLN
INTRAVENOUS | Status: DC
  Filled 2019-12-25 (×3): qty 1000

## 2019-12-25 MED ORDER — LIDOCAINE 2% (20 MG/ML) 5 ML SYRINGE
INTRAMUSCULAR | Status: AC
  Filled 2019-12-25: qty 5

## 2019-12-25 MED ORDER — SODIUM CHLORIDE 0.9% FLUSH
INTRAVENOUS | Status: DC | PRN
  Administered 2019-12-25: 50 mL via INTRAVENOUS

## 2019-12-25 MED ORDER — DIPHENHYDRAMINE HCL 12.5 MG/5ML PO ELIX: 12.5000 mg | ORAL_SOLUTION | ORAL | Status: DC | PRN

## 2019-12-25 MED ORDER — METOCLOPRAMIDE HCL 5 MG/ML IJ SOLN: 5.0000 mg | Freq: Three times a day (TID) | INTRAMUSCULAR | Status: DC | PRN

## 2019-12-25 MED ORDER — METHOCARBAMOL 500 MG IVPB - SIMPLE MED
500.0000 mg | Freq: Four times a day (QID) | INTRAVENOUS | Status: DC | PRN
  Filled 2019-12-25: qty 50

## 2019-12-25 MED ORDER — PHENOL 1.4 % MT LIQD: 1.0000 | OROMUCOSAL | Status: DC | PRN

## 2019-12-25 MED ORDER — ONDANSETRON HCL 4 MG/2ML IJ SOLN
INTRAMUSCULAR | Status: DC | PRN
  Administered 2019-12-25: 4 mg via INTRAVENOUS

## 2019-12-25 MED ORDER — TRANEXAMIC ACID-NACL 1000-0.7 MG/100ML-% IV SOLN
1000.0000 mg | Freq: Once | INTRAVENOUS | Status: AC
  Administered 2019-12-25: 1000 mg via INTRAVENOUS
  Filled 2019-12-25: qty 100

## 2019-12-25 MED ORDER — MIDAZOLAM HCL 5 MG/5ML IJ SOLN
INTRAMUSCULAR | Status: DC | PRN
  Administered 2019-12-25 (×2): 1 mg via INTRAVENOUS

## 2019-12-25 MED ORDER — LACTATED RINGERS IV SOLN: INTRAVENOUS | Status: DC

## 2019-12-25 MED ORDER — PROPOFOL 500 MG/50ML IV EMUL
INTRAVENOUS | Status: DC | PRN
  Administered 2019-12-25: 75 ug/kg/min via INTRAVENOUS

## 2019-12-25 MED ORDER — DEXAMETHASONE SODIUM PHOSPHATE 10 MG/ML IJ SOLN
INTRAMUSCULAR | Status: DC | PRN
  Administered 2019-12-25: 10 mg via INTRAVENOUS

## 2019-12-25 MED ORDER — POLYETHYLENE GLYCOL 3350 17 G PO PACK: 17.0000 g | PACK | Freq: Every day | ORAL | Status: DC | PRN

## 2019-12-25 MED ORDER — EPHEDRINE 5 MG/ML INJ
INTRAVENOUS | Status: AC
  Filled 2019-12-25: qty 10

## 2019-12-25 MED ORDER — ONDANSETRON HCL 4 MG/2ML IJ SOLN
INTRAMUSCULAR | Status: AC
  Filled 2019-12-25: qty 2

## 2019-12-25 MED ORDER — LIDOCAINE 2% (20 MG/ML) 5 ML SYRINGE
INTRAMUSCULAR | Status: DC | PRN
  Administered 2019-12-25: 40 mg via INTRAVENOUS

## 2019-12-25 MED ORDER — TIZANIDINE HCL 2 MG PO TABS
2.0000 mg | ORAL_TABLET | Freq: Four times a day (QID) | ORAL | 0 refills | Status: DC | PRN
Start: 2019-12-25 — End: 2020-09-10

## 2019-12-25 MED ORDER — POVIDONE-IODINE 10 % EX SWAB
2.0000 "application " | Freq: Once | CUTANEOUS | Status: AC
  Administered 2019-12-25: 2 via TOPICAL

## 2019-12-25 MED ORDER — FLEET ENEMA 7-19 GM/118ML RE ENEM: 1.0000 | ENEMA | Freq: Once | RECTAL | Status: DC | PRN

## 2019-12-25 MED ORDER — OXYCODONE HCL 5 MG PO TABS
5.0000 mg | ORAL_TABLET | ORAL | Status: DC | PRN
  Administered 2019-12-25 – 2019-12-26 (×3): 10 mg via ORAL
  Filled 2019-12-25 (×3): qty 2

## 2019-12-25 MED ORDER — SODIUM CHLORIDE (PF) 0.9 % IJ SOLN
INTRAMUSCULAR | Status: AC
  Filled 2019-12-25: qty 50

## 2019-12-25 MED ORDER — VANCOMYCIN HCL IN DEXTROSE 1-5 GM/200ML-% IV SOLN
1000.0000 mg | INTRAVENOUS | Status: AC
  Administered 2019-12-25: 1000 mg via INTRAVENOUS
  Filled 2019-12-25: qty 200

## 2019-12-25 MED ORDER — TRANEXAMIC ACID 1000 MG/10ML IV SOLN
INTRAVENOUS | Status: DC | PRN
  Administered 2019-12-25: 2000 mg via TOPICAL

## 2019-12-25 MED ORDER — METHOCARBAMOL 500 MG PO TABS: 500.0000 mg | ORAL_TABLET | Freq: Four times a day (QID) | ORAL | Status: DC | PRN

## 2019-12-25 MED ORDER — ONDANSETRON HCL 4 MG PO TABS: 4.0000 mg | ORAL_TABLET | Freq: Four times a day (QID) | ORAL | Status: DC | PRN

## 2019-12-25 MED ORDER — MENTHOL 3 MG MT LOZG: 1.0000 | LOZENGE | OROMUCOSAL | Status: DC | PRN

## 2019-12-25 MED ORDER — OXYCODONE-ACETAMINOPHEN 5-325 MG PO TABS: 1.0000 | ORAL_TABLET | ORAL | 0 refills | Status: DC | PRN

## 2019-12-25 MED ORDER — ALUMINUM HYDROXIDE GEL 320 MG/5ML PO SUSP
15.0000 mL | ORAL | Status: DC | PRN
  Filled 2019-12-25: qty 30

## 2019-12-25 MED ORDER — 0.9 % SODIUM CHLORIDE (POUR BTL) OPTIME
TOPICAL | Status: DC | PRN
  Administered 2019-12-25: 1000 mL

## 2019-12-25 MED ORDER — ACETAMINOPHEN 325 MG PO TABS: 325.0000 mg | ORAL_TABLET | Freq: Four times a day (QID) | ORAL | Status: DC | PRN

## 2019-12-25 MED ORDER — BUPIVACAINE LIPOSOME 1.3 % IJ SUSP
INTRAMUSCULAR | Status: DC | PRN
  Administered 2019-12-25: 10 mL

## 2019-12-25 MED ORDER — FENTANYL CITRATE (PF) 100 MCG/2ML IJ SOLN
INTRAMUSCULAR | Status: AC
  Filled 2019-12-25: qty 2

## 2019-12-25 MED ORDER — PROPOFOL 10 MG/ML IV BOLUS
INTRAVENOUS | Status: AC
  Filled 2019-12-25: qty 20

## 2019-12-25 MED ORDER — METOCLOPRAMIDE HCL 5 MG PO TABS: 5.0000 mg | ORAL_TABLET | Freq: Three times a day (TID) | ORAL | Status: DC | PRN

## 2019-12-25 MED ORDER — FENTANYL CITRATE (PF) 100 MCG/2ML IJ SOLN
INTRAMUSCULAR | Status: DC | PRN
  Administered 2019-12-25 (×2): 50 ug via INTRAVENOUS

## 2019-12-25 MED ORDER — GABAPENTIN 100 MG PO CAPS
100.0000 mg | ORAL_CAPSULE | Freq: Three times a day (TID) | ORAL | Status: DC
  Administered 2019-12-25 – 2019-12-26 (×2): 100 mg via ORAL
  Filled 2019-12-25 (×2): qty 1

## 2019-12-25 MED ORDER — BISACODYL 5 MG PO TBEC: 5.0000 mg | DELAYED_RELEASE_TABLET | Freq: Every day | ORAL | Status: DC | PRN

## 2019-12-25 MED ORDER — DEXAMETHASONE SODIUM PHOSPHATE 10 MG/ML IJ SOLN
10.0000 mg | Freq: Once | INTRAMUSCULAR | Status: AC
  Administered 2019-12-26: 10 mg via INTRAVENOUS
  Filled 2019-12-25: qty 1

## 2019-12-25 MED ORDER — DOCUSATE SODIUM 100 MG PO CAPS
100.0000 mg | ORAL_CAPSULE | Freq: Two times a day (BID) | ORAL | Status: DC
  Administered 2019-12-25 – 2019-12-26 (×2): 100 mg via ORAL
  Filled 2019-12-25 (×2): qty 1

## 2019-12-25 MED ORDER — TRANEXAMIC ACID-NACL 1000-0.7 MG/100ML-% IV SOLN
1000.0000 mg | INTRAVENOUS | Status: AC
  Administered 2019-12-25: 1000 mg via INTRAVENOUS
  Filled 2019-12-25: qty 100

## 2019-12-25 SURGICAL SUPPLY — 50 items
BAG DECANTER FOR FLEXI CONT (MISCELLANEOUS) ×3 IMPLANT
BLADE SAW SGTL 18X1.27X75 (BLADE) ×2 IMPLANT
BLADE SAW SGTL 18X1.27X75MM (BLADE) ×1
BLADE SURG SZ10 CARB STEEL (BLADE) ×6 IMPLANT
CNTNR URN SCR LID CUP LEK RST (MISCELLANEOUS) ×1 IMPLANT
CONT SPEC 4OZ STRL OR WHT (MISCELLANEOUS) ×2
COVER PERINEAL POST (MISCELLANEOUS) ×3 IMPLANT
COVER SURGICAL LIGHT HANDLE (MISCELLANEOUS) ×3 IMPLANT
COVER WAND RF STERILE (DRAPES) ×3 IMPLANT
CUP ACETBLR 52 OD 100 SERIES (Hips) ×2 IMPLANT
DECANTER SPIKE VIAL GLASS SM (MISCELLANEOUS) ×6 IMPLANT
DRAPE STERI IOBAN 125X83 (DRAPES) ×3 IMPLANT
DRAPE U-SHAPE 47X51 STRL (DRAPES) ×6 IMPLANT
DRESSING AQUACEL AG SP 3.5X10 (GAUZE/BANDAGES/DRESSINGS) IMPLANT
DRSG AQUACEL AG ADV 3.5X10 (GAUZE/BANDAGES/DRESSINGS) ×3 IMPLANT
DRSG AQUACEL AG SP 3.5X10 (GAUZE/BANDAGES/DRESSINGS) ×3
DURAPREP 26ML APPLICATOR (WOUND CARE) ×3 IMPLANT
ELECT BLADE TIP CTD 4 INCH (ELECTRODE) ×3 IMPLANT
ELECT REM PT RETURN 15FT ADLT (MISCELLANEOUS) ×3 IMPLANT
ELIMINATOR HOLE APEX DEPUY (Hips) ×2 IMPLANT
GLOVE BIO SURGEON STRL SZ7.5 (GLOVE) ×3 IMPLANT
GLOVE BIO SURGEON STRL SZ8.5 (GLOVE) ×3 IMPLANT
GLOVE BIOGEL PI IND STRL 8 (GLOVE) ×1 IMPLANT
GLOVE BIOGEL PI IND STRL 9 (GLOVE) ×1 IMPLANT
GLOVE BIOGEL PI INDICATOR 8 (GLOVE) ×2
GLOVE BIOGEL PI INDICATOR 9 (GLOVE) ×2
GOWN STRL REUS W/TWL XL LVL3 (GOWN DISPOSABLE) ×6 IMPLANT
HEAD CERAMIC DELTA 36 PLUS 1.5 (Hips) ×2 IMPLANT
HOLDER FOLEY CATH W/STRAP (MISCELLANEOUS) ×3 IMPLANT
KIT TURNOVER KIT A (KITS) IMPLANT
LINER NEUTRAL 52X36MM PLUS 4 (Liner) ×2 IMPLANT
MANIFOLD NEPTUNE II (INSTRUMENTS) ×3 IMPLANT
NDL HYPO 21X1.5 SAFETY (NEEDLE) ×2 IMPLANT
NEEDLE HYPO 21X1.5 SAFETY (NEEDLE) ×6 IMPLANT
NS IRRIG 1000ML POUR BTL (IV SOLUTION) ×3 IMPLANT
PACK ANTERIOR HIP CUSTOM (KITS) ×3 IMPLANT
PENCIL SMOKE EVACUATOR (MISCELLANEOUS) IMPLANT
STEM FEMORAL SZ6 HIGH ACTIS (Stem) ×2 IMPLANT
SUT ETHIBOND NAB CT1 #1 30IN (SUTURE) ×3 IMPLANT
SUT VIC AB 0 CT1 27 (SUTURE) ×2
SUT VIC AB 0 CT1 27XBRD ANBCTR (SUTURE) ×1 IMPLANT
SUT VIC AB 1 CTX 36 (SUTURE) ×2
SUT VIC AB 1 CTX36XBRD ANBCTR (SUTURE) ×1 IMPLANT
SUT VIC AB 2-0 CT1 27 (SUTURE)
SUT VIC AB 2-0 CT1 TAPERPNT 27 (SUTURE) IMPLANT
SUT VIC AB 3-0 CT1 27 (SUTURE) ×2
SUT VIC AB 3-0 CT1 TAPERPNT 27 (SUTURE) ×1 IMPLANT
SYR CONTROL 10ML LL (SYRINGE) ×9 IMPLANT
TRAY FOLEY MTR SLVR 16FR STAT (SET/KITS/TRAYS/PACK) IMPLANT
YANKAUER SUCT BULB TIP 10FT TU (MISCELLANEOUS) ×3 IMPLANT

## 2019-12-25 NOTE — Anesthesia Postprocedure Evaluation (Signed)
Anesthesia Post Note  Patient: Kim Ponce  Procedure(s) Performed: LEFT TOTAL HIP ARTHROPLASTY ANTERIOR APPROACH (Left Hip)     Patient location during evaluation: PACU Anesthesia Type: Spinal Level of consciousness: oriented and awake and alert Pain management: pain level controlled Vital Signs Assessment: post-procedure vital signs reviewed and stable Respiratory status: spontaneous breathing, respiratory function stable and nonlabored ventilation Cardiovascular status: blood pressure returned to baseline and stable Postop Assessment: no headache, no backache, no apparent nausea or vomiting and spinal receding Anesthetic complications: no    Last Vitals:  Vitals:   12/25/19 1530 12/25/19 1544  BP: 126/63 116/62  Pulse: (!) 58 (!) 58  Resp: 10 18  Temp:    SpO2: 100% 100%    Last Pain:  Vitals:   12/25/19 1530  TempSrc:   PainSc: 0-No pain                 Lucretia Kern

## 2019-12-25 NOTE — Evaluation (Signed)
Physical Therapy Evaluation Patient Details Name: Kim Ponce MRN: [id number] DOB: 03-09-1951 Today's Date: 12/25/2019   History of Present Illness  Patient is 69 y.o. female s/p Lt THA anterior approach on 12/25/19 with PMH significant for OA, RA, anemia, Lt TKA, Lt RCR, Lt radial fracture wiht ORIF, lumbar discectomy, Lt CTR.    Clinical Impression  Kim Ponce is a 69 y.o. female POD 0 s/p Lt THA. Patient reports independence with mobility at baseline. Patient is now limited by functional impairments (see PT problem list below) and requires min assist/guard for transfers and gait with RW. Patient was able to ambulate ~80 feet with RW and min guard/assist. Patient instructed in exercise to facilitate ROM and circulation. Patient will benefit from continued skilled PT interventions to address impairments and progress towards PLOF. Acute PT will follow to progress mobility and stair training in preparation for safe discharge home.     Follow Up Recommendations Follow surgeon's recommendation for DC plan and follow-up therapies;Outpatient PT    Equipment Recommendations  Rolling walker with 5" wheels    Recommendations for Other Services       Precautions / Restrictions Precautions Precautions: Fall Restrictions Weight Bearing Restrictions: No Other Position/Activity Restrictions: WBAT      Mobility  Bed Mobility Overal bed mobility: Needs Assistance Bed Mobility: Supine to Sit     Supine to sit: Supervision;HOB elevated     General bed mobility comments: pt requires some extra time and use of bed rail.  Transfers Overall transfer level: Needs assistance Equipment used: Rolling walker (2 wheeled) Transfers: Sit to/from Stand Sit to Stand: Min assist         General transfer comment: cues for technique with RW and light assist for power up. no overt LOB noted.   Ambulation/Gait Ambulation/Gait assistance: Min assist;Min guard Gait Distance (Feet): 70  Feet Assistive device: Rolling walker (2 wheeled) Gait Pattern/deviations: Step-to pattern;Decreased step length - right;Decreased step length - left;Decreased stride length Gait velocity: decreased   General Gait Details: cues for safe step pattern with RW and safe proximity. assist to manage walker initially and pt progressed to min guard. no overt LOB noted.  Stairs         Wheelchair Mobility    Modified Rankin (Stroke Patients Only)       Balance Overall balance assessment: Needs assistance Sitting-balance support: Feet supported Sitting balance-Leahy Scale: Good     Standing balance support: During functional activity;Bilateral upper extremity supported Standing balance-Leahy Scale: Fair                Pertinent Vitals/Pain Pain Assessment: 0-10 Pain Score: 3  Pain Location: Lt hip Pain Descriptors / Indicators: Aching;Burning Pain Intervention(s): Limited activity within patient's tolerance;Monitored during session;Ice applied;Repositioned;Premedicated before session    Home Living Family/patient expects to be discharged to:: Private residence Living Arrangements: Alone Available Help at Discharge: Friend(s) Type of Home: Apartment Home Access: Level entry     Home Layout: One level Home Equipment: Cane - single point      Prior Function Level of Independence: Independent         Comments: pt enjoys dancing     Hand Dominance   Dominant Hand: Right    Extremity/Trunk Assessment   Upper Extremity Assessment Upper Extremity Assessment: Overall WFL for tasks assessed    Lower Extremity Assessment Lower Extremity Assessment: Overall WFL for tasks assessed    Cervical / Trunk Assessment Cervical / Trunk Assessment: Normal  Communication   Communication:  No difficulties  Cognition Arousal/Alertness: Awake/alert Behavior During Therapy: WFL for tasks assessed/performed Overall Cognitive Status: Within Functional Limits for tasks  assessed                 General Comments      Exercises Total Joint Exercises Ankle Circles/Pumps: AROM;20 reps;Both;Seated Quad Sets: AROM;Left;5 reps;Seated Heel Slides: AROM;Left;5 reps;Seated   Assessment/Plan    PT Assessment Patient needs continued PT services  PT Problem List Decreased strength;Decreased range of motion;Decreased activity tolerance;Decreased balance;Decreased mobility;Decreased knowledge of use of DME       PT Treatment Interventions DME instruction;Gait training;Stair training;Functional mobility training;Therapeutic activities;Therapeutic exercise;Balance training;Patient/family education    PT Goals (Current goals can be found in the Care Plan section)  Acute Rehab PT Goals Patient Stated Goal: to go clubing! PT Goal Formulation: With patient Time For Goal Achievement: 01/01/20 Potential to Achieve Goals: Good    Frequency 7X/week    AM-PAC PT "6 Clicks" Mobility  Outcome Measure Help needed turning from your back to your side while in a flat bed without using bedrails?: None Help needed moving from lying on your back to sitting on the side of a flat bed without using bedrails?: A Little Help needed moving to and from a bed to a chair (including a wheelchair)?: A Little Help needed standing up from a chair using your arms (e.g., wheelchair or bedside chair)?: A Little Help needed to walk in hospital room?: A Little Help needed climbing 3-5 steps with a railing? : A Little 6 Click Score: 19    End of Session Equipment Utilized During Treatment: Gait belt Activity Tolerance: Patient tolerated treatment well Patient left: in chair;with call bell/phone within reach;with chair alarm set Nurse Communication: Mobility status PT Visit Diagnosis: Muscle weakness (generalized) (M62.81);Difficulty in walking, not elsewhere classified (R26.2)    Time: 5885-0277 PT Time Calculation (min) (ACUTE ONLY): 24 min   Charges:   PT Evaluation $PT Eval  Low Complexity: 1 Low PT Treatments $Gait Training: 8-22 mins        Verner Mould, DPT Physical Therapist with [facility] [phone]  12/25/2019 5:53 PM

## 2019-12-25 NOTE — Op Note (Signed)
PATIENT ID:      Kim Ponce  MRN:     [id number] DOB/AGE:    1950-09-30 / 69 y.o.  OPERATIVE REPORT   DATE OF PROCEDURE:  12/25/2019      PREOPERATIVE DIAGNOSIS:  LEFT HIP RHEUMATOID ARTHRITIS                                                         POSTOPERATIVE DIAGNOSIS:  Same                                                         PROCEDURE: Anterior L total hip arthroplasty using a 52 mm DePuy Pinnacle  Cup, [employer], 0-degree polyethylene liner, a +1.5 mm x 41mm ceramic head, a 6hi Depuy Actis stem  SURGEON: Nestor Lewandowsky  ASSISTANT:   Tomi Likens. [employer]  (present throughout entire procedure and necessary for timely completion of the procedure)   ANESTHESIA: Spinal, Exparel 133mg  injection BLOOD LOSS: 300 cc FLUID REPLACEMENT: 1600 cc crystalloid TRANEXAMIC ACID: 1gm IV, 2gm Topical COMPLICATIONS: none    INDICATIONS FOR PROCEDURE: A 69 y.o. year-old With  LEFT HIP RHEUMATOID ARTHRITIS   for 3 years, x-rays show bone-on-bone arthritic changes, and osteophytes. Despite conservative measures with observation, anti-inflammatory medicine, narcotics, use of a cane, has severe unremitting pain and can ambulate only a few blocks before resting. Patient desires elective L total hip arthroplasty to decrease pain and increase function. The risks, benefits, and alternatives were discussed at length including but not limited to the risks of infection, bleeding, nerve injury, stiffness, blood clots, the need for revision surgery, cardiopulmonary complications, among others, and they were willing to proceed. Questions answered      PROCEDURE IN DETAIL: The patient was identified by armband,   received preoperative IV antibiotics in the holding area at [facility], taken to the operating room , appropriate anesthetic monitors   were attached and anesthesia was induced with the patient on the gurney. HANA boots were applied to the feet, and the patient  was transferred to  the HANA table with a peroneal post and support underneath the non-operative leg. Theoperative lower extremity was then prepped and draped in the usual sterile fashion from just above the iliac crest to the knee. And a timeout procedure was performed. [facility]. Tomi Likens [facility] was present and scrubbed throughout the case, critical for assistance with, positioning, exposure, retraction, instrumentation, and closure.Skin along incision area was injected with 10 cc of Exparel solution. We then made a 12 cm incision along the interval at the leading edge of the tensor fascia lata of starting at 2 cm lateral to the ASIS. Small bleeders in the skin and subcutaneous tissue identified and cauterized we dissected down to the fascia and made an incision in the fascia allowing [facility] to elevate the fascia of the tensor muscle and exploited the interval between the rectus and the tensor fascia lata. A Cobra retractor was then placed along the superior neck of the femur. A cerebellar retractor was used to expose the interval between the tensor fascia lata and the rectus femoris.  We  identified and cauterized the ascending branch of the anterior circumflex artery. A second Cobra retractor along the inferior neck of the femur. A small Hohmann retractor was placed underneath the origin of the rectus femoris, giving [place] good medial exposure. Using Ronguers fatty tissue was removed from in front of the anterior capsule. The capsule was then incised, starting out at the superior anterior rim of the acetabulum going laterally along the anterior neck. The capsule was then teed along the neck superiorly and inferiorly. Electrocautery was used to release capsule from the anterior and medial neck of the femur to allow external rotation. Cobra retractors were then placed along the inferior and superior neck allowing [place] to perform a standard neck cut and removed the femoral head with a power corkscrew. We then placed a medium bent homan retractor in  the cotyloid notch and posteriorly along the acetabular rim a narrow Cobra retractor. Exposed labral tissue and osteophytes were then removed. We then sequentially reamed up to a 51 mm basket reamer obtaining good coverage in all quadrants, verified by C-arm imaging. Under C-arm control we then hammered into place a 52 mm Pinnacle cup in 45 of abduction and 15 of anteversion. The cup seated nicely and required no supplemental screws. We then placed a central hole Eliminator and a 0 polyethylene liner. The foot was then externally rotated to 130-140. The limb was extended and adducted to the floor, delivering the proximal femur up into the wound. A medium curved Hohmann retractor was placed over the greater trochanter and a long Homan retractor along the posterior femoral neck completing the exposure and lateralizing the femur. We then performed releases superiorly and and inferiorly of the capsule going back to the pirformis fossa superiorly and to the lesser trochanter inferiorly. We then entered the proximal femur with the box cutting offset chisel followed by, a canal sounder, the chili pepper and broaching up to a 6hi broach. This seated nicely and we reamed the calcar. A trial reduction was performed with a 1.5 mm X 36 mm head.The limb lengths were excellent the hip was stable in 90 of external rotation. At this point the trial components removed and we hammered into place a # 6 hi  Offset Actis stem with Gryption coating. A + 1.5 mm x 36 ceramic head was then hammered into place. The hip was reduced and final C-arm images obtained. The wound was thoroughly irrigated with normal saline solution. We repaired the ant capsule and the tensor fascia lot a with running 0 vicryl suture. the subcutaneous tissue was closed with 2-0 and 3-0 Vicryl suture followed by an Aquacil dressing. At this point the patient was awaken and transferred to hospital gurney without difficulty.   Kerin Salen 12/25/2019, 11:28  AM

## 2019-12-25 NOTE — Plan of Care (Signed)
  Problem: Nutrition: Goal: Adequate nutrition will be maintained Outcome: Progressing   Problem: Pain Managment: Goal: General experience of comfort will improve Outcome: Progressing   

## 2019-12-25 NOTE — Anesthesia Preprocedure Evaluation (Addendum)
Anesthesia Evaluation  Patient identified by MRN, date of birth, ID band Patient awake    Reviewed: Allergy & Precautions, NPO status , Patient's Chart, lab work & pertinent test results  History of Anesthesia Complications Negative for: history of anesthetic complications  Airway Mallampati: II  TM Distance: >3 FB Neck ROM: Full    Dental   Pulmonary neg pulmonary ROS,    Pulmonary exam normal        Cardiovascular negative cardio ROS Normal cardiovascular exam     Neuro/Psych negative neurological ROS  negative psych ROS   GI/Hepatic negative GI ROS, Neg liver ROS,   Endo/Other  negative endocrine ROS  Renal/GU negative Renal ROS  negative genitourinary   Musculoskeletal  (+) Arthritis , Osteoarthritis and Rheumatoid disorders,    Abdominal   Peds  Hematology  (+) anemia ,   Anesthesia Other Findings   Reproductive/Obstetrics                            Anesthesia Physical Anesthesia Plan  ASA: II  Anesthesia Plan: Spinal   Post-op Pain Management:    Induction:   PONV Risk Score and Plan: 2 and Ondansetron, Midazolam, Treatment may vary due to age or medical condition and Dexamethasone  Airway Management Planned: Simple Face Mask  Additional Equipment: None  Intra-op Plan:   Post-operative Plan:   Informed Consent: I have reviewed the patients History and Physical, chart, labs and discussed the procedure including the risks, benefits and alternatives for the proposed anesthesia with the patient or authorized representative who has indicated his/her understanding and acceptance.     Dental advisory given  Plan Discussed with:   Anesthesia Plan Comments:        Anesthesia Quick Evaluation

## 2019-12-25 NOTE — Plan of Care (Signed)
Plan of care 

## 2019-12-25 NOTE — Transfer of Care (Signed)
Immediate Anesthesia Transfer of Care Note  Patient: Kim Ponce  Procedure(s) Performed: LEFT TOTAL HIP ARTHROPLASTY ANTERIOR APPROACH (Left Hip)  Patient Location: PACU  Anesthesia Type:Spinal  Level of Consciousness: awake, alert  and oriented  Airway & Oxygen Therapy: Patient Spontanous Breathing  Post-op Assessment: Report given to RN and Post -op Vital signs reviewed and stable  Post vital signs: Reviewed and stable  Last Vitals:  Vitals Value Taken Time  BP    Temp    Pulse    Resp    SpO2      Last Pain:  Vitals:   12/25/19 1053  TempSrc: Oral  PainSc:          Complications: No apparent anesthesia complications

## 2019-12-25 NOTE — Interval H&P Note (Signed)
History and Physical Interval Note:  12/25/2019 11:28 AM  Kim Ponce  has presented today for surgery, with the diagnosis of LEFT HIP RHEUMATOID ARTHRITIS.  The various methods of treatment have been discussed with the patient and family. After consideration of risks, benefits and other options for treatment, the patient has consented to  Procedure(s): LEFT TOTAL HIP ARTHROPLASTY ANTERIOR APPROACH (Left) as a surgical intervention.  The patient's history has been reviewed, patient examined, no change in status, stable for surgery.  I have reviewed the patient's chart and labs.  Questions were answered to the patient's satisfaction.     Nestor Lewandowsky

## 2019-12-25 NOTE — Anesthesia Procedure Notes (Signed)
Date/Time: 12/25/2019 1:06 PM Performed by: Jhonnie Garner, CRNA Oxygen Delivery Method: Simple face mask

## 2019-12-25 NOTE — Discharge Instructions (Signed)

## 2019-12-25 NOTE — Anesthesia Procedure Notes (Signed)
Spinal  Patient location during procedure: OR Start time: 12/25/2019 12:58 PM End time: 12/25/2019 1:03 PM Staffing Performed: resident/CRNA  Anesthesiologist: Lucretia Kern, MD Resident/CRNA: Jhonnie Garner, CRNA Preanesthetic Checklist Completed: patient identified, IV checked, risks and benefits discussed, surgical consent, monitors and equipment checked, pre-op evaluation and timeout performed Spinal Block Patient position: sitting Prep: DuraPrep Patient monitoring: heart rate, cardiac monitor, continuous pulse ox and blood pressure Approach: midline Location: L3-4 Injection technique: single-shot Needle Needle type: Pencan  Needle gauge: 24 G Needle length: 9 cm Assessment Sensory level: T4 Additional Notes Clear CSF, no paresthesia, patient tolerated well.

## 2019-12-26 ENCOUNTER — Encounter: Payer: Self-pay | Admitting: *Deleted

## 2019-12-26 DIAGNOSIS — M069 Rheumatoid arthritis, unspecified: Secondary | ICD-10-CM | POA: Diagnosis not present

## 2019-12-26 LAB — CBC
HCT: 28.6 % — ABNORMAL LOW (ref 36.0–46.0)
Hemoglobin: 8.8 g/dL — ABNORMAL LOW (ref 12.0–15.0)
MCH: 34 pg (ref 26.0–34.0)
MCHC: 30.8 g/dL (ref 30.0–36.0)
MCV: 110.4 fL — ABNORMAL HIGH (ref 80.0–100.0)
Platelets: 199 10*3/uL (ref 150–400)
RBC: 2.59 MIL/uL — ABNORMAL LOW (ref 3.87–5.11)
RDW: 14.3 % (ref 11.5–15.5)
WBC: 8.2 10*3/uL (ref 4.0–10.5)
nRBC: 0 % (ref 0.0–0.2)

## 2019-12-26 LAB — BASIC METABOLIC PANEL
Anion gap: 3 — ABNORMAL LOW (ref 5–15)
BUN: 13 mg/dL (ref 8–23)
CO2: 25 mmol/L (ref 22–32)
Calcium: 8.5 mg/dL — ABNORMAL LOW (ref 8.9–10.3)
Chloride: 109 mmol/L (ref 98–111)
Creatinine, Ser: 0.64 mg/dL (ref 0.44–1.00)
GFR calc Af Amer: >60 mL/min (ref >60)
GFR calc non Af Amer: >60 mL/min (ref >60)
Glucose, Bld: 158 mg/dL — ABNORMAL HIGH (ref 70–99)
Potassium: 4.6 mmol/L (ref 3.5–5.1)
Sodium: 137 mmol/L (ref 135–145)

## 2019-12-26 NOTE — Progress Notes (Signed)
PATIENT ID: Kim Ponce  MRN: [id number]  DOB/AGE:  [birth year] / 68 y.o.  1 Day Post-Op Procedure(s) (LRB): LEFT TOTAL HIP ARTHROPLASTY ANTERIOR APPROACH (Left)    PROGRESS NOTE Subjective: Patient is alert, oriented, no Nausea, no Vomiting, yes passing gas, . Taking PO well. Denies SOB, Chest or Calf Pain. Using Incentive Spirometer, PAS in place. Ambulate 41' Patient reports pain as  2/10  .    Objective: Vital signs in last 24 hours: Vitals:   12/25/19 1915 12/25/19 2346 12/26/19 0211 12/26/19 0527  BP: 113/64 98/67 107/62 115/77  Pulse: 82 61 (Abnormal) 50 65  Resp: 14 15 14 15   Temp: 97.8 F (36.6 C) 97.9 F (36.6 C) 97.8 F (36.6 C) 97.9 F (36.6 C)  TempSrc:      SpO2: 100% 99% 100% 100%  Weight:      Height:          Intake/Output from previous day: I/O last 3 completed shifts: In: 3063.2 [P.O.:240; I.V.:2723.2; IV Piggyback:100] Out: 3300 [Urine:3050; Blood:250]   Intake/Output this shift: No intake/output data recorded.   LABORATORY DATA: Recent Labs    12/26/19 0321  WBC 8.2  HGB 8.8*  HCT 28.6*  PLT 199  NA 137  K 4.6  CL 109  CO2 25  BUN 13  CREATININE 0.64  GLUCOSE 158*  CALCIUM 8.5*    Examination: Neurologically intact ABD soft Neurovascular intact Sensation intact distally Intact pulses distally Dorsiflexion/Plantar flexion intact Incision: dressing C/D/I No cellulitis present Compartment soft} XR AP&Lat of hip shows well placed\fixed THA  Assessment:   1 Day Post-Op Procedure(s) (LRB): LEFT TOTAL HIP ARTHROPLASTY ANTERIOR APPROACH (Left) ADDITIONAL DIAGNOSIS:  Expected Acute Blood Loss Anemia, RA  Patient's anticipated LOS is less than 2 midnights, meeting these requirements: - Younger than 82 - Lives within 1 hour of care - Has a competent adult at home to recover with post-op recover - NO history of  - Chronic pain requiring opiods  - Diabetes  - Coronary Artery Disease  - Heart failure  - Heart attack  -  Stroke  - DVT/VTE  - Cardiac arrhythmia  - Respiratory Failure/COPD  - Renal failure  - Anemia  - Advanced Liver disease       Plan: PT/OT WBAT, THA  DVT Prophylaxis: SCDx72 hrs, ASA 81 mg BID x 2 weeks  DISCHARGE PLAN: Home  DISCHARGE NEEDS: HHPT, Walker and 3-in-1 comode seatPatient ID: [id number], female   DOB: [birth year], 69 y.o.   [name]

## 2019-12-26 NOTE — TOC Progression Note (Signed)
Transition of Care [facility]) - Progression Note    Patient Details  Name: Kim Ponce MRN: [id number] Date of Birth: 1951-03-16  [facility]) CM/SW Contact  Clearance Coots, LCSW Phone Number: 12/26/2019, 11:32 AM  Clinical Narrative:    CSW discussed discharge plan with the patient. Patient reports she is scheduled for OPPT on May 6th.  [employer] delivered RW to bedside. Patient has a 3 in 1.  No other needs identified.      Barriers to Discharge: Barriers Resolved  Expected Discharge Plan and Services           Expected Discharge Date: 12/26/19               DME Arranged: Dan Humphreys rolling(Has 3 in 1) DME Agency: [employer] Date DME Agency Contacted: 12/26/19 Time DME Agency Contacted: 1131 Representative spoke with at DME Agency: Harrold Donath HH Arranged: NA HH Agency: NA         Social Determinants of Health (SDOH) Interventions    Readmission Risk Interventions No flowsheet data found.

## 2019-12-26 NOTE — Discharge Summary (Signed)
Patient ID: Kim Ponce MRN: [id number] DOB/AGE: 1950-12-18 69 y.o.  Admit date: 12/25/2019 Discharge date: 12/26/2019  Admission Diagnoses:  Principal Problem:   Osteoarthritis of left hip Active Problems:   Status post total replacement of left hip   Discharge Diagnoses:  Same  Past Medical History:  Diagnosis Date  . Anemia   . Arthritis    RA and osteoarthritis  . Closed fracture of left distal radius   . Wears glasses     Surgeries: Procedure(s): LEFT TOTAL HIP ARTHROPLASTY ANTERIOR APPROACH on 12/25/2019   Consultants:   Discharged Condition: Improved  Hospital Course: Kim Ponce is an 69 y.o. female who was admitted 12/25/2019 for operative treatment ofOsteoarthritis of left hip. Patient has severe unremitting pain that affects sleep, daily activities, and work/hobbies. After pre-op clearance the patient was taken to the operating room on 12/25/2019 and underwent  Procedure(s): LEFT TOTAL HIP ARTHROPLASTY ANTERIOR APPROACH.    Patient was given perioperative antibiotics:  Anti-infectives (From admission, onward)   Start     Dose/Rate Route Frequency Ordered Stop   12/25/19 1030  vancomycin (VANCOCIN) IVPB 1000 mg/200 mL premix     1,000 mg 200 mL/hr over 60 Minutes Intravenous On call to O.R. 12/25/19 1025 12/25/19 1234       Patient was given sequential compression devices, early ambulation, and chemoprophylaxis to prevent DVT.  Patient benefited maximally from hospital stay and there were no complications.    Recent vital signs:  Patient Vitals for the past 24 hrs:  BP Temp Temp src Pulse Resp SpO2 Height Weight  12/26/19 0527 115/77 97.9 F (36.6 C) no documentation 65 15 100 % no documentation no documentation  12/26/19 0211 107/62 97.8 F (36.6 C) no documentation (Abnormal) 50 14 100 % no documentation no documentation  12/25/19 2346 98/67 97.9 F (36.6 C) no documentation 61 15 99 % no documentation no documentation  12/25/19 1915 113/64 97.8 F  (36.6 C) no documentation 82 14 100 % no documentation no documentation  12/25/19 1747 108/63 98 F (36.7 C) no documentation 80 14 100 % no documentation no documentation  12/25/19 1653 140/68 (Abnormal) 97.5 F (36.4 C) Axillary 61 12 100 % no documentation no documentation  12/25/19 1544 116/62 no documentation no documentation (Abnormal) 58 18 100 % 5\' 6"  (1.676 m) 59.9 kg  12/25/19 1530 126/63 no documentation no documentation (Abnormal) 58 10 100 % no documentation no documentation  12/25/19 1515 (Abnormal) 118/50 no documentation no documentation 67 (Abnormal) 23 100 % no documentation no documentation  12/25/19 1500 116/61 no documentation no documentation 61 17 100 % no documentation no documentation  12/25/19 1445 (Abnormal) 107/57 no documentation no documentation 63 11 100 % no documentation no documentation  12/25/19 1435 103/65 (Abnormal) 97.5 F (36.4 C) no documentation 72 12 100 % no documentation no documentation  12/25/19 1053 127/73 98.9 F (37.2 C) Oral 69 18 100 % no documentation no documentation  12/25/19 1050 no documentation no documentation no documentation no documentation no documentation no documentation 5\' 6"  (1.676 m) 59.9 kg     Recent laboratory studies:  Recent Labs    12/26/19 0321  WBC 8.2  HGB 8.8*  HCT 28.6*  PLT 199  NA 137  K 4.6  CL 109  CO2 25  BUN 13  CREATININE 0.64  GLUCOSE 158*  CALCIUM 8.5*     Discharge Medications:   Allergies as of 12/26/2019    Allergen Reactions Comment   Morphine And Related Hives  Penicillins Hives    Sulfa Antibiotics Other (See Comments) REACTION:  unknown   Codeine Rash       Medication List    Stop taking these medications   HYDROcodone-acetaminophen 5-325 MG tablet Commonly known as: Norco   ibuprofen 200 MG tablet Commonly known as: ADVIL     Take these medications   aspirin EC 81 MG tablet Take 1 tablet (81 mg total) by mouth 2 (two) times daily.   celecoxib 200 MG  capsule Commonly known as: CELEBREX Take 200 mg by mouth 2 (two) times daily.   ENBREL Page Inject into the skin once a week.   methotrexate 2.5 MG tablet Take 20 mg by mouth once a week.   oxyCODONE-acetaminophen 5-325 MG tablet Commonly known as: PERCOCET/ROXICET Take 1 tablet by mouth every 4 (four) hours as needed for severe pain.   tiZANidine 2 MG tablet Commonly known as: ZANAFLEX Take 1 tablet (2 mg total) by mouth every 6 (six) hours as needed.        Durable Medical Equipment  (From admission, onward)         Start     Ordered   12/25/19 1605  DME Walker rolling  Once    Question:  Patient needs a walker to treat with the following condition  Answer:  Status post total hip replacement, left   12/25/19 1604   12/25/19 1605  DME 3 n 1  Once     12/25/19 1604           Discharge Care Instructions  (From admission, onward)         Start     Ordered   12/26/19 0000  Change dressing    Comments: Change dressing Only if drainage exceeds 40% of window on dressing   12/26/19 0814   12/25/19 0000  Weight bearing as tolerated     12/25/19 1438          Diagnostic Studies: DG Chest 2 View  Result Date: 12/21/2019 CLINICAL DATA:  Preoperative assessment for hip surgery EXAM: CHEST - 2 VIEW COMPARISON:  September 21, 2012 FINDINGS: The lungs are clear. The heart size and pulmonary vascularity are normal. No adenopathy. No bone lesions. IMPRESSION: Lungs clear.  Cardiac silhouette within normal limits. Electronically Signed   By: Bretta Bang III M.D.   On: 12/21/2019 15:17   DG C-Arm 1-60 Min-No Report  Result Date: 12/25/2019 CLINICAL DATA:  Left hip arthroplasty EXAM: DG HIP (WITH OR WITHOUT PELVIS) 1V*L*; DG C-ARM 1-60 MIN-NO REPORT COMPARISON:  None. FINDINGS: 2 C-arm fluoroscopic images were obtained intraoperatively and submitted for post operative interpretation. Two fluoroscopic images demonstrate left total hip arthroplasty hardware without apparent  complication. 9 seconds of fluoroscopy time was utilized. Please see the performing provider's procedural report for further detail. IMPRESSION: As above. Electronically Signed   By: Duanne Guess D.O.   On: 12/25/2019 14:17   DG HIP UNILAT WITH PELVIS 1V LEFT  Result Date: 12/25/2019 CLINICAL DATA:  Left hip arthroplasty EXAM: DG HIP (WITH OR WITHOUT PELVIS) 1V*L*; DG C-ARM 1-60 MIN-NO REPORT COMPARISON:  None. FINDINGS: 2 C-arm fluoroscopic images were obtained intraoperatively and submitted for post operative interpretation. Two fluoroscopic images demonstrate left total hip arthroplasty hardware without apparent complication. 9 seconds of fluoroscopy time was utilized. Please see the performing provider's procedural report for further detail. IMPRESSION: As above. Electronically Signed   By: Duanne Guess D.O.   On: 12/25/2019 14:17    Disposition: Discharge disposition:  01-Home or Self Care       Discharge Instructions    Call MD / Call 911   Complete by: As directed    If you experience chest pain or shortness of breath, CALL 911 and be transported to the hospital emergency room.  If you develope a fever above 101 F, pus (white drainage) or increased drainage or redness at the wound, or calf pain, call your surgeon's office.   Call MD / Call 911   Complete by: As directed    If you experience chest pain or shortness of breath, CALL 911 and be transported to the hospital emergency room.  If you develope a fever above 101 F, pus (white drainage) or increased drainage or redness at the wound, or calf pain, call your surgeon's office.   Change dressing   Complete by: As directed    Change dressing Only if drainage exceeds 40% of window on dressing   Constipation Prevention   Complete by: As directed    Drink plenty of fluids.  Prune juice may be helpful.  You may use a stool softener, such as Colace (over the counter) 100 mg twice a day.  Use MiraLax (over the counter) for  constipation as needed.   Constipation Prevention   Complete by: As directed    Drink plenty of fluids.  Prune juice may be helpful.  You may use a stool softener, such as Colace (over the counter) 100 mg twice a day.  Use MiraLax (over the counter) for constipation as needed.   Diet - low sodium heart healthy   Complete by: As directed    Diet - low sodium heart healthy   Complete by: As directed    Driving restrictions   Complete by: As directed    No driving for 2 weeks   Increase activity slowly as tolerated   Complete by: As directed    Increase activity slowly as tolerated   Complete by: As directed    Patient may shower   Complete by: As directed    You may shower without a dressing once there is no drainage.  Do not wash over the wound.  If drainage remains, cover wound with plastic wrap and then shower.   Weight bearing as tolerated   Complete by: As directed       Follow-up Information    [name], MD In 2 weeks.   Specialty: Orthopedic Surgery Contact information: [address] [place] [address] [phone]            Signed: Nestor Lewandowsky 12/26/2019, 8:15 AM

## 2019-12-26 NOTE — Progress Notes (Signed)
Physical Therapy Treatment Patient Details Name: Kim Ponce MRN: [id number] DOB: 05-14-1951 Today's Date: 12/26/2019    History of Present Illness Patient is 69 y.o. female s/p Lt THA anterior approach on 12/25/19 with PMH significant for OA, RA, anemia, Lt TKA, Lt RCR, Lt radial fracture wiht ORIF, lumbar discectomy, Lt CTR.    PT Comments    Progressing well with mobility. Pt was able to dress herself with supervision assist. Encouraged pt to ambulate often at home. Pt stated she is to begin OP PT on Thursday. All education completed. Okay to d/c from PT standpoint.    Follow Up Recommendations  Follow surgeon's recommendation for DC plan and follow-up therapies     Equipment Recommendations  Rolling walker with 5" wheels    Recommendations for Other Services       Precautions / Restrictions Precautions Precautions: Fall Restrictions Weight Bearing Restrictions: No Other Position/Activity Restrictions: WBAT    Mobility  Bed Mobility Overal bed mobility: Needs Assistance Bed Mobility: Supine to Sit     Supine to sit: Supervision;HOB elevated        Transfers Overall transfer level: Needs assistance Equipment used: Rolling walker (2 wheeled) Transfers: Sit to/from Stand Sit to Stand: Supervision         General transfer comment: VCs safety, hand placement.  Ambulation/Gait Ambulation/Gait assistance: Min guard Gait Distance (Feet): 150 Feet Assistive device: Rolling walker (2 wheeled) Gait Pattern/deviations: Step-to pattern;Step-through pattern;Decreased stride length;Decreased step length - right     General Gait Details: Cues for pt to begin using reciprocal gait pattern and to increase step lengths, especially on R. Min guard for safety. No LOB with RW use.   Stairs             Wheelchair Mobility    Modified Rankin (Stroke Patients Only)       Balance Overall balance assessment: Mild deficits observed, not formally tested                                           Cognition Arousal/Alertness: Awake/alert Behavior During Therapy: WFL for tasks assessed/performed Overall Cognitive Status: Within Functional Limits for tasks assessed                                        Exercises Total Joint Exercises Ankle Circles/Pumps: AROM;Both;10 reps;Supine Quad Sets: AROM;Both;10 reps;Supine Heel Slides: AROM;Left;10 reps;Supine Hip ABduction/ADduction: AROM;Left;10 reps;Supine    General Comments        Pertinent Vitals/Pain Pain Assessment: 0-10 Pain Score: 4  Pain Location: L hip/thigh Pain Descriptors / Indicators: Discomfort;Sore Pain Intervention(s): Monitored during session;Repositioned    Home Living                      Prior Function            PT Goals (current goals can now be found in the care plan section) Progress towards PT goals: Progressing toward goals    Frequency    7X/week      PT Plan Current plan remains appropriate    Co-evaluation              AM-PAC PT "6 Clicks" Mobility   Outcome Measure  Help needed turning from your back to your side while in a  flat bed without using bedrails?: None Help needed moving from lying on your back to sitting on the side of a flat bed without using bedrails?: A Little Help needed moving to and from a bed to a chair (including a wheelchair)?: A Little Help needed standing up from a chair using your arms (e.g., wheelchair or bedside chair)?: A Little Help needed to walk in hospital room?: A Little Help needed climbing 3-5 steps with a railing? : A Little 6 Click Score: 19    End of Session Equipment Utilized During Treatment: Gait belt Activity Tolerance: Patient tolerated treatment well Patient left: in chair;with call bell/phone within reach   PT Visit Diagnosis: Other abnormalities of gait and mobility (R26.89)     Time: 8719-5974 PT Time Calculation (min) (ACUTE ONLY): 16  min  Charges:  $Gait Training: 8-22 mins                         Faye Ramsay, PT Acute Rehabilitation

## 2020-09-05 ENCOUNTER — Other Ambulatory Visit: Payer: Self-pay

## 2020-09-05 ENCOUNTER — Emergency Department (HOSPITAL_BASED_OUTPATIENT_CLINIC_OR_DEPARTMENT_OTHER): Payer: Self-pay

## 2020-09-05 ENCOUNTER — Emergency Department (HOSPITAL_BASED_OUTPATIENT_CLINIC_OR_DEPARTMENT_OTHER)
Admission: EM | Admit: 2020-09-05 | Discharge: 2020-09-05 | Disposition: A | Discharge status: Home / Self Care | Payer: Self-pay | Attending: Emergency Medicine | Admitting: Emergency Medicine

## 2020-09-05 ENCOUNTER — Encounter (HOSPITAL_BASED_OUTPATIENT_CLINIC_OR_DEPARTMENT_OTHER): Payer: Self-pay

## 2020-09-05 DIAGNOSIS — R21 Rash and other nonspecific skin eruption: Secondary | ICD-10-CM | POA: Insufficient documentation

## 2020-09-05 DIAGNOSIS — Z7982 Long term (current) use of aspirin: Secondary | ICD-10-CM | POA: Insufficient documentation

## 2020-09-05 DIAGNOSIS — Z96642 Presence of left artificial hip joint: Secondary | ICD-10-CM | POA: Insufficient documentation

## 2020-09-05 DIAGNOSIS — Z96652 Presence of left artificial knee joint: Secondary | ICD-10-CM | POA: Insufficient documentation

## 2020-09-05 DIAGNOSIS — N390 Urinary tract infection, site not specified: Secondary | ICD-10-CM | POA: Insufficient documentation

## 2020-09-05 LAB — URINALYSIS, MICROSCOPIC (REFLEX)
RBC / HPF: >50 RBC/hpf (ref 0–5)
WBC, UA: >50 WBC/hpf (ref 0–5)

## 2020-09-05 LAB — URINALYSIS, ROUTINE W REFLEX MICROSCOPIC
Bilirubin Urine: NEGATIVE
Glucose, UA: NEGATIVE mg/dL
Ketones, ur: NEGATIVE mg/dL
Nitrite: POSITIVE — AB
Protein, ur: 100 mg/dL — AB
Specific Gravity, Urine: 1.03 (ref 1.005–1.030)
pH: 6 (ref 5.0–8.0)

## 2020-09-05 MED ORDER — NITROFURANTOIN MONOHYD MACRO 100 MG PO CAPS: 100.0000 mg | ORAL_CAPSULE | Freq: Two times a day (BID) | ORAL | 0 refills | Status: DC

## 2020-09-05 NOTE — ED Provider Notes (Signed)
[facility] EMERGENCY DEPARTMENT Provider Note   CSN: [id number] Arrival date & time: 09/05/20  1155     History Chief Complaint  Patient presents with  . Multiple c/o    Kim Ponce is a 70 y.o. female with a past medical history of anemia, RA currently on methotrexate presenting to the ED with multiple complaints. Reports UTI symptoms for the past 2 days.  Reports dysuria.  Has not tried medications to help with her symptoms.  She is concerned that she has a UTI but denies any history of frequent UTIs. Also reports swelling to bilateral lips as well as rash throughout her back.  She cannot recall any incident that may have triggered the swelling or the rash.  She does admit that throughout November and December, she stopped taking her methotrexate due to stressors related to her social life.  She started taking the methotrexate about 5 days ago and 2 days after starting it, stopped taking it again as she developed this rash and swelling.  States that she did not have similar symptoms when she has been on methotrexate in the past.   Has been having some dyspnea on exertion.   Reports decreased p.o. intake for the past 2 days but cannot tell me exactly why.  No vomiting or diarrhea.  No back pain, abdominal pain, fever.  HPI     Past Medical History:  Diagnosis Date  . Anemia   . Arthritis    RA and osteoarthritis  . Closed fracture of left distal radius   . Wears glasses     Patient Active Problem List   Diagnosis Date Noted  . 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 Osteoarthritis of left hip 12/22/2019  . Osteoarthritis of left knee 09/28/2012    Past Surgical History:  Procedure Laterality Date  . ABDOMINAL HYSTERECTOMY    . BACK SURGERY     lumbar disectomy  . CARPAL TUNNEL RELEASE Left 11/13/2013   Procedure: LEFT CARPAL TUNNEL RELEASE;  Surgeon: Nestor Lewandowsky, MD;  Location: [facility];  Service: Orthopedics;   Laterality: Left;  . JOINT REPLACEMENT    . OPEN REDUCTION INTERNAL FIXATION (ORIF) DISTAL RADIAL FRACTURE Left 04/07/2018   Procedure: OPEN REDUCTION INTERNAL FIXATION (ORIF) DISTAL RADIAL FRACTURE;  Surgeon: Betha Loa, MD;  Location: [facility];  Service: Orthopedics;  Laterality: Left;  . SHOULDER ARTHROSCOPY WITH ROTATOR CUFF REPAIR Left 11/13/2013   Procedure: LEFT SHOULDER ARTHROSCOPY WITH DISTAL CLAVICLE ACROMIOPLASTY, LABRAL AND PARTIAL ROTATOR CUFF TEAR DEBRIDEMENT, SUBACROMIAL DECOMPRESSION;  Surgeon: Nestor Lewandowsky, MD;  Location: [facility];  Service: Orthopedics;  Laterality: Left;  . TOTAL HIP ARTHROPLASTY Left 12/25/2019   Procedure: LEFT TOTAL HIP ARTHROPLASTY ANTERIOR APPROACH;  Surgeon: [name], MD;  Location: [facility];  Service: Orthopedics;  Laterality: Left;  . TOTAL KNEE ARTHROPLASTY  09/26/2012   LEFT KNEE           Dr Turner Daniels   . TOTAL KNEE ARTHROPLASTY  09/26/2012   Procedure: TOTAL KNEE ARTHROPLASTY;  Surgeon: Nestor Lewandowsky, MD;  Location: [facility];  Service: Orthopedics;  Laterality: Left;     OB History   No obstetric history on file.     No family history on file.  Social History   Tobacco Use  . Smoking status: Never Smoker  . Smokeless tobacco: Never Used  Vaping Use  . Vaping Use: Never used  Substance Use Topics  . Alcohol  use: Yes    Comment: occassional  . Drug use: No    Home Medications Prior to Admission medications   Medication Sig Start Date End Date Taking? Authorizing Provider  nitrofurantoin, macrocrystal-monohydrate, (MACROBID) 100 MG capsule Take 1 capsule (100 mg total) by mouth 2 (two) times daily. 09/05/20  Yes Justun Anaya, PA-C  aspirin EC 81 MG tablet Take 1 tablet (81 mg total) by mouth 2 (two) times daily. 12/25/19   Allena Katz, PA-C  celecoxib (CELEBREX) 200 MG capsule Take 200 mg by mouth 2 (two) times daily.    [provider]  Etanercept (ENBREL [place]) Inject into the skin once a week.     [provider]  methotrexate 2.5 MG tablet Take 20 mg by mouth once a week.     [provider]  oxyCODONE-acetaminophen (PERCOCET/ROXICET) 5-325 MG tablet Take 1 tablet by mouth every 4 (four) hours as needed for severe pain. 12/25/19   Allena Katz, PA-C  tiZANidine (ZANAFLEX) 2 MG tablet Take 1 tablet (2 mg total) by mouth every 6 (six) hours as needed. 12/25/19   Allena Katz, PA-C    Allergies    Morphine and related, Penicillins, Sulfa antibiotics, and Codeine  Review of Systems   Review of Systems  Constitutional: Negative for appetite change, chills and fever.  HENT: Positive for facial swelling. Negative for ear pain, rhinorrhea, sneezing and sore throat.   Eyes: Negative for photophobia and visual disturbance.  Respiratory: Positive for shortness of breath. Negative for cough, chest tightness and wheezing.   Cardiovascular: Negative for chest pain and palpitations.  Gastrointestinal: Negative for abdominal pain, blood in stool, constipation, diarrhea, nausea and vomiting.  Genitourinary: Positive for dysuria and frequency. Negative for hematuria and urgency.  Musculoskeletal: Negative for myalgias.  Skin: Positive for rash.  Neurological: Negative for dizziness, weakness and light-headedness.    Physical Exam Updated Vital Signs BP 129/71 (BP Location: Right Arm)   Pulse 74   Temp 98.5 F (36.9 C) (Oral)   Resp (!) 25   Ht 5\' 6"  (1.676 m)   Wt 50.8 kg   SpO2 100%   BMI 18.08 kg/m   Physical Exam Vitals and nursing note reviewed.  Constitutional:      General: She is not in acute distress.    Appearance: She is well-developed and well-nourished.     Comments: Speaking complete sentences without difficulty.  HENT:     Head: Normocephalic and atraumatic.     Nose: Nose normal.     Mouth/Throat:     Comments: Sores noted on bilateral lips without signs of angioedema or anaphylaxis. Eyes:     General: No scleral icterus.       Right eye: No  discharge.        Left eye: No discharge.     Extraocular Movements: EOM normal.     Conjunctiva/sclera: Conjunctivae normal.  Cardiovascular:     Rate and Rhythm: Normal rate and regular rhythm.     Pulses: Intact distal pulses.     Heart sounds: Normal heart sounds. No murmur heard. No friction rub. No gallop.   Pulmonary:     Effort: Pulmonary effort is normal. No respiratory distress.     Breath sounds: Normal breath sounds.  Abdominal:     General: Bowel sounds are normal. There is no distension.     Palpations: Abdomen is soft.     Tenderness: There is no abdominal tenderness. There is no guarding.  Musculoskeletal:  General: No edema. Normal range of motion.     Cervical back: Normal range of motion and neck supple.  Skin:    General: Skin is warm and dry.     Findings: Rash (on back and mouth) present.  Neurological:     Mental Status: She is alert.     Motor: No abnormal muscle tone.     Coordination: Coordination normal.  Psychiatric:        Mood and Affect: Mood and affect normal.         ED Results / Procedures / Treatments   Labs (all labs ordered are listed, but only abnormal results are displayed) Labs Reviewed  URINALYSIS, ROUTINE W REFLEX MICROSCOPIC - Abnormal; Notable for the following components:      Result Value   APPearance CLOUDY (*)    Hgb urine dipstick LARGE (*)    Protein, ur 100 (*)    Nitrite POSITIVE (*)    Leukocytes,Ua MODERATE (*)    All other components within normal limits  URINALYSIS, MICROSCOPIC (REFLEX) - Abnormal; Notable for the following components:   Bacteria, UA MANY (*)    All other components within normal limits  URINE CULTURE    EKG EKG Interpretation  Date/Time:  Thursday September 05 2020 16:14:16 EST Ventricular Rate:  72 PR Interval:    QRS Duration: 91 QT Interval:  399 QTC Calculation: 437 R Axis:   36 Text Interpretation: Sinus rhythm Abnormal R-wave progression, early transition Confirmed by  Cherlynn Perches ([id number]) on 09/05/2020 7:39:24 PM   Radiology DG Chest Portable 1 View  Result Date: 09/05/2020 CLINICAL DATA:  Shortness of breath. EXAM: PORTABLE CHEST 1 VIEW COMPARISON:  December 21, 2019. FINDINGS: The heart size and mediastinal contours are within normal limits. Both lungs are clear. No visible pleural effusions or pneumothorax. No acute osseous abnormality. IMPRESSION: No active disease. Electronically Signed   By: Feliberto Harts MD   On: 09/05/2020 16:29    Procedures Procedures (including critical care time)  Medications Ordered in ED Medications - No data to display  ED Course  I have reviewed the triage vital signs and the nursing notes.  Pertinent labs & imaging results that were available during my care of the patient were reviewed by me and considered in my medical decision making (see chart for details).    MDM Rules/Calculators/A&P                          70 year old female with past medical history of RA on methotrexate, Celebrex and hydrocodone presenting to the ED with multiple complaints.  She is concerned that she has a UTI as she has been having urinary frequency and dysuria.  On exam her abdomen is soft and she has no CVA tenderness bilaterally.  She is afebrile.  Urinalysis with positive nitrites, leukocytes, bacteria and pyuria.  We will treat with Macrobid as she has a penicillin allergy and documentation shows that she has not been on cephalosporins in the past.  She is also complaining of a rash which she noticed 2 days ago after restarting her methotrexate after being off of it for several months.  There is an erythematous, papular rash noted on the back as well as sores in her mouth.  There are no signs of angioedema or anaphylaxis.  She complains of shortness of breath but her lungs are clear, and she is not tachypneic or tachycardic.  Checks x-ray without any acute findings. EKG with  normal sinus rhythm, no STEMI. She denies chest pain, she does  report increased stressors which could be causing her shortness of breath as she is overall well-appearing. Pt has a patent airway without stridor and is handling secretions without difficulty; no angioedema. No blisters, no pustules, no warmth, no draining sinus tracts, no superficial abscesses, no bullous impetigo, no vesicles, no desquamation, no target lesions with dusky purpura or a central bulla. Not tender to touch. No concern for superimposed infection. No concern for SJS, TEN, TSS, tick borne illness, syphilis or other life-threatening condition.  Suspected that her rash could be a reaction to her methotrexate.  We will have her follow-up with her rheumatologist and stop the methotrexate until she follows up with them as this may worsen her symptoms.  Return precautions given.   Patient is hemodynamically stable, in NAD, and able to ambulate in the ED. Evaluation does not show pathology that would require ongoing emergent intervention or inpatient treatment. I explained the diagnosis to the patient. Pain has been managed and has no complaints prior to discharge. Patient is comfortable with above plan and is stable for discharge at this time. All questions were answered prior to disposition. Strict return precautions for returning to the ED were discussed. Encouraged follow up with PCP.   An After Visit Summary was printed and given to the patient.   Portions of this note were generated with Scientist, clinical (histocompatibility and immunogenetics). Dictation errors may occur despite best attempts at proofreading.   Final Clinical Impression(s) / ED Diagnoses Final diagnoses:  Lower urinary tract infectious disease  Rash and nonspecific skin eruption    Rx / DC Orders ED Discharge Orders         Ordered    nitrofurantoin, macrocrystal-monohydrate, (MACROBID) 100 MG capsule  2 times daily        09/05/20 1935           Dietrich Pates, [place] 09/05/20 1950    Sabino Donovan, MD 09/09/20 [phone]

## 2020-09-05 NOTE — ED Notes (Signed)
Lab informed that Urine Culture test has been requested

## 2020-09-05 NOTE — ED Triage Notes (Signed)
Pt c/o dysuria, swelling to vaginal area and rectal area-also c/o swelling to lips-pt with sors to bottom lip-NAD-to triage in w/c

## 2020-09-05 NOTE — Discharge Instructions (Addendum)
Your rash may have been caused by your medication, methotrexate. Stop taking this medication until you follow-up with your rheumatologist to figure out why you are having the rash. Your urine did show signs of infection.  I have sent it for cultures if need to be on different antibiotic based on the bacteria that is growing, we will call you and let you know. Return to the ER if you start to experience worsening rash, lip or tongue swelling, trouble breathing, fever or chest pain.

## 2020-09-05 NOTE — ED Notes (Signed)
States she feels she has a UTI, onset 3 days, having urgency and frequency and having dysuria. She noted her urine was dark in color and had an odor as well. Denies any fever, nausea, vomiting or having diarrhea.

## 2020-09-08 LAB — URINE CULTURE: Culture: >=100000 — AB

## 2020-09-09 ENCOUNTER — Encounter (HOSPITAL_BASED_OUTPATIENT_CLINIC_OR_DEPARTMENT_OTHER): Payer: Self-pay | Admitting: *Deleted

## 2020-09-09 ENCOUNTER — Telehealth: Payer: Self-pay | Admitting: Hematology and Oncology

## 2020-09-09 ENCOUNTER — Other Ambulatory Visit: Payer: Self-pay

## 2020-09-09 ENCOUNTER — Emergency Department (HOSPITAL_BASED_OUTPATIENT_CLINIC_OR_DEPARTMENT_OTHER): Payer: Self-pay

## 2020-09-09 ENCOUNTER — Inpatient Hospital Stay (HOSPITAL_BASED_OUTPATIENT_CLINIC_OR_DEPARTMENT_OTHER)
Admission: EM | Admit: 2020-09-09 | Discharge: 2020-09-18 | DRG: 918 | Disposition: A | Discharge status: Home / Self Care | Payer: Self-pay | Attending: Family Medicine | Admitting: Family Medicine

## 2020-09-09 DIAGNOSIS — K121 Other forms of stomatitis: Secondary | ICD-10-CM | POA: Diagnosis present

## 2020-09-09 DIAGNOSIS — N898 Other specified noninflammatory disorders of vagina: Secondary | ICD-10-CM | POA: Diagnosis present

## 2020-09-09 DIAGNOSIS — Z20822 Contact with and (suspected) exposure to covid-19: Secondary | ICD-10-CM | POA: Diagnosis present

## 2020-09-09 DIAGNOSIS — R9431 Abnormal electrocardiogram [ECG] [EKG]: Secondary | ICD-10-CM | POA: Diagnosis present

## 2020-09-09 DIAGNOSIS — T451X1A Poisoning by antineoplastic and immunosuppressive drugs, accidental (unintentional), initial encounter: Principal | ICD-10-CM | POA: Diagnosis present

## 2020-09-09 DIAGNOSIS — N289 Disorder of kidney and ureter, unspecified: Secondary | ICD-10-CM

## 2020-09-09 DIAGNOSIS — D709 Neutropenia, unspecified: Secondary | ICD-10-CM | POA: Diagnosis present

## 2020-09-09 DIAGNOSIS — M069 Rheumatoid arthritis, unspecified: Secondary | ICD-10-CM | POA: Diagnosis present

## 2020-09-09 DIAGNOSIS — B951 Streptococcus, group B, as the cause of diseases classified elsewhere: Secondary | ICD-10-CM | POA: Diagnosis present

## 2020-09-09 DIAGNOSIS — T451X5A Adverse effect of antineoplastic and immunosuppressive drugs, initial encounter: Secondary | ICD-10-CM | POA: Diagnosis present

## 2020-09-09 DIAGNOSIS — F064 Anxiety disorder due to known physiological condition: Secondary | ICD-10-CM | POA: Diagnosis present

## 2020-09-09 DIAGNOSIS — R17 Unspecified jaundice: Secondary | ICD-10-CM | POA: Diagnosis present

## 2020-09-09 DIAGNOSIS — K1230 Oral mucositis (ulcerative), unspecified: Secondary | ICD-10-CM

## 2020-09-09 DIAGNOSIS — E440 Moderate protein-calorie malnutrition: Secondary | ICD-10-CM | POA: Diagnosis present

## 2020-09-09 DIAGNOSIS — B9629 Other Escherichia coli [E. coli] as the cause of diseases classified elsewhere: Secondary | ICD-10-CM

## 2020-09-09 DIAGNOSIS — E872 Acidosis: Secondary | ICD-10-CM | POA: Diagnosis present

## 2020-09-09 DIAGNOSIS — Z882 Allergy status to sulfonamides status: Secondary | ICD-10-CM

## 2020-09-09 DIAGNOSIS — Z791 Long term (current) use of non-steroidal anti-inflammatories (NSAID): Secondary | ICD-10-CM

## 2020-09-09 DIAGNOSIS — R040 Epistaxis: Secondary | ICD-10-CM | POA: Diagnosis not present

## 2020-09-09 DIAGNOSIS — E860 Dehydration: Secondary | ICD-10-CM | POA: Diagnosis present

## 2020-09-09 DIAGNOSIS — Z79899 Other long term (current) drug therapy: Secondary | ICD-10-CM

## 2020-09-09 DIAGNOSIS — K1231 Oral mucositis (ulcerative) due to antineoplastic therapy: Secondary | ICD-10-CM | POA: Diagnosis present

## 2020-09-09 DIAGNOSIS — N390 Urinary tract infection, site not specified: Secondary | ICD-10-CM | POA: Diagnosis present

## 2020-09-09 DIAGNOSIS — Z681 Body mass index (BMI) 19 or less, adult: Secondary | ICD-10-CM

## 2020-09-09 DIAGNOSIS — E870 Hyperosmolality and hypernatremia: Secondary | ICD-10-CM | POA: Diagnosis not present

## 2020-09-09 DIAGNOSIS — Z880 Allergy status to penicillin: Secondary | ICD-10-CM

## 2020-09-09 DIAGNOSIS — R5081 Fever presenting with conditions classified elsewhere: Secondary | ICD-10-CM | POA: Diagnosis present

## 2020-09-09 DIAGNOSIS — E876 Hypokalemia: Secondary | ICD-10-CM | POA: Diagnosis present

## 2020-09-09 DIAGNOSIS — Z96652 Presence of left artificial knee joint: Secondary | ICD-10-CM | POA: Diagnosis present

## 2020-09-09 DIAGNOSIS — Z7982 Long term (current) use of aspirin: Secondary | ICD-10-CM

## 2020-09-09 DIAGNOSIS — K209 Esophagitis, unspecified without bleeding: Secondary | ICD-10-CM | POA: Diagnosis present

## 2020-09-09 DIAGNOSIS — Z885 Allergy status to narcotic agent status: Secondary | ICD-10-CM

## 2020-09-09 DIAGNOSIS — R627 Adult failure to thrive: Secondary | ICD-10-CM | POA: Diagnosis present

## 2020-09-09 DIAGNOSIS — N179 Acute kidney failure, unspecified: Secondary | ICD-10-CM | POA: Diagnosis present

## 2020-09-09 DIAGNOSIS — Z96642 Presence of left artificial hip joint: Secondary | ICD-10-CM | POA: Diagnosis present

## 2020-09-09 DIAGNOSIS — Z1612 Extended spectrum beta lactamase (ESBL) resistance: Secondary | ICD-10-CM | POA: Diagnosis present

## 2020-09-09 DIAGNOSIS — D509 Iron deficiency anemia, unspecified: Secondary | ICD-10-CM | POA: Diagnosis present

## 2020-09-09 DIAGNOSIS — D61818 Other pancytopenia: Secondary | ICD-10-CM | POA: Diagnosis present

## 2020-09-09 DIAGNOSIS — L270 Generalized skin eruption due to drugs and medicaments taken internally: Secondary | ICD-10-CM | POA: Diagnosis present

## 2020-09-09 DIAGNOSIS — E8809 Other disorders of plasma-protein metabolism, not elsewhere classified: Secondary | ICD-10-CM | POA: Diagnosis present

## 2020-09-09 LAB — CBC WITH DIFFERENTIAL/PLATELET
Abs Immature Granulocytes: 0 10*3/uL (ref 0.00–0.07)
Basophils Absolute: 0 10*3/uL (ref 0.0–0.1)
Basophils Relative: 0 %
Eosinophils Absolute: 0 10*3/uL (ref 0.0–0.5)
Eosinophils Relative: 6 %
HCT: 31.2 % — ABNORMAL LOW (ref 36.0–46.0)
Hemoglobin: 10.2 g/dL — ABNORMAL LOW (ref 12.0–15.0)
Immature Granulocytes: 0 %
Lymphocytes Relative: 89 %
Lymphs Abs: 0.2 10*3/uL — ABNORMAL LOW (ref 0.7–4.0)
MCH: 29.3 pg (ref 26.0–34.0)
MCHC: 32.7 g/dL (ref 30.0–36.0)
MCV: 89.7 fL (ref 80.0–100.0)
Monocytes Absolute: 0 10*3/uL — ABNORMAL LOW (ref 0.1–1.0)
Monocytes Relative: 5 %
Neutro Abs: 0 10*3/uL — CL (ref 1.7–7.7)
Neutrophils Relative %: 0 %
Platelets: 49 10*3/uL — ABNORMAL LOW (ref 150–400)
RBC: 3.48 MIL/uL — ABNORMAL LOW (ref 3.87–5.11)
RDW: 14.1 % (ref 11.5–15.5)
Smear Review: NORMAL
WBC: <0.1 10*3/uL — CL (ref 4.0–10.5)
nRBC: 0 % (ref 0.0–0.2)

## 2020-09-09 LAB — RESP PANEL BY RT-PCR (FLU A&B, COVID) ARPGX2
Influenza A by PCR: NEGATIVE
Influenza B by PCR: NEGATIVE
SARS Coronavirus 2 by RT PCR: NEGATIVE

## 2020-09-09 LAB — COMPREHENSIVE METABOLIC PANEL
ALT: 14 U/L (ref 0–44)
AST: 22 U/L (ref 15–41)
Albumin: 3 g/dL — ABNORMAL LOW (ref 3.5–5.0)
Alkaline Phosphatase: 214 U/L — ABNORMAL HIGH (ref 38–126)
Anion gap: 19 — ABNORMAL HIGH (ref 5–15)
BUN: 46 mg/dL — ABNORMAL HIGH (ref 8–23)
CO2: 15 mmol/L — ABNORMAL LOW (ref 22–32)
Calcium: 9.5 mg/dL (ref 8.9–10.3)
Chloride: 101 mmol/L (ref 98–111)
Creatinine, Ser: 1.19 mg/dL — ABNORMAL HIGH (ref 0.44–1.00)
GFR, Estimated: 50 mL/min — ABNORMAL LOW (ref >60)
Glucose, Bld: 136 mg/dL — ABNORMAL HIGH (ref 70–99)
Potassium: 2.7 mmol/L — CL (ref 3.5–5.1)
Sodium: 135 mmol/L (ref 135–145)
Total Bilirubin: 6.9 mg/dL — ABNORMAL HIGH (ref 0.3–1.2)
Total Protein: 8.4 g/dL — ABNORMAL HIGH (ref 6.5–8.1)

## 2020-09-09 LAB — URINALYSIS, ROUTINE W REFLEX MICROSCOPIC
Bilirubin Urine: NEGATIVE
Glucose, UA: NEGATIVE mg/dL
Ketones, ur: NEGATIVE mg/dL
Leukocytes,Ua: NEGATIVE
Nitrite: NEGATIVE
Protein, ur: 30 mg/dL — AB
Specific Gravity, Urine: 1.015 (ref 1.005–1.030)
pH: 7 (ref 5.0–8.0)

## 2020-09-09 LAB — URINALYSIS, MICROSCOPIC (REFLEX): WBC, UA: NONE SEEN WBC/hpf (ref 0–5)

## 2020-09-09 LAB — HIV ANTIBODY (ROUTINE TESTING W REFLEX): HIV Screen 4th Generation wRfx: NONREACTIVE

## 2020-09-09 LAB — LACTIC ACID, PLASMA
Lactic Acid, Venous: 2.6 mmol/L (ref 0.5–1.9)
Lactic Acid, Venous: 2.2 mmol/L (ref 0.5–1.9)

## 2020-09-09 MED ORDER — LACTATED RINGERS IV BOLUS (SEPSIS)
250.0000 mL | Freq: Once | INTRAVENOUS | Status: AC
  Administered 2020-09-09: 250 mL via INTRAVENOUS

## 2020-09-09 MED ORDER — ACETAMINOPHEN 160 MG/5ML PO SOLN
650.0000 mg | Freq: Once | ORAL | Status: AC
  Administered 2020-09-09: 650 mg via ORAL
  Filled 2020-09-09: qty 20.3

## 2020-09-09 MED ORDER — VANCOMYCIN HCL 10 G IV SOLR
20.0000 mg/kg | Freq: Once | INTRAVENOUS | Status: DC
  Filled 2020-09-09: qty 1016

## 2020-09-09 MED ORDER — ORAL CARE MOUTH RINSE
15.0000 mL | Freq: Two times a day (BID) | OROMUCOSAL | Status: DC
  Administered 2020-09-10 – 2020-09-18 (×10): 15 mL via OROMUCOSAL

## 2020-09-09 MED ORDER — LACTATED RINGERS IV BOLUS (SEPSIS)
500.0000 mL | Freq: Once | INTRAVENOUS | Status: AC
  Administered 2020-09-09: 500 mL via INTRAVENOUS

## 2020-09-09 MED ORDER — POTASSIUM CHLORIDE 10 MEQ/100ML IV SOLN
10.0000 meq | INTRAVENOUS | Status: AC
  Administered 2020-09-09 (×6): 10 meq via INTRAVENOUS
  Filled 2020-09-09 (×5): qty 100

## 2020-09-09 MED ORDER — VANCOMYCIN HCL IN DEXTROSE 1-5 GM/200ML-% IV SOLN
1000.0000 mg | Freq: Once | INTRAVENOUS | Status: AC
  Administered 2020-09-09: 1000 mg via INTRAVENOUS
  Filled 2020-09-09: qty 200

## 2020-09-09 MED ORDER — LACTATED RINGERS IV BOLUS (SEPSIS)
1000.0000 mL | Freq: Once | INTRAVENOUS | Status: AC
  Administered 2020-09-09: 1000 mL via INTRAVENOUS

## 2020-09-09 MED ORDER — CHLORHEXIDINE GLUCONATE 0.12 % MT SOLN
15.0000 mL | Freq: Two times a day (BID) | OROMUCOSAL | Status: DC
  Administered 2020-09-10 – 2020-09-18 (×15): 15 mL via OROMUCOSAL
  Filled 2020-09-09 (×14): qty 15

## 2020-09-09 MED ORDER — SODIUM CHLORIDE 0.9 % IV BOLUS
1000.0000 mL | Freq: Once | INTRAVENOUS | Status: AC
  Administered 2020-09-09: 1000 mL via INTRAVENOUS

## 2020-09-09 MED ORDER — SODIUM CHLORIDE 0.9 % IV SOLN: 1.0000 g | Freq: Three times a day (TID) | INTRAVENOUS | Status: DC

## 2020-09-09 MED ORDER — SODIUM CHLORIDE 0.9 % IV SOLN
1.0000 g | Freq: Three times a day (TID) | INTRAVENOUS | Status: DC
  Administered 2020-09-09 – 2020-09-10 (×3): 1 g via INTRAVENOUS
  Filled 2020-09-09 (×2): qty 1

## 2020-09-09 MED ORDER — ENSURE ENLIVE PO LIQD
237.0000 mL | Freq: Two times a day (BID) | ORAL | Status: DC
  Administered 2020-09-10: 237 mL via ORAL

## 2020-09-09 NOTE — Consult Note (Addendum)
[place]  [phone] Fax:([phone]    [place]  Referring MD:  Dr. Marva Panda  Reason for Referral: Pancytopenia  HPI: Kim Ponce is a 70 year old female with a past medical history significant for rheumatoid and osteoarthritis and anemia.  The patient was seen in the emergency room due to fever.  She was previously seen in the ED for mouth sores, rash on her back, and UTI 09/05/2020.  It was thought that her rash was secondary to a reaction from methotrexate and she was advised to stop taking it.  The patient had been on methotrexate for her rheumatoid arthritis but stopped it for several months but recently started taking it again about 2 weeks ago.  She was given Macrobid for her UTI symptoms.  She presented to the emergency room again today due to worsening sores in her mouth and difficulty swallowing.  Reports improvement in her UTI symptoms.  She was noted to have a fever of 101.7 in the ER.  A CBC with differential was drawn in the emergency room which showed a WBC of less than 0.1, ANC was 0.0, absolute lymphocytes 0.2, and absolute monocyte 0.0, hemoglobin 10.2, hematocrit 31.2, and platelet count of 49,000.  Her most recent CBC available to me was before on 12/26/2019 and at that time her WBC was 8.2, hemoglobin was 8.8, and platelets 199,000.  Her BUN was 46, creatinine 1.19 (appears baseline is between 0.5-0.6), alk phos 214, T bili 6.9.  The patient had an abdominal ultrasound performed earlier this morning which was a normal right upper quadrant ultrasound and no explanation for elevated bilirubin.  The patient was transferred to [facility] from [facility] last evening.  The patient is having significant pain from oral mucositis this morning.  She also reports a small amount of bleeding from her mouth ulcers.  Denies epistaxis and hematuria.  She is unable to eat secondary to mucositis.  She has a rash on  her back and several sores on her vulva.  The patient tells me that she has taken methotrexate in the past and was off of it for several months.  She recently restarted methotrexate 20 mg/week dose.  The patient was uncertain as to the exact date that she started it and how many doses that she is taken.  However, pharmacy review indicates that the prescription was filled on 08/30/2020.  She was seen in the emergency room on 09/05/2020 and was advised to stop taking methotrexate at that time.  Therefore, I estimate that she took 1 dose of methotrexate on around 08/30/2020.  The patient did not notice any fevers or chills at home but was noted to be febrile in the emergency room.  She continues to have dysuria.  She denies headaches, dizziness, chest pain, shortness of breath.  Hematology was asked to see the patient to make recommendations regarding her pancytopenia.   Past Medical History:  Diagnosis Date   Anemia    Arthritis    RA and osteoarthritis   Closed fracture of left distal radius    Wears glasses   :    Past Surgical History:  Procedure Laterality Date   ABDOMINAL HYSTERECTOMY     BACK SURGERY     lumbar disectomy   CARPAL TUNNEL RELEASE Left 11/13/2013   Procedure: LEFT CARPAL TUNNEL RELEASE;  Surgeon: Kerin Salen, MD;  Location: [place];  Service: Orthopedics;  Laterality: Left;  JOINT REPLACEMENT     OPEN REDUCTION INTERNAL FIXATION (ORIF) DISTAL RADIAL FRACTURE Left 04/07/2018   Procedure: OPEN REDUCTION INTERNAL FIXATION (ORIF) DISTAL RADIAL FRACTURE;  Surgeon: Leanora Cover, MD;  Location: [place];  Service: Orthopedics;  Laterality: Left;   SHOULDER ARTHROSCOPY WITH ROTATOR CUFF REPAIR Left 11/13/2013   Procedure: LEFT SHOULDER ARTHROSCOPY WITH DISTAL CLAVICLE ACROMIOPLASTY, LABRAL AND PARTIAL ROTATOR CUFF TEAR DEBRIDEMENT, SUBACROMIAL DECOMPRESSION;  Surgeon: Kerin Salen, MD;  Location: [place];  Service: Orthopedics;   Laterality: Left;   TOTAL HIP ARTHROPLASTY Left 12/25/2019   Procedure: LEFT TOTAL HIP ARTHROPLASTY ANTERIOR APPROACH;  Surgeon: Frederik Pear, MD;  Location: [facility];  Service: Orthopedics;  Laterality: Left;   TOTAL KNEE ARTHROPLASTY  09/26/2012   LEFT KNEE           Dr Mayer Camel    TOTAL KNEE ARTHROPLASTY  09/26/2012   Procedure: TOTAL KNEE ARTHROPLASTY;  Surgeon: Kerin Salen, MD;  Location: [place];  Service: Orthopedics;  Laterality: Left;  :   CURRENT MEDS: Current Facility-Administered Medications  Medication Dose Route Frequency Provider Last Rate Last Admin   lactated ringers bolus 500 mL  500 mL Intravenous Once Truddie Hidden, MD       And   lactated ringers bolus 250 mL  250 mL Intravenous Once Truddie Hidden, MD       meropenem [facility]) 1 g in sodium chloride 0.9 % 100 mL IVPB  1 g Intravenous Q8H Truddie Hidden, MD   Stopped at 09/09/20 1543   potassium chloride 10 mEq in 100 mL IVPB  10 mEq Intravenous Q1 Hr x 6 Calvert Cantor B, MD 100 mL/hr at 09/09/20 1600 10 mEq at 09/09/20 1600   vancomycin (VANCOCIN) IVPB 1000 mg/200 mL premix  1,000 mg Intravenous Once Rumbarger, Valeda Malm, RPH       Current Outpatient Medications  Medication Sig Dispense Refill   aspirin EC 81 MG tablet Take 1 tablet (81 mg total) by mouth 2 (two) times daily. 60 tablet 0   celecoxib (CELEBREX) 200 MG capsule Take 200 mg by mouth 2 (two) times daily.     Etanercept (ENBREL [place]) Inject into the skin once a week.     methotrexate 2.5 MG tablet Take 20 mg by mouth once a week.      nitrofurantoin, macrocrystal-monohydrate, (MACROBID) 100 MG capsule Take 1 capsule (100 mg total) by mouth 2 (two) times daily. 10 capsule 0   oxyCODONE-acetaminophen (PERCOCET/ROXICET) 5-325 MG tablet Take 1 tablet by mouth every 4 (four) hours as needed for severe pain. 30 tablet 0   tiZANidine (ZANAFLEX) 2 MG tablet Take 1 tablet (2 mg total) by mouth every 6 (six) hours as needed. 60 tablet 0      Allergies   Allergen Reactions   Morphine And Related Hives   Penicillins Hives                        Sulfa Antibiotics Other (See Comments)    REACTION:  unknown   Codeine Rash   :  History reviewed. No pertinent family history.:  Social History   Socioeconomic History   Marital status: Divorced    Spouse name: Not on file   Number of children: Not on file   Years of education: Not on file   Highest education level: Not on file  Occupational History   Not on file  Tobacco Use   Smoking status:  Never Smoker   Smokeless tobacco: Never Used  Vaping Use   Vaping Use: Never used  Substance and Sexual Activity   Alcohol use: Yes    Comment: occassional   Drug use: No   Sexual activity: Not on file  Other Topics Concern   Not on file  Social History Narrative   Not on file   Social Determinants of Health   Financial Resource Strain: Not on file  Food Insecurity: Not on file  Transportation Needs: Not on file  Physical Activity: Not on file  Stress: Not on file  Social Connections: Not on file  Intimate Partner Violence: Not on file  :  REVIEW OF SYSTEMS:  A comprehensive 14 point review of systems was negative except as noted in the HPI.    Exam: Patient Vitals for the past 24 hrs:  BP Temp Temp src Pulse Resp SpO2 Height Weight  09/09/20 1604 (!) 144/87 -- -- (!) 108 16 100 % -- --  09/09/20 1453 106/74 99.8 F (37.7 C) Oral (!) 118 (!) 22 98 % -- --  09/09/20 1430 104/73 -- -- (!) 119 (!) 27 99 % -- --  09/09/20 1347 101/68 (!) 101.7 F (38.7 C) -- (!) 137 16 100 % -- --  09/09/20 1346 -- -- -- -- -- -- '5\' 6"'  (1.676 m) 50.8 kg  09/09/20 1344 -- -- -- -- -- 100 % -- --    General: Thin, frail female. Eyes:  no scleral icterus.   ENT: Oral mucosa and lips are erythematous and edematous.  Multiple oral lesions with dried blood.    Neck was without thyromegaly.   Lymphatics:  Negative cervical, supraclavicular or axillary adenopathy.   Respiratory:  lungs were clear bilaterally without wheezing or crackles.   Cardiovascular:  Regular rate and rhythm, S1/S2, without murmur, rub or gallop.  There was no pedal edema.   GI:  abdomen was soft, flat, nontender, nondistended, without organomegaly.   Musculoskeletal: Ulnar deviation of the fingers bilaterally Skin: Erythematous macules over the back Neuro exam was nonfocal.  Patient was alert and oriented.  Attention was good.   Language was appropriate.  Mood was normal without depression.  Speech was not pressured.  Thought content was not tangential.    LABS:  Lab Results  Component Value Date   WBC 0.1 (LL) 09/10/2020   HGB 7.9 (L) 09/10/2020   HCT 24.9 (L) 09/10/2020   PLT 22 (LL) 09/10/2020   GLUCOSE 102 (H) 09/10/2020   ALT 13 09/10/2020   AST 18 09/10/2020   NA 135 09/10/2020   K 2.9 (L) 09/10/2020   CL 107 09/10/2020   CREATININE 0.96 09/10/2020   BUN 40 (H) 09/10/2020   CO2 15 (L) 09/10/2020   INR 1.6 (H) 09/10/2020    DG Chest Port 1 View  Result Date: 09/09/2020 CLINICAL DATA:  Fever and tachycardia EXAM: PORTABLE CHEST 1 VIEW COMPARISON:  September 05, 2020 FINDINGS: The lungs are clear. The heart size and pulmonary vascularity are normal. No adenopathy. No bone lesions. IMPRESSION: Lungs clear.  Cardiac silhouette. Electronically Signed   By: Lowella Grip III M.D.   On: 09/09/2020 15:20   DG Chest Portable 1 View  Result Date: 09/05/2020 CLINICAL DATA:  Shortness of breath. EXAM: PORTABLE CHEST 1 VIEW COMPARISON:  December 21, 2019. FINDINGS: The heart size and mediastinal contours are within normal limits. Both lungs are clear. No visible pleural effusions or pneumothorax. No acute osseous abnormality. IMPRESSION: No active disease. Electronically  Signed   By: Margaretha Sheffield MD   On: 09/05/2020 16:29   Addendum: I have seen the patient, examined her and collaborated the history with my nurse practitioner I also spoke with nurse practitioner from the rheumatologist  office about her history The patient was noted to be a poor historian She has been on methotrexate for more than 10 years When I confronted the patient whether she is taking folic acid, she denies taking any folic acid although the nurse practitioner from the rheumatologist office has reinforced the importance of folic acid supplementation in the past I have also reviewed her peripheral blood smear which showed signs of pancytopenia, unremarkable white blood cells, absolute thrombocytopenia without any clumping or signs of schistocytes, consistent with probable bone marrow suppression from methotrexate toxicity   ASSESSMENT AND PLAN:  Pancytopenia with neutropenic fever Pancytopenia likely due to methotrexate toxicity No evidence to suggest an underlying bone marrow disorder -we do not plan to perform a bone marrow biopsy I have reviewed her hospitalization with the rheumatologist office and advised against resumption of methotrexate in the near future Folate level is significantly low and she will be started on folic acid 1 mg daily Recommend supportive care with transfusion support if hemoglobin is less than 7 or platelets are less than 10,000 Anticipate this will take several weeks for bone marrow recovery Follow CBC daily No need for G-CSF support unless she develops septic shock  Hyperbilirubinemia Unclear etiology, but most likely due to methotrexate toxicity Monitor  Urinary tract infection The patient has UTI symptoms Follow-up on urine culture Antibiotics per hospitalist; I recommend narrowing down the choice of antibiotics as soon as cultures are negative  Severe mucositis Due to methotrexate toxicity Continue supportive care with Magic mouthwash and pain medication  AKI Due to dehydration Creatinine is improving with IV fluids Continue IV fluids per hospitalist  Discharge planning She will likely be here for 3 to 5 days I recommend resolution of neutropenia prior to  discharge if possible Will follow  Thank you for this referral.  Mikey Bussing, DNP, AGPCNP-BC, AOCNP Heath Lark, MD

## 2020-09-09 NOTE — ED Notes (Signed)
Pt hand off to [employer], Charity fundraiser. Updated about pts IV meds and pt on the way with [facility]. [facility] to updated RN upon arrival to unit. Pt aaox3, ambulatory with assistance, VSS, GCS 15, pt is still drooling at this time.

## 2020-09-09 NOTE — ED Provider Notes (Signed)
[facility] EMERGENCY DEPARTMENT Provider Note  CSN: [id number] Arrival date & time: 09/09/20 1339    History Chief Complaint  Patient presents with  . Fever    HPI  Kim Ponce is a 70 y.o. female with history of rheumatoid arthritis has been taking Norco and Celebrex for a long time. Was previously on Methotrexate but was not taking that for several months until she started back on it about 2 weeks ago. She was seen in this ED for a mouth/back rash and UTI on 1/13. At that time her rash was attributed to a drug reaction to methotrexate and she was advised to stop taking it. She was also diagnosed with UTI and prescribed macrobid. She reports she has been taking the Abx but she has had worsening sores in her mouth and pain with swallowing. She feels like her UTI symptoms are improved. She was noted to be febrile and tachycardic on arrival. She denies any SOB, cough or URI symptoms. She has had Covid vaccine.    Past Medical History:  Diagnosis Date  . Anemia   . Arthritis    RA and osteoarthritis  . Closed fracture of left distal radius   . Wears glasses     Past Surgical History:  Procedure Laterality Date  . ABDOMINAL HYSTERECTOMY    . BACK SURGERY     lumbar disectomy  . CARPAL TUNNEL RELEASE Left 11/13/2013   Procedure: LEFT CARPAL TUNNEL RELEASE;  Surgeon: Nestor Lewandowsky, MD;  Location: [facility];  Service: Orthopedics;  Laterality: Left;  . JOINT REPLACEMENT    . OPEN REDUCTION INTERNAL FIXATION (ORIF) DISTAL RADIAL FRACTURE Left 04/07/2018   Procedure: OPEN REDUCTION INTERNAL FIXATION (ORIF) DISTAL RADIAL FRACTURE;  Surgeon: Betha Loa, MD;  Location: [facility];  Service: Orthopedics;  Laterality: Left;  . SHOULDER ARTHROSCOPY WITH ROTATOR CUFF REPAIR Left 11/13/2013   Procedure: LEFT SHOULDER ARTHROSCOPY WITH DISTAL CLAVICLE ACROMIOPLASTY, LABRAL AND PARTIAL ROTATOR CUFF TEAR DEBRIDEMENT, SUBACROMIAL DECOMPRESSION;   Surgeon: Nestor Lewandowsky, MD;  Location: [facility];  Service: Orthopedics;  Laterality: Left;  . TOTAL HIP ARTHROPLASTY Left 12/25/2019   Procedure: LEFT TOTAL HIP ARTHROPLASTY ANTERIOR APPROACH;  Surgeon: [name], MD;  Location: [facility];  Service: Orthopedics;  Laterality: Left;  . TOTAL KNEE ARTHROPLASTY  09/26/2012   LEFT KNEE           Dr Turner Daniels   . TOTAL KNEE ARTHROPLASTY  09/26/2012   Procedure: TOTAL KNEE ARTHROPLASTY;  Surgeon: Nestor Lewandowsky, MD;  Location: [facility];  Service: Orthopedics;  Laterality: Left;    History reviewed. No pertinent family history.  Social History   Tobacco Use  . Smoking status: Never Smoker  . Smokeless tobacco: Never Used  Vaping Use  . Vaping Use: Never used  Substance Use Topics  . Alcohol use: Yes    Comment: occassional  . Drug use: No     Home Medications Prior to Admission medications   Medication Sig Start Date End Date Taking? Authorizing Provider  aspirin EC 81 MG tablet Take 1 tablet (81 mg total) by mouth 2 (two) times daily. 12/25/19   Allena Katz, PA-C  celecoxib (CELEBREX) 200 MG capsule Take 200 mg by mouth 2 (two) times daily.    [provider]  Etanercept (ENBREL [place]) Inject into the skin once a week.    [provider]  methotrexate 2.5 MG tablet Take 20 mg by mouth once a  week.     [provider]  nitrofurantoin, macrocrystal-monohydrate, (MACROBID) 100 MG capsule Take 1 capsule (100 mg total) by mouth 2 (two) times daily. 09/05/20   [name], [name], PA-C  oxyCODONE-acetaminophen (PERCOCET/ROXICET) 5-325 MG tablet Take 1 tablet by mouth every 4 (four) hours as needed for severe pain. 12/25/19   Allena Katz, PA-C  tiZANidine (ZANAFLEX) 2 MG tablet Take 1 tablet (2 mg total) by mouth every 6 (six) hours as needed. 12/25/19   Allena Katz, PA-C     Allergies    Morphine and related, Penicillins, Sulfa antibiotics, and Codeine   Review of Systems   Review of Systems A  comprehensive review of systems was completed and negative except as noted in HPI.    Physical Exam BP (!) 145/95   Pulse (!) 105   Temp 98.8 F (37.1 C) (Oral)   Resp (!) 22   Ht 5\' 6"  (1.676 m)   Wt 50.8 kg   SpO2 100%   BMI 18.08 kg/m   Physical Exam Vitals and nursing note reviewed.  Constitutional:      Appearance: Normal appearance.  HENT:     Head: Normocephalic and atraumatic.     Nose: Nose normal.     Mouth/Throat:     Mouth: Mucous membranes are dry.     Comments: Sores on lips worsened from previous, she has a beefy red tongue and ulcers on posterior pharynx  Eyes:     Extraocular Movements: Extraocular movements intact.     Conjunctiva/sclera: Conjunctivae normal.  Cardiovascular:     Rate and Rhythm: Tachycardia present.  Pulmonary:     Effort: Pulmonary effort is normal.     Breath sounds: Normal breath sounds.  Abdominal:     General: Abdomen is flat.     Palpations: Abdomen is soft.     Tenderness: There is no abdominal tenderness.  Musculoskeletal:        General: No swelling. Normal range of motion.     Cervical back: Neck supple.  Skin:    General: Skin is warm and dry.     Findings: Rash (maculopapular erythematous rash to back with central purpurua) present.  Neurological:     General: No focal deficit present.     Mental Status: She is alert.  Psychiatric:        Mood and Affect: Mood normal.      ED Results / Procedures / Treatments   Labs (all labs ordered are listed, but only abnormal results are displayed) Labs Reviewed  CBC WITH DIFFERENTIAL/PLATELET - Abnormal; Notable for the following components:      Result Value   WBC <0.1 (*)    RBC 3.48 (*)    Hemoglobin 10.2 (*)    HCT 31.2 (*)    Platelets 49 (*)    Neutro Abs 0.0 (*)    Lymphs Abs 0.2 (*)    Monocytes Absolute 0.0 (*)    All other components within normal limits  COMPREHENSIVE METABOLIC PANEL - Abnormal; Notable for the following components:   Potassium 2.7 (*)     CO2 15 (*)    Glucose, Bld 136 (*)    BUN 46 (*)    Creatinine, Ser 1.19 (*)    Total Protein 8.4 (*)    Albumin 3.0 (*)    Alkaline Phosphatase 214 (*)    Total Bilirubin 6.9 (*)    GFR, Estimated 50 (*)    Anion gap 19 (*)    All  other components within normal limits  LACTIC ACID, PLASMA - Abnormal; Notable for the following components:   Lactic Acid, Venous 2.6 (*)    All other components within normal limits  LACTIC ACID, PLASMA - Abnormal; Notable for the following components:   Lactic Acid, Venous 2.2 (*)    All other components within normal limits  URINALYSIS, ROUTINE W REFLEX MICROSCOPIC - Abnormal; Notable for the following components:   Color, Urine BROWN (*)    APPearance CLOUDY (*)    Hgb urine dipstick LARGE (*)    Protein, ur 30 (*)    All other components within normal limits  URINALYSIS, MICROSCOPIC (REFLEX) - Abnormal; Notable for the following components:   Bacteria, UA RARE (*)    All other components within normal limits  RESP PANEL BY RT-PCR (FLU A&B, COVID) ARPGX2  URINE CULTURE  CULTURE, BLOOD (ROUTINE X 2)  CULTURE, BLOOD (ROUTINE X 2)  PATHOLOGIST SMEAR REVIEW  EPSTEIN-BARR VIRUS VCA, IGG  EPSTEIN-BARR VIRUS VCA, IGM  RSV(RESPIRATORY SYNCYTIAL VIRUS) AB, BLOOD  CMV IGM  HIV ANTIBODY (ROUTINE TESTING W REFLEX)  B. BURGDORFI ANTIBODIES  PARVOVIRUS B19 ANTIBODY, IGG AND IGM    EKG EKG Interpretation  Date/Time:  Monday September 09 2020 14:25:48 EST Ventricular Rate:  121 PR Interval:    QRS Duration: 82 QT Interval:  367 QTC Calculation: 521 R Axis:   10 Text Interpretation: Sinus tachycardia Borderline ST depression, anterolateral leads Prolonged QT interval Since last tracing Rate faster Confirmed by Susy Frizzle [phone]) on 09/09/2020 3:27:54 PM    Radiology DG Chest Port 1 View  Result Date: 09/09/2020 CLINICAL DATA:  Fever and tachycardia EXAM: PORTABLE CHEST 1 VIEW COMPARISON:  September 05, 2020 FINDINGS: The lungs are clear.  The heart size and pulmonary vascularity are normal. No adenopathy. No bone lesions. IMPRESSION: Lungs clear.  Cardiac silhouette. Electronically Signed   By: Bretta Bang III M.D.   On: 09/09/2020 15:20    Procedures Procedures  Medications Ordered in the ED Medications  meropenem (MERREM) 1 g in sodium chloride 0.9 % 100 mL IVPB (0 g Intravenous Stopped 09/09/20 1543)  potassium chloride 10 mEq in 100 mL IVPB (10 mEq Intravenous New Bag/Given 09/09/20 1816)  acetaminophen (TYLENOL) 160 MG/5ML solution 650 mg (650 mg Oral Given 09/09/20 1401)  sodium chloride 0.9 % bolus 1,000 mL (0 mLs Intravenous Stopped 09/09/20 1543)  lactated ringers bolus 1,000 mL (0 mLs Intravenous Stopped 09/09/20 1625)    And  lactated ringers bolus 500 mL (0 mLs Intravenous Stopped 09/09/20 1729)    And  lactated ringers bolus 250 mL (0 mLs Intravenous Stopped 09/09/20 1702)  vancomycin (VANCOCIN) IVPB 1000 mg/200 mL premix (0 mg Intravenous Stopped 09/09/20 1729)     MDM Rules/Calculators/A&P MDM Patient's previous ED visit, including photos, reviewed. Rash in mouth has definitely worsened. She also had ESBL grow from her urine. Given fever, tachycardia will treat with Merrem to cover ESBL.  ED Course  I have reviewed the triage vital signs and the nursing notes.  Pertinent labs & imaging results that were available during my care of the patient were reviewed by me and considered in my medical decision making (see chart for details).  Clinical Course as of 09/09/20 1949  Mon Sep 09, 2020  1528 Labs show CBC with hypokalemia and worsening renal function from previous baseline. Will begin IV repletion. Her Lactic acid is mildly elevated, 30cc/kg LR bolus ordered.  [CS]  1529 CBC shows essentially no WBC, with neutropenia.  She has no history of same. Mild anemia is similar to baseline. Per Lab, WBC morphology is normal, consider myelosuppression.  [CS]  1531 RN asked to collect blood cultures prior starting  Abx. Spoke with Pharmacy, will give one dose of Vanc as well given neutropenia of unclear etiology.  [CS]  1543 Covid/Flu are negative.  [CS]  1549 Spoke with Dr. Dairl Ponder, Hospitalist, who will accept for admission to [facility]. Currently no beds available. He also requests we discuss with Hem/Onc.  [CS]  1604 Spoke with Dr. Bertis Ruddy, Hem/Onc who will consult when patient arrives at [facility].  [CS]  1731 Lactic acid improving.  [CS]    Clinical Course User Index [CS] Pollyann Savoy, MD    Final Clinical Impression(s) / ED Diagnoses Final diagnoses:  Neutropenic fever (HCC)  Mucositis  UTI due to extended-spectrum beta lactamase (ESBL) producing Escherichia coli    Rx / DC Orders ED Discharge Orders    None       Pollyann Savoy, MD 09/09/20 1949

## 2020-09-09 NOTE — ED Notes (Signed)
Assisted with bedside commode 

## 2020-09-09 NOTE — ED Notes (Signed)
Pt resting comfortably in stretcher. Pt aware that we are awaiting bed assignment. Pt updated to inform nurse if she needs anything.

## 2020-09-09 NOTE — Sepsis Progress Note (Signed)
[facility] is monitoring this sepsis 

## 2020-09-09 NOTE — ED Notes (Signed)
Pt hand off to [place] with [facility]. Updated about pt and IV meds running. Update upon arrival of crew.

## 2020-09-09 NOTE — ED Triage Notes (Signed)
Brought in from home by EMS , c/o mouth swelling with slight improvement form Thursday

## 2020-09-09 NOTE — Telephone Encounter (Signed)
Received consult, unable to see her because she is at [facility] If admitting physician can order the following tests for tomorrow, that would be helpful: 1) repeat CBC with diff 2) save smear 3) B12 4) consult GI for liver failure 5) RBC folate  No need transfuse unless bleeding or if platelet is less than 10 I will see her tomorrow

## 2020-09-10 ENCOUNTER — Encounter (HOSPITAL_COMMUNITY): Payer: Self-pay | Admitting: Family Medicine

## 2020-09-10 ENCOUNTER — Inpatient Hospital Stay (HOSPITAL_COMMUNITY): Payer: Self-pay

## 2020-09-10 DIAGNOSIS — N390 Urinary tract infection, site not specified: Secondary | ICD-10-CM | POA: Diagnosis present

## 2020-09-10 DIAGNOSIS — K1230 Oral mucositis (ulcerative), unspecified: Secondary | ICD-10-CM

## 2020-09-10 DIAGNOSIS — N289 Disorder of kidney and ureter, unspecified: Secondary | ICD-10-CM

## 2020-09-10 DIAGNOSIS — R9431 Abnormal electrocardiogram [ECG] [EKG]: Secondary | ICD-10-CM | POA: Diagnosis present

## 2020-09-10 DIAGNOSIS — R5081 Fever presenting with conditions classified elsewhere: Secondary | ICD-10-CM

## 2020-09-10 DIAGNOSIS — B9629 Other Escherichia coli [E. coli] as the cause of diseases classified elsewhere: Secondary | ICD-10-CM | POA: Diagnosis present

## 2020-09-10 DIAGNOSIS — E876 Hypokalemia: Secondary | ICD-10-CM | POA: Diagnosis present

## 2020-09-10 DIAGNOSIS — D61818 Other pancytopenia: Secondary | ICD-10-CM | POA: Diagnosis present

## 2020-09-10 DIAGNOSIS — M069 Rheumatoid arthritis, unspecified: Secondary | ICD-10-CM | POA: Diagnosis present

## 2020-09-10 DIAGNOSIS — D709 Neutropenia, unspecified: Secondary | ICD-10-CM

## 2020-09-10 LAB — COMPREHENSIVE METABOLIC PANEL
ALT: 13 U/L (ref 0–44)
AST: 18 U/L (ref 15–41)
Albumin: 2.8 g/dL — ABNORMAL LOW (ref 3.5–5.0)
Alkaline Phosphatase: 168 U/L — ABNORMAL HIGH (ref 38–126)
Anion gap: 13 (ref 5–15)
BUN: 40 mg/dL — ABNORMAL HIGH (ref 8–23)
CO2: 15 mmol/L — ABNORMAL LOW (ref 22–32)
Calcium: 8.7 mg/dL — ABNORMAL LOW (ref 8.9–10.3)
Chloride: 107 mmol/L (ref 98–111)
Creatinine, Ser: 0.96 mg/dL (ref 0.44–1.00)
GFR, Estimated: >60 mL/min (ref >60)
Glucose, Bld: 102 mg/dL — ABNORMAL HIGH (ref 70–99)
Potassium: 2.9 mmol/L — ABNORMAL LOW (ref 3.5–5.1)
Sodium: 135 mmol/L (ref 135–145)
Total Bilirubin: 5.8 mg/dL — ABNORMAL HIGH (ref 0.3–1.2)
Total Protein: 7.3 g/dL (ref 6.5–8.1)

## 2020-09-10 LAB — LACTIC ACID, PLASMA: Lactic Acid, Venous: 1.7 mmol/L (ref 0.5–1.9)

## 2020-09-10 LAB — EPSTEIN-BARR VIRUS VCA, IGG: EBV VCA IgG: 251 U/mL — ABNORMAL HIGH (ref 0.0–17.9)

## 2020-09-10 LAB — B. BURGDORFI ANTIBODIES: B burgdorferi Ab IgG+IgM: <0.91 {ISR} (ref 0.00–0.90)

## 2020-09-10 LAB — CBC WITH DIFFERENTIAL/PLATELET
Abs Immature Granulocytes: 0 10*3/uL (ref 0.00–0.07)
Basophils Absolute: 0 10*3/uL (ref 0.0–0.1)
Basophils Relative: 0 %
Eosinophils Absolute: 0 10*3/uL (ref 0.0–0.5)
Eosinophils Relative: 25 %
HCT: 24.9 % — ABNORMAL LOW (ref 36.0–46.0)
Hemoglobin: 7.9 g/dL — ABNORMAL LOW (ref 12.0–15.0)
Immature Granulocytes: 0 %
Lymphocytes Relative: 67 %
Lymphs Abs: 0.1 10*3/uL — ABNORMAL LOW (ref 0.7–4.0)
MCH: 29.2 pg (ref 26.0–34.0)
MCHC: 31.7 g/dL (ref 30.0–36.0)
MCV: 91.9 fL (ref 80.0–100.0)
Monocytes Absolute: 0 10*3/uL — ABNORMAL LOW (ref 0.1–1.0)
Monocytes Relative: 8 %
Neutro Abs: 0 10*3/uL — CL (ref 1.7–7.7)
Neutrophils Relative %: 0 %
Platelets: 22 10*3/uL — CL (ref 150–400)
RBC: 2.71 MIL/uL — ABNORMAL LOW (ref 3.87–5.11)
RDW: 14 % (ref 11.5–15.5)
WBC: 0.1 10*3/uL — CL (ref 4.0–10.5)
nRBC: 0 % (ref 0.0–0.2)

## 2020-09-10 LAB — FOLATE: Folate: 1.5 ng/mL — ABNORMAL LOW (ref >5.9)

## 2020-09-10 LAB — HEMOGLOBIN AND HEMATOCRIT, BLOOD
HCT: 22.5 % — ABNORMAL LOW (ref 36.0–46.0)
Hemoglobin: 7.2 g/dL — ABNORMAL LOW (ref 12.0–15.0)

## 2020-09-10 LAB — PROTIME-INR
INR: 1.6 — ABNORMAL HIGH (ref 0.8–1.2)
Prothrombin Time: 18.1 seconds — ABNORMAL HIGH (ref 11.4–15.2)

## 2020-09-10 LAB — VITAMIN B12: Vitamin B-12: 233 pg/mL (ref 180–914)

## 2020-09-10 LAB — PARVOVIRUS B19 ANTIBODY, IGG AND IGM
Parovirus B19 IgG Abs: 0.2 index (ref 0.0–0.8)
Parovirus B19 IgM Abs: 0.1 index (ref 0.0–0.8)

## 2020-09-10 LAB — CMV IGM: CMV IgM: <30 AU/mL (ref 0.0–29.9)

## 2020-09-10 LAB — MRSA PCR SCREENING: MRSA by PCR: NEGATIVE

## 2020-09-10 LAB — PATHOLOGIST SMEAR REVIEW

## 2020-09-10 LAB — BILIRUBIN, DIRECT: Bilirubin, Direct: 3.7 mg/dL — ABNORMAL HIGH (ref 0.0–0.2)

## 2020-09-10 LAB — EPSTEIN-BARR VIRUS VCA, IGM: EBV VCA IgM: <36 U/mL (ref 0.0–35.9)

## 2020-09-10 LAB — APTT: aPTT: 32 seconds (ref 24–36)

## 2020-09-10 LAB — SAVE SMEAR(SSMR), FOR PROVIDER SLIDE REVIEW

## 2020-09-10 LAB — MAGNESIUM: Magnesium: 2.2 mg/dL (ref 1.7–2.4)

## 2020-09-10 MED ORDER — POTASSIUM CHLORIDE 2 MEQ/ML IV SOLN: INTRAVENOUS | Status: DC

## 2020-09-10 MED ORDER — SODIUM CHLORIDE 0.9% IV SOLUTION: Freq: Once | INTRAVENOUS | Status: DC

## 2020-09-10 MED ORDER — KCL-LACTATED RINGERS-D5W 20 MEQ/L IV SOLN
INTRAVENOUS | Status: DC
  Filled 2020-09-10 (×2): qty 1000

## 2020-09-10 MED ORDER — FOLIC ACID 1 MG PO TABS
1.0000 mg | ORAL_TABLET | Freq: Every day | ORAL | Status: DC
  Administered 2020-09-10: 1 mg via ORAL
  Filled 2020-09-10: qty 1

## 2020-09-10 MED ORDER — ACETAMINOPHEN 325 MG PO TABS: 650.0000 mg | ORAL_TABLET | Freq: Four times a day (QID) | ORAL | Status: DC | PRN

## 2020-09-10 MED ORDER — LIP MEDEX EX OINT
TOPICAL_OINTMENT | CUTANEOUS | Status: AC
  Administered 2020-09-10: 1 via TOPICAL
  Filled 2020-09-10: qty 7

## 2020-09-10 MED ORDER — LORAZEPAM 2 MG/ML IJ SOLN
0.5000 mg | Freq: Once | INTRAMUSCULAR | Status: AC
  Administered 2020-09-10: 0.5 mg via INTRAVENOUS
  Filled 2020-09-10: qty 1

## 2020-09-10 MED ORDER — POTASSIUM CHLORIDE 10 MEQ/100ML IV SOLN
10.0000 meq | INTRAVENOUS | Status: AC
  Administered 2020-09-10 (×4): 10 meq via INTRAVENOUS
  Filled 2020-09-10 (×4): qty 100

## 2020-09-10 MED ORDER — MAGIC MOUTHWASH W/LIDOCAINE
5.0000 mL | Freq: Three times a day (TID) | ORAL | Status: DC
  Administered 2020-09-10: 5 mL via ORAL
  Filled 2020-09-10 (×2): qty 5

## 2020-09-10 MED ORDER — VITAMIN B-12 1000 MCG PO TABS
1000.0000 ug | ORAL_TABLET | Freq: Every day | ORAL | Status: DC
  Administered 2020-09-10 – 2020-09-18 (×5): 1000 ug via ORAL
  Filled 2020-09-10 (×8): qty 1

## 2020-09-10 MED ORDER — WHITE PETROLATUM EX OINT
TOPICAL_OINTMENT | Freq: Two times a day (BID) | CUTANEOUS | Status: DC
  Filled 2020-09-10: qty 5

## 2020-09-10 MED ORDER — POTASSIUM CHLORIDE 20 MEQ PO PACK: 40.0000 meq | PACK | Freq: Once | ORAL | Status: DC

## 2020-09-10 MED ORDER — OXYCODONE HCL 5 MG PO TABS
5.0000 mg | ORAL_TABLET | ORAL | Status: DC | PRN
  Administered 2020-09-10 – 2020-09-18 (×11): 5 mg via ORAL
  Filled 2020-09-10 (×11): qty 1

## 2020-09-10 MED ORDER — POTASSIUM CHLORIDE CRYS ER 20 MEQ PO TBCR
40.0000 meq | EXTENDED_RELEASE_TABLET | Freq: Once | ORAL | Status: DC
  Filled 2020-09-10: qty 2

## 2020-09-10 MED ORDER — WHITE PETROLATUM EX OINT
TOPICAL_OINTMENT | Freq: Three times a day (TID) | CUTANEOUS | Status: DC
  Administered 2020-09-11 – 2020-09-15 (×5): 1 via TOPICAL
  Filled 2020-09-10 (×3): qty 5

## 2020-09-10 MED ORDER — PROSOURCE PLUS PO LIQD
30.0000 mL | Freq: Two times a day (BID) | ORAL | Status: DC
  Administered 2020-09-12 – 2020-09-17 (×6): 30 mL via ORAL
  Filled 2020-09-10 (×10): qty 30

## 2020-09-10 MED ORDER — SODIUM CHLORIDE 0.9 % IV SOLN: INTRAVENOUS | Status: DC

## 2020-09-10 MED ORDER — LIP MEDEX EX OINT: TOPICAL_OINTMENT | CUTANEOUS | Status: DC | PRN

## 2020-09-10 MED ORDER — POTASSIUM CHLORIDE CRYS ER 20 MEQ PO TBCR: 20.0000 meq | EXTENDED_RELEASE_TABLET | ORAL | Status: DC

## 2020-09-10 MED ORDER — PANTOPRAZOLE SODIUM 40 MG IV SOLR
40.0000 mg | Freq: Two times a day (BID) | INTRAVENOUS | Status: DC
  Administered 2020-09-10 – 2020-09-11 (×3): 40 mg via INTRAVENOUS
  Filled 2020-09-10 (×3): qty 40

## 2020-09-10 MED ORDER — MAGIC MOUTHWASH W/LIDOCAINE
5.0000 mL | Freq: Three times a day (TID) | ORAL | Status: DC | PRN
  Administered 2020-09-10 (×2): 5 mL via ORAL
  Filled 2020-09-10 (×3): qty 5

## 2020-09-10 MED ORDER — ENSURE ENLIVE PO LIQD
237.0000 mL | Freq: Two times a day (BID) | ORAL | Status: DC
  Administered 2020-09-10: 237 mL via ORAL

## 2020-09-10 MED ORDER — SENNOSIDES-DOCUSATE SODIUM 8.6-50 MG PO TABS: 1.0000 | ORAL_TABLET | Freq: Every evening | ORAL | Status: DC | PRN

## 2020-09-10 MED ORDER — SODIUM CHLORIDE 0.9 % IV SOLN
1.0000 g | Freq: Two times a day (BID) | INTRAVENOUS | Status: DC
  Administered 2020-09-10 – 2020-09-11 (×2): 1 g via INTRAVENOUS
  Filled 2020-09-10 (×2): qty 1

## 2020-09-10 MED ORDER — ACETAMINOPHEN 650 MG RE SUPP: 650.0000 mg | Freq: Four times a day (QID) | RECTAL | Status: DC | PRN

## 2020-09-10 MED ORDER — FENTANYL CITRATE (PF) 100 MCG/2ML IJ SOLN
50.0000 ug | INTRAMUSCULAR | Status: DC | PRN
  Administered 2020-09-10 – 2020-09-17 (×21): 50 ug via INTRAVENOUS
  Filled 2020-09-10 (×21): qty 2

## 2020-09-10 MED ORDER — SODIUM CHLORIDE (PF) 0.9 % IJ SOLN
INTRAMUSCULAR | Status: AC
  Filled 2020-09-10: qty 10

## 2020-09-10 MED ORDER — SUCRALFATE 1 GM/10ML PO SUSP
1.0000 g | Freq: Three times a day (TID) | ORAL | Status: DC
  Administered 2020-09-10 – 2020-09-18 (×23): 1 g via ORAL
  Filled 2020-09-10 (×25): qty 10

## 2020-09-10 NOTE — Progress Notes (Signed)
Receive report from central tele  that patient had a RUN of SVT. Vital signs are stable at baseline per patient and EKG obtain and shows NSR. Patient states "little tightness on my chest". In review of telemetry at 7:04am she was in Afib and at 9:11AM she had 8 beats of VTach and currently she on Sinus tach. Will notify provider.

## 2020-09-10 NOTE — Progress Notes (Signed)
CRITICAL VALUE ALERT  Critical Value:  WBC 0.1  Date & Time Notified: 09/10/2020 0143  Provider Notified: Odie Sera MD  Orders Received/Actions taken: No new orders at this time

## 2020-09-10 NOTE — Progress Notes (Signed)
Pharmacy Antibiotic Note  Kim Ponce is a 70 y.o. female admitted on 09/09/2020 with sepsis/ UTI.  Pharmacy has been consulted for Meropenem dosing.  Plan: Meropenem 1gm IV q8h Monitor renal function and cx data    Height: 5\' 6"  (167.6 cm) Weight: 50.8 kg (112 lb) IBW/kg (Calculated) : 59.3  Temp (24hrs), Avg:99.2 F (37.3 C), Min:97.6 F (36.4 C), Max:101.7 F (38.7 C)  Recent Labs  Lab 09/09/20 1354 09/09/20 1652 09/10/20 0045  WBC <0.1*  --  0.1*  CREATININE 1.19*  --  0.96  LATICACIDVEN 2.6* 2.2* 1.7    Estimated Creatinine Clearance: 45 mL/min (by C-G formula based on SCr of 0.96 mg/dL).    Allergies  Allergen Reactions  . Morphine And Related Hives  . Penicillins Hives                       . Sulfa Antibiotics Other (See Comments)    REACTION:  unknown  . Codeine Rash    Antimicrobials this admission: 1/17 Meropenem >>  1/17 Vanc x1  Dose adjustments this admission:  Microbiology results: 1/17 BCx:  1/17 UCx:   1/17 Resp PRC: negative for COVID, influenza 1/18 MRSA PCR:   1/13 UCx: >100K ESBL Ecoli- sens imipenem  Thank you for allowing pharmacy to be a part of this patient's care.  2/13 PharmD 09/10/2020 1:47 AM

## 2020-09-10 NOTE — Progress Notes (Addendum)
PROGRESS NOTE    Kim Ponce  ZOX:[id number] DOB: Feb 21, 1951 DOA: 09/09/2020 PCP: Patient, No Pcp Per   Chief Complaint  Patient presents with  . Fever    Brief Narrative:  Kim Ponce is Kim Ponce 70 y.o. female with medical history significant for rheumatoid arthritis, microcytic anemia, and recent treatment of UTI, now presenting to the emergency department for evaluation of mouth pain and ongoing dysuria.  Patient had previously been on methotrexate for RA, was off of it for Kim Ponce while before restarting approximately 2 weeks ago, then presented to [facility] on 09/06/2019 with dysuria, mouth sores, and rash on her back, was started on Macrobid for UTI and told to hold her methotrexate in light of the mouth sores.  Since then, she has ongoing dysuria, culture has grown ESBL, mouth pain and sores persist, she believes that the rash on her back has improved some, and she also reports that she was having some vulvar swelling and sores that is improving.  She denies any shortness of breath, cough, flank pain, chest pain, headache, or focal numbness or weakness.  [facility] ED Course: Upon arrival to the ED, patient is found to be febrile to 38.7 C, saturating well on room air, and tachycardic to 137 with stable blood pressure.  EKG features sinus tachycardia with rate 121 and QTC 521 ms.  Chest x-ray is negative for acute cardiopulmonary disease.  Chemistry panel notable for potassium 2.7, BUN 46, bicarbonate 15, creatinine 1.19, alkaline phosphatase 214, albumin 3.0, and total bilirubin 6.9.  CBC features Kim Ponce pancytopenia with WBC undetectably low, ANC 0, ALC 200, hemoglobin 10.2, and platelets 49,000.  Lactic acid was elevated to 2.6.  Blood and urine cultures were collected and the patient was given 30 cc/kg LR, 60 mill equivalents IV potassium, acetaminophen, vancomycin, and meropenem.  Hematology-oncology was consulted, hospitalists asked to admit, and the patient was transferred to [facility] for admission.  Currently being treated for methotrexate toxicity with pancytopenia.  Mucocutaneous involvement likely 2/2 methotrexate toxicity, though can't r/o SJS.  Will discuss transfer to facility with dermatology.  Assessment & Plan:   Principal Problem:   Neutropenic fever (HCC) Active Problems:   Pancytopenia (HCC)   Hypokalemia   UTI due to extended-spectrum beta lactamase (ESBL) producing Escherichia coli   Hyperbilirubinemia   Renal insufficiency   Prolonged QT interval   Mucositis   Rheumatoid arthritis (HCC)  Pancytopenia  Neutropenic Fever Suspected 2/2 methotrexate toxicity Oncology advised against resumption of methotrexate in future Folate is low, supplement B12 borderline, supplementing, follow [facility] Meropenem for neutropenic fever (ESBL e coli UTI) Follow blood and urine cultures Transfuse for Hb < 7 (will transfuse 1 unit pRBC now given downtrend - discussed risks/benefits with pt) Transfuse for platelets <10,000 Appreciate hematology/oncology recommendations - anticipated this will take several weeks for bone marrow recovery, no recommendation for G-CSF unless septic shock  Rash  Mucositis  Vaginal Lesions Likely all related to methotrexate toxicity, though constellation of mucocutaneous involvement is also concerning for SJS and this is difficult to exclude.  I think transfer to [place] facility with dermatology availability would be preferable if possible.  [facility] to [facility] pt, but no beds available at [facility] (Dr. Paulita Fujita, derm resident noted presentation sounded c/w methotrexate toxicity, though limited as unable to see/examine pt - they were happy to see in consultation if bed available).  Called [facility] for possible transfer as well -> no beds available,  [facility] derm (resident) recommended vaseline to lips, consider gyn c/s for vaginal involvement.   Poor PO intake, full liquid diet CMV IgM negative, negative  parvovirus B19, negative b burgdorferi Ab, RSV, EBV IgM/IgG pending  Hyperbilirubinemia Likely 2/2 methotrexate toxicity.  Macrobid can also cause liver inflammation. RUQ [place] without biliary obstruction Appreciate GI recs  ESBL UTI Continue meropenem Urine cx from 1/13 with ESBL e. Coli UTI  AKI  NAGMA Improving with IVF, continue to monitor, may need bicarb  Hypokalemia Replace and follow  Prolonged QTc QTc 521 - replace electrolytes Follow repeat EKG - improved Avoid QT prolonging meds  DVT prophylaxis: SCD Code Status: full  Family Communication: none at bedside Disposition:   Status is: Inpatient  Remains inpatient appropriate because:Inpatient level of care appropriate due to severity of illness   Dispo: The patient is from: Home              Anticipated d/c is to: Home              Anticipated d/c date is: > 3 days              Patient currently is not medically stable to d/c.       Consultants:   Oncology  GI   Procedures:   none  Antimicrobials:  Anti-infectives (From admission, onward)   Start     Dose/Rate Route Frequency Ordered Stop   09/10/20 1800  meropenem (MERREM) 1 g in sodium chloride 0.9 % 100 mL IVPB        1 g 200 mL/hr over 30 Minutes Intravenous Every 12 hours 09/10/20 0854     09/09/20 1545  vancomycin (VANCOCIN) 1,016 mg in sodium chloride 0.9 % 500 mL IVPB  Status:  Discontinued        20 mg/kg  50.8 kg 250 mL/hr over 120 Minutes Intravenous  Once 09/09/20 1532 09/09/20 1535   09/09/20 1545  vancomycin (VANCOCIN) IVPB 1000 mg/200 mL premix        1,000 mg 200 mL/hr over 60 Minutes Intravenous  Once 09/09/20 1535 09/09/20 1729   09/09/20 1530  meropenem (MERREM) 1 g in sodium chloride 0.9 % 100 mL IVPB  Status:  Discontinued        1 g 200 mL/hr over 30 Minutes Intravenous Every 8 hours 09/09/20 1516 09/09/20 1516   09/09/20 1515  meropenem (MERREM) 1 g in sodium chloride 0.9 % 100 mL IVPB  Status:  Discontinued        1  g 200 mL/hr over 30 Minutes Intravenous Every 8 hours 09/09/20 1504 09/10/20 0854         Subjective: Mouth soreness improving  Objective: Vitals:   09/10/20 0106 09/10/20 0444 09/10/20 0931 09/10/20 1241  BP: 120/75 126/81 (!) 134/92 106/65  Pulse: (!) 105 (!) 105 95 98  Resp: (!) 25 20 20 12   Temp: 97.6 F (36.4 C) 98.5 F (36.9 C) 99.4 F (37.4 C) 99.2 F (37.3 C)  TempSrc:   Oral Oral  SpO2: 100% 99% 100% 99%  Weight:      Height:        Intake/Output Summary (Last 24 hours) at 09/10/2020 1826 Last data filed at 09/10/2020 1400 Gross per 24 hour  Intake 2050.64 ml  Output 150 ml  Net 1900.64 ml   Filed Weights   09/09/20 1346  Weight: 50.8 kg    Examination:  General exam: Appears calm and comfortable  HEENT: oral lesions to lips, tongue with  desquamative lesions Respiratory system: Clear to auscultation. Respiratory effort normal. Cardiovascular system: S1 & S2 heard, RRR. Gastrointestinal system: Abdomen is nondistended, soft and nontender Central nervous system: Alert and oriented. No focal neurological deficits. Extremities: no LEE Skin: nontender erythematous macules to back, many with area of central dark area, vaginal mucosal lesions Psychiatry: Judgement and insight appear normal. Mood & affect appropriate.              Data Reviewed: I have personally reviewed following labs and imaging studies  CBC: Recent Labs  Lab 09/09/20 1354 09/10/20 0045 09/10/20 1445  WBC <0.1* 0.1*  --   NEUTROABS 0.0* 0.0*  --   HGB 10.2* 7.9* 7.2*  HCT 31.2* 24.9* 22.5*  MCV 89.7 91.9  --   PLT 49* 22*  --     Basic Metabolic Panel: Recent Labs  Lab 09/09/20 1354 09/10/20 0045  NA 135 135  K 2.7* 2.9*  CL 101 107  CO2 15* 15*  GLUCOSE 136* 102*  BUN 46* 40*  CREATININE 1.19* 0.96  CALCIUM 9.5 8.7*  MG  --  2.2    GFR: Estimated Creatinine Clearance: 45 mL/min (by C-G formula based on SCr of 0.96 mg/dL).  Liver Function  Tests: Recent Labs  Lab 09/09/20 1354 09/10/20 0045  AST 22 18  ALT 14 13  ALKPHOS 214* 168*  BILITOT 6.9* 5.8*  PROT 8.4* 7.3  ALBUMIN 3.0* 2.8*    CBG: No results for input(s): GLUCAP in the last 168 hours.   Recent Results (from the past 240 hour(s))  Urine culture     Status: Abnormal   Collection Time: 09/05/20  1:21 PM   Specimen: Urine, Clean Catch  Result Value Ref Range Status   Specimen Description   Final    URINE, CLEAN CATCH Performed at [facility], [address] [place], [place] [address]    Special Requests   Final    NONE Performed at [facility], [address] [place], [place] [address]    Culture (Kynzi Levay)  Final    >=100,000 COLONIES/mL ESCHERICHIA COLI Confirmed Extended Spectrum Beta-Lactamase Producer (ESBL).  In bloodstream infections from ESBL organisms, carbapenems are preferred over piperacillin/tazobactam. They are shown to have Jasmeet Gehl lower risk of mortality.    Report Status 09/08/2020 FINAL  Final   Organism ID, Bacteria ESCHERICHIA COLI (Su D[place])  Final      Susceptibility   Escherichia coli - MIC*    AMPICILLIN >=32 RESISTANT Resistant     CEFAZOLIN >=64 RESISTANT Resistant     CEFEPIME 16 RESISTANT Resistant     CEFTRIAXONE >=64 RESISTANT Resistant     CIPROFLOXACIN >=4 RESISTANT Resistant     GENTAMICIN <=1 SENSITIVE Sensitive     IMIPENEM <=0.25 SENSITIVE Sensitive     NITROFURANTOIN <=16 SENSITIVE Sensitive     TRIMETH/SULFA >=320 RESISTANT Resistant     AMPICILLIN/SULBACTAM 4 SENSITIVE Sensitive     PIP/TAZO <=4 SENSITIVE Sensitive     * >=100,000 COLONIES/mL ESCHERICHIA COLI  Resp Panel by RT-PCR (Flu Akiem Urieta&B, Covid) Nasopharyngeal Swab     Status: None   Collection Time: 09/09/20  2:56 PM   Specimen: Nasopharyngeal Swab; Nasopharyngeal(NP) swabs in vial transport medium  Result Value Ref Range Status   SARS Coronavirus 2 by RT PCR NEGATIVE NEGATIVE Final    Comment: (NOTE) SARS-CoV-2 target nucleic acids are NOT  DETECTED.  The SARS-CoV-2 RNA is generally detectable in upper respiratory specimens during the acute phase of  infection. The lowest concentration of SARS-CoV-2 viral copies this assay can detect is 138 copies/mL. Selda Jalbert negative result does not preclude SARS-Cov-2 infection and should not be used as the sole basis for treatment or other patient management decisions. Ether Wolters negative result may occur with  improper specimen collection/handling, submission of specimen other than nasopharyngeal swab, presence of viral mutation(s) within the areas targeted by this assay, and inadequate number of viral copies(<138 copies/mL). Mesha Schamberger negative result must be combined with clinical observations, patient history, and epidemiological information. The expected result is Negative.  Fact Sheet for Patients:  BloggerCourse.com  Fact Sheet for Healthcare Providers:  SeriousBroker.it  This test is no t yet approved or cleared by the [place] FDA and  has been authorized for detection and/or diagnosis of SARS-CoV-2 by FDA under an Emergency Use Authorization (EUA). This EUA will remain  in effect (meaning this test can be used) for the duration of the COVID-19 declaration under Section 564(b)(1) of the Act, 21 U.S.C.section 360bbb-3(b)(1), unless the authorization is terminated  or revoked sooner.       Influenza Darlena Koval by PCR NEGATIVE NEGATIVE Final   Influenza B by PCR NEGATIVE NEGATIVE Final    Comment: (NOTE) The Xpert Xpress SARS-CoV-2/FLU/RSV plus assay is intended as an aid in the diagnosis of influenza from Nasopharyngeal swab specimens and should not be used as Tawyna Pellot sole basis for treatment. Nasal washings and aspirates are unacceptable for Xpert Xpress SARS-CoV-2/FLU/RSV testing.  Fact Sheet for Patients: BloggerCourse.com  Fact Sheet for Healthcare Providers: SeriousBroker.it  This test is not yet  approved or cleared by the [place] FDA and has been authorized for detection and/or diagnosis of SARS-CoV-2 by FDA under an Emergency Use Authorization (EUA). This EUA will remain in effect (meaning this test can be used) for the duration of the COVID-19 declaration under Section 564(b)(1) of the Act, 21 U.S.C. section 360bbb-3(b)(1), unless the authorization is terminated or revoked.  Performed at [facility], [address] [place], [place] [address]   Culture, blood (routine x 2)     Status: None (Preliminary result)   Collection Time: 09/09/20  3:30 PM   Specimen: Right Antecubital; Blood  Result Value Ref Range Status   Specimen Description   Final    RIGHT ANTECUBITAL Performed at [facility], [address] [place], [place] [address]    Special Requests   Final    BOTTLES DRAWN AEROBIC AND ANAEROBIC Blood Culture adequate volume Performed at [facility], [address] [place], [place] [address]    Culture   Final    NO GROWTH < 12 HOURS Performed at [facility], [address], [place], [place] [address]    Report Status PENDING  Incomplete  Culture, blood (routine x 2)     Status: None (Preliminary result)   Collection Time: 09/09/20  3:40 PM   Specimen: Left Antecubital; Blood  Result Value Ref Range Status   Specimen Description   Final    LEFT ANTECUBITAL Performed at [facility], [address] [place], [place] [address]    Special Requests   Final    BOTTLES DRAWN AEROBIC AND ANAEROBIC Blood Culture adequate volume Performed at [facility], [address] [place], [place] [address]    Culture   Final    NO GROWTH < 12 HOURS Performed at [facility], [address] [place], [place] [address]  Report Status PENDING  Incomplete  MRSA PCR Screening     Status: None   Collection Time: 09/10/20  1:13 AM   Specimen: Nasal Mucosa; Nasopharyngeal  Result Value Ref Range Status    MRSA by PCR NEGATIVE NEGATIVE Final    Comment:        The GeneXpert MRSA Assay (FDA approved for NASAL specimens only), is one component of Shaunika Italiano comprehensive MRSA colonization surveillance program. It is not intended to diagnose MRSA infection nor to guide or monitor treatment for MRSA infections. Performed at [facility], [address], [place], [place] [address]          Radiology Studies: [facility] Chest Port 1 View  Result Date: 09/09/2020 CLINICAL DATA:  Fever and tachycardia EXAM: PORTABLE CHEST 1 VIEW COMPARISON:  September 05, 2020 FINDINGS: The lungs are clear. The heart size and pulmonary vascularity are normal. No adenopathy. No bone lesions. IMPRESSION: Lungs clear.  Cardiac silhouette. Electronically Signed   By: Bretta Bang III M.D.   On: 09/09/2020 15:20   US Abdomen Limited RUQ (LIVER/GB)  Result Date: 09/10/2020 CLINICAL DATA:  70 year old female with elevated bilirubin. EXAM: ULTRASOUND ABDOMEN LIMITED RIGHT UPPER QUADRANT COMPARISON:  None. FINDINGS: Gallbladder: No gallstones or wall thickening visualized. No sonographic Murphy sign noted by sonographer. Common bile duct: Diameter: 4 mm, normal. Liver: Liver echogenicity appears normal (image 41). No intrahepatic biliary ductal dilatation is evident. No discrete liver lesion identified. Portal vein is patent on color Doppler imaging with normal direction of blood flow towards the liver. Other: No free fluid.  Negative visible right renal upper pole. IMPRESSION: Normal right upper quadrant ultrasound. No explanation for elevated bilirubin. Electronically Signed   By: Odessa Fleming M.D.   On: 09/10/2020 08:55        Scheduled Meds: . (feeding supplement) PROSource Plus  30 mL Oral BID BM  . sodium chloride   Intravenous Once  . chlorhexidine  15 mL Mouth Rinse BID  . feeding supplement  237 mL Oral BID BM  . folic acid  1 mg Oral Daily  . magic mouthwash w/lidocaine  5 mL Oral TID  . mouth rinse   15 mL Mouth Rinse q12n4p  . pantoprazole (PROTONIX) IV  40 mg Intravenous Q12H  . sucralfate  1 g Oral TID WC & HS  . vitamin B-12  1,000 mcg Oral Daily   Continuous Infusions: . dextrose 5% lactated ringers with KCl 20 mEq/L    . meropenem (MERREM) IV    . potassium chloride       LOS: 1 day    Time spent: 40 min critical care time 2/2 neutropenic fever, pancytopenia    Lacretia Nicks, MD [facility]   To contact the attending provider between 7A-7P or the covering provider during after hours 7P-7A, please log into the web site [email or web address] and access using universal [place] password for that web site. If you do not have the password, please call the hospital operator.  09/10/2020, 6:26 PM

## 2020-09-10 NOTE — Consult Note (Signed)
[facility] Consultation Note  Referring Provider: [facility] Primary Care Physician:  Patient, No Pcp Per Primary Gastroenterologist:  None  Reason for Consultation:  Elevated bilirubin  HPI: Kim Ponce is a 70 y.o. female admitted neutropenic fevers and jaundice post-dating escalation of methotrexate for RA.  Has some lingering odynophagia, failure to thrive, weight loss (20 lbs over past several weeks).   Past Medical History:  Diagnosis Date  . Anemia   . Arthritis    RA and osteoarthritis  . Closed fracture of left distal radius   . Wears glasses     Past Surgical History:  Procedure Laterality Date  . ABDOMINAL HYSTERECTOMY    . BACK SURGERY     lumbar disectomy  . CARPAL TUNNEL RELEASE Left 11/13/2013   Procedure: LEFT CARPAL TUNNEL RELEASE;  Surgeon: Nestor Lewandowsky, MD;  Location: [facility];  Service: Orthopedics;  Laterality: Left;  . JOINT REPLACEMENT    . OPEN REDUCTION INTERNAL FIXATION (ORIF) DISTAL RADIAL FRACTURE Left 04/07/2018   Procedure: OPEN REDUCTION INTERNAL FIXATION (ORIF) DISTAL RADIAL FRACTURE;  Surgeon: Betha Loa, MD;  Location: [facility];  Service: Orthopedics;  Laterality: Left;  . SHOULDER ARTHROSCOPY WITH ROTATOR CUFF REPAIR Left 11/13/2013   Procedure: LEFT SHOULDER ARTHROSCOPY WITH DISTAL CLAVICLE ACROMIOPLASTY, LABRAL AND PARTIAL ROTATOR CUFF TEAR DEBRIDEMENT, SUBACROMIAL DECOMPRESSION;  Surgeon: Nestor Lewandowsky, MD;  Location: [facility];  Service: Orthopedics;  Laterality: Left;  . TOTAL HIP ARTHROPLASTY Left 12/25/2019   Procedure: LEFT TOTAL HIP ARTHROPLASTY ANTERIOR APPROACH;  Surgeon: [name], MD;  Location: [facility];  Service: Orthopedics;  Laterality: Left;  . TOTAL KNEE ARTHROPLASTY  09/26/2012   LEFT KNEE           Dr Turner Daniels   . TOTAL KNEE ARTHROPLASTY  09/26/2012   Procedure: TOTAL KNEE ARTHROPLASTY;  Surgeon: Nestor Lewandowsky, MD;  Location: [facility];  Service: Orthopedics;   Laterality: Left;    Prior to Admission medications   Medication Sig Start Date End Date Taking? Authorizing Provider  aspirin EC 81 MG tablet Take 81 mg by mouth every 4 (four) hours as needed for mild pain. Swallow whole.   Yes [provider]  celecoxib (CELEBREX) 200 MG capsule Take 200 mg by mouth 2 (two) times daily.   Yes [provider]  HYDROcodone-acetaminophen (NORCO/VICODIN) 5-325 MG tablet Take 1 tablet by mouth 3 (three) times daily as needed for pain. 08/12/20  Yes [provider]  nitrofurantoin, macrocrystal-monohydrate, (MACROBID) 100 MG capsule Take 1 capsule (100 mg total) by mouth 2 (two) times daily. 09/05/20  Yes [name], [name], PA-C  aspirin EC 81 MG tablet Take 1 tablet (81 mg total) by mouth 2 (two) times daily. Patient not taking: Reported on 09/10/2020 12/25/19   Allena Katz, PA-C  Etanercept (ENBREL [place]) Inject into the skin once a week.    [provider]    Current Facility-Administered Medications  Medication Dose Route Frequency Provider Last Rate Last Admin  . acetaminophen (TYLENOL) tablet 650 mg  650 mg Oral Q6H PRN Opyd, Lavone Neri, MD       Or  . acetaminophen (TYLENOL) suppository 650 mg  650 mg Rectal Q6H PRN Opyd, Lavone Neri, MD      . chlorhexidine (PERIDEX) 0.12 % solution 15 mL  15 mL Mouth Rinse BID Opyd, Lavone Neri, MD   15 mL at 09/10/20 0921  . feeding supplement (ENSURE ENLIVE / ENSURE PLUS) liquid 237 mL  237 mL Oral BID BM Opyd, Lavone Neri, MD   237 mL at 09/10/20 0924  . fentaNYL (SUBLIMAZE) injection 50 mcg  50 mcg Intravenous Q2H PRN Opyd, Lavone Neri, MD   50 mcg at 09/10/20 1047  . folic acid (FOLVITE) tablet 1 mg  1 mg Oral Daily Zigmund Daniel., MD   1 mg at 09/10/20 9798  . lip balm (CARMEX) ointment   Topical PRN Zigmund Daniel., MD   1 application at 09/10/20 [phone]  . magic mouthwash w/lidocaine  5 mL Oral TID PRN Opyd, Lavone Neri, MD   5 mL at 09/10/20 0841  . MEDLINE mouth rinse  15 mL  Mouth Rinse q12n4p Opyd, Lavone Neri, MD      . meropenem (MERREM) 1 g in sodium chloride 0.9 % 100 mL IVPB  1 g Intravenous Q12H Shade, Christine E, RPH      . oxyCODONE (Oxy IR/ROXICODONE) immediate release tablet 5 mg  5 mg Oral Q4H PRN Opyd, Lavone Neri, MD   5 mg at 09/10/20 0846  . potassium chloride SA (KLOR-CON) CR tablet 40 mEq  40 mEq Oral Once Zigmund Daniel., MD      . senna-docusate (Senokot-S) tablet 1 tablet  1 tablet Oral QHS PRN Opyd, Lavone Neri, MD      . vitamin B-12 (CYANOCOBALAMIN) tablet 1,000 mcg  1,000 mcg Oral Daily Zigmund Daniel., MD   1,000 mcg at 09/10/20 9417    Allergies as of 09/09/2020 - Review Complete 09/09/2020  Allergen Reaction Noted  . Morphine and related Hives 09/14/2012  . Penicillins Hives 09/14/2012  . Sulfa antibiotics Other (See Comments) 09/14/2012  . Codeine Rash 11/13/2013    History reviewed. No pertinent family history.  Social History   Socioeconomic History  . Marital status: Divorced    Spouse name: Not on file  . Number of children: Not on file  . Years of education: Not on file  . Highest education level: Not on file  Occupational History  . Not on file  Tobacco Use  . Smoking status: Never Smoker  . Smokeless tobacco: Never Used  Vaping Use  . Vaping Use: Never used  Substance and Sexual Activity  . Alcohol use: Yes    Comment: occassional  . Drug use: No  . Sexual activity: Not on file  Other Topics Concern  . Not on file  Social History Narrative  . Not on file   Social Determinants of Health   Financial Resource Strain: Not on file  Food Insecurity: Not on file  Transportation Needs: Not on file  Physical Activity: Not on file  Stress: Not on file  Social Connections: Not on file  Intimate Partner Violence: Not on file    Review of Systems:As per HPI, all others negative  Physical Exam: Vital signs in last 24 hours: Temp:  [97.6 F (36.4 C)-101.7 F (38.7 C)] 99.4 F (37.4 C) (01/18  0931) Pulse Rate:  [95-137] 95 (01/18 0931) Resp:  [16-28] 20 (01/18 0931) BP: (101-152)/(68-100) 134/92 (01/18 0931) SpO2:  [97 %-100 %] 100 % (01/18 0931) Weight:  [50.8 kg] 50.8 kg (01/17 1346)   General:   Alert,  Cachectic, chronically ill-appearing Head:  Normocephalic and atraumatic. Eyes:  Sclera clear, no icterus.   Conjunctiva pink. Ears:  Normal auditory acuity. Nose:  No deformity, discharge,  or lesions. Mouth:  No deformity or lesions. Poor dentition with some aphthous ulcerations  Neck:  Supple; no masses  or thyromegaly. Abdomen:  Soft, nontender and nondistended. No masses, hepatosplenomegaly or hernias noted. Normal bowel sounds, without guarding, and without rebound.     Msk:  Symmetrical without gross deformities. Normal posture. Pulses:  Normal pulses noted. Extremities:  Without clubbing or edema. Neurologic:  Alert and  oriented x4; diffusely weak, otherwise grossly normal neurologically. Skin:  Intact without significant lesions or rashes. Psych:  Alert and cooperative. Normal mood and affect.   Lab Results: Recent Labs    09/09/20 1354 09/10/20 0045  WBC <0.1* 0.1*  HGB 10.2* 7.9*  HCT 31.2* 24.9*  PLT 49* 22*   BMET Recent Labs    09/09/20 1354 09/10/20 0045  NA 135 135  K 2.7* 2.9*  CL 101 107  CO2 15* 15*  GLUCOSE 136* 102*  BUN 46* 40*  CREATININE 1.19* 0.96  CALCIUM 9.5 8.7*   LFT Recent Labs    09/10/20 0045  PROT 7.3  ALBUMIN 2.8*  AST 18  ALT 13  ALKPHOS 168*  BILITOT 5.8*  BILIDIR 3.7*   PT/INR Recent Labs    09/10/20 0045  LABPROT 18.1*  INR 1.6*    Studies/Results: DG Chest Port 1 View  Result Date: 09/09/2020 CLINICAL DATA:  Fever and tachycardia EXAM: PORTABLE CHEST 1 VIEW COMPARISON:  September 05, 2020 FINDINGS: The lungs are clear. The heart size and pulmonary vascularity are normal. No adenopathy. No bone lesions. IMPRESSION: Lungs clear.  Cardiac silhouette. Electronically Signed   By: Bretta Bang III  M.D.   On: 09/09/2020 15:20   US Abdomen Limited RUQ (LIVER/GB)  Result Date: 09/10/2020 CLINICAL DATA:  70 year old female with elevated bilirubin. EXAM: ULTRASOUND ABDOMEN LIMITED RIGHT UPPER QUADRANT COMPARISON:  None. FINDINGS: Gallbladder: No gallstones or wall thickening visualized. No sonographic Murphy sign noted by sonographer. Common bile duct: Diameter: 4 mm, normal. Liver: Liver echogenicity appears normal (image 41). No intrahepatic biliary ductal dilatation is evident. No discrete liver lesion identified. Portal vein is patent on color Doppler imaging with normal direction of blood flow towards the liver. Other: No free fluid.  Negative visible right renal upper pole. IMPRESSION: Normal right upper quadrant ultrasound. No explanation for elevated bilirubin. Electronically Signed   By: Odessa Fleming M.D.   On: 09/10/2020 08:55    Impression:  1.  Neutropenia with fevers.  2.  Pancytopenia, suspected methotrexate toxicity. 3.  Odynophagia. 4.  Weight loss. 5.  Elevated bilirubin.  No biliary obstruction on ultrasound.  Likely methotrexate toxicity.  Macrobid can cause liver inflammation, but usually more in a hepatocellular pattern.  Plan:  1.  Medical management (IVF, antibiotics). 2.  Follow LFTs supportively. 3.  PPI + sucralfate. 4.  De-escalate to full liquids. 5.  [facility] will follow.   LOS: 1 day   Harla Mensch M  09/10/2020, 12:30 PM  Cell [phone] If no answer or after 5 PM call [phone]

## 2020-09-10 NOTE — H&P (Signed)
History and Physical    Kim Ponce:[id number] DOB: [birth year] DOA: 09/09/2020  PCP: Patient, No Pcp Per   Patient coming from: Home   Chief Complaint: Dysuria, mouth pain and sores   HPI: Kim Ponce is a 70 y.o. female with medical history significant for rheumatoid arthritis, microcytic anemia, and recent treatment of UTI, now presenting to the emergency department for evaluation of mouth pain and ongoing dysuria.  Patient had previously been on methotrexate for RA, was off of it for a while before restarting approximately 2 weeks ago, then presented to [facility] on 09/06/2019 with dysuria, mouth sores, and rash on her back, was started on Macrobid for UTI and told to hold her methotrexate in light of the mouth sores.  Since then, she has ongoing dysuria, culture has grown ESBL, mouth pain and sores persist, she believes that the rash on her back has improved some, and she also reports that she was having some vulvar swelling and sores that is improving.  She denies any shortness of breath, cough, flank pain, chest pain, headache, or focal numbness or weakness.  [facility] ED Course: Upon arrival to the ED, patient is found to be febrile to 38.7 C, saturating well on room air, and tachycardic to 137 with stable blood pressure.  EKG features sinus tachycardia with rate 121 and QTC 521 ms.  Chest x-ray is negative for acute cardiopulmonary disease.  Chemistry panel notable for potassium 2.7, BUN 46, bicarbonate 15, creatinine 1.19, alkaline phosphatase 214, albumin 3.0, and total bilirubin 6.9.  CBC features a pancytopenia with WBC undetectably low, ANC 0, ALC 200, hemoglobin 10.2, and platelets 49,000.  Lactic acid was elevated to 2.6.  Blood and urine cultures were collected and the patient was given 30 cc/kg LR, 60 mill equivalents IV potassium, acetaminophen, vancomycin, and meropenem.  Hematology-oncology was consulted, hospitalists asked to admit, and the patient was  transferred to [facility] for admission.  Review of Systems:  All other systems reviewed and apart from HPI, are negative.  Past Medical History:  Diagnosis Date  . Anemia   . Arthritis    RA and osteoarthritis  . Closed fracture of left distal radius   . Wears glasses     Past Surgical History:  Procedure Laterality Date  . ABDOMINAL HYSTERECTOMY    . BACK SURGERY     lumbar disectomy  . CARPAL TUNNEL RELEASE Left 11/13/2013   Procedure: LEFT CARPAL TUNNEL RELEASE;  Surgeon: Nestor Lewandowsky, MD;  Location: [facility];  Service: Orthopedics;  Laterality: Left;  . JOINT REPLACEMENT    . OPEN REDUCTION INTERNAL FIXATION (ORIF) DISTAL RADIAL FRACTURE Left 04/07/2018   Procedure: OPEN REDUCTION INTERNAL FIXATION (ORIF) DISTAL RADIAL FRACTURE;  Surgeon: Betha Loa, MD;  Location: [facility];  Service: Orthopedics;  Laterality: Left;  . SHOULDER ARTHROSCOPY WITH ROTATOR CUFF REPAIR Left 11/13/2013   Procedure: LEFT SHOULDER ARTHROSCOPY WITH DISTAL CLAVICLE ACROMIOPLASTY, LABRAL AND PARTIAL ROTATOR CUFF TEAR DEBRIDEMENT, SUBACROMIAL DECOMPRESSION;  Surgeon: Nestor Lewandowsky, MD;  Location: [facility];  Service: Orthopedics;  Laterality: Left;  . TOTAL HIP ARTHROPLASTY Left 12/25/2019   Procedure: LEFT TOTAL HIP ARTHROPLASTY ANTERIOR APPROACH;  Surgeon: [name], MD;  Location: [facility];  Service: Orthopedics;  Laterality: Left;  . TOTAL KNEE ARTHROPLASTY  09/26/2012   LEFT KNEE           Dr Turner Daniels   . TOTAL KNEE ARTHROPLASTY  09/26/2012  Procedure: TOTAL KNEE ARTHROPLASTY;  Surgeon: Nestor Lewandowsky, MD;  Location: [facility];  Service: Orthopedics;  Laterality: Left;    Social History:   reports that she has never smoked. She has never used smokeless tobacco. She reports current alcohol use. She reports that she does not use drugs.  Allergies  Allergen Reactions  . Morphine And Related Hives  . Penicillins Hives                        . Sulfa Antibiotics Other (See Comments)    REACTION:  unknown  . Codeine Rash    History reviewed. No pertinent family history.   Prior to Admission medications   Medication Sig Start Date End Date Taking? Authorizing Provider  aspirin EC 81 MG tablet Take 1 tablet (81 mg total) by mouth 2 (two) times daily. 12/25/19   Allena Katz, PA-C  celecoxib (CELEBREX) 200 MG capsule Take 200 mg by mouth 2 (two) times daily.    [provider]  Etanercept (ENBREL [place]) Inject into the skin once a week.    [provider]  methotrexate 2.5 MG tablet Take 20 mg by mouth once a week.     [provider]  nitrofurantoin, macrocrystal-monohydrate, (MACROBID) 100 MG capsule Take 1 capsule (100 mg total) by mouth 2 (two) times daily. 09/05/20   [name], [name], PA-C  oxyCODONE-acetaminophen (PERCOCET/ROXICET) 5-325 MG tablet Take 1 tablet by mouth every 4 (four) hours as needed for severe pain. 12/25/19   Allena Katz, PA-C  tiZANidine (ZANAFLEX) 2 MG tablet Take 1 tablet (2 mg total) by mouth every 6 (six) hours as needed. 12/25/19   Allena Katz, PA-C    Physical Exam: Vitals:   09/09/20 1832 09/09/20 2000 09/09/20 2100 09/09/20 2300  BP: (!) 145/95 134/89 (!) 142/85 119/77  Pulse: (!) 105 97 99 100  Resp: (!) 22 19 18  (!) 28  Temp: 98.8 F (37.1 C)   98.2 F (36.8 C)  TempSrc: Oral     SpO2: 100% 97% 100% 100%  Weight:      Height:        Constitutional: NAD, calm  Eyes: PERTLA, lids and conjunctivae normal ENMT: Mucous membranes are erythematous and edematous.    Neck: normal, supple, no masses, no thyromegaly Respiratory: no wheezing, no crackles. No accessory muscle use.  Cardiovascular: Rate ~100-110 and regular. No extremity edema.   Abdomen: No distension, no tenderness, soft. Bowel sounds active.  Musculoskeletal: no clubbing / cyanosis. Ulnar deviation of fingers bilaterally.  Skin: Erythematous macules and papules  overly back, some with crust. Warm, dry, well-perfused. Neurologic: CN 2-12 grossly intact. Sensation intact. Moving all extremities.  Psychiatric: Alert and oriented to person, place, and situation. Very pleasant and cooperative.    Labs and Imaging on Admission: I have personally reviewed following labs and imaging studies  CBC: Recent Labs  Lab 09/09/20 1354  WBC <0.1*  NEUTROABS 0.0*  HGB 10.2*  HCT 31.2*  MCV 89.7  PLT 49*   Basic Metabolic Panel: Recent Labs  Lab 09/09/20 1354  NA 135  K 2.7*  CL 101  CO2 15*  GLUCOSE 136*  BUN 46*  CREATININE 1.19*  CALCIUM 9.5   GFR: Estimated Creatinine Clearance: 36.3 mL/min (A) (by C-G formula based on SCr of 1.19 mg/dL (H)). Liver Function Tests: Recent Labs  Lab 09/09/20 1354  AST 22  ALT 14  ALKPHOS 214*  BILITOT 6.9*  PROT 8.4*  ALBUMIN 3.0*   No results for input(s): LIPASE, AMYLASE in the last 168 hours. No results for input(s): AMMONIA in the last 168 hours. Coagulation Profile: No results for input(s): INR, PROTIME in the last 168 hours. Cardiac Enzymes: No results for input(s): CKTOTAL, CKMB, CKMBINDEX, TROPONINI in the last 168 hours. BNP (last 3 results) No results for input(s): PROBNP in the last 8760 hours. HbA1C: No results for input(s): HGBA1C in the last 72 hours. CBG: No results for input(s): GLUCAP in the last 168 hours. Lipid Profile: No results for input(s): CHOL, HDL, LDLCALC, TRIG, CHOLHDL, LDLDIRECT in the last 72 hours. Thyroid Function Tests: No results for input(s): TSH, T4TOTAL, FREET4, T3FREE, THYROIDAB in the last 72 hours. Anemia Panel: No results for input(s): VITAMINB12, FOLATE, FERRITIN, TIBC, IRON, RETICCTPCT in the last 72 hours. Urine analysis:    Component Value Date/Time   COLORURINE BROWN (A) 09/09/2020 1825   APPEARANCEUR CLOUDY (A) 09/09/2020 1825   LABSPEC 1.015 09/09/2020 1825   PHURINE 7.0 09/09/2020 1825   GLUCOSEU NEGATIVE 09/09/2020 1825   HGBUR LARGE (A)  09/09/2020 1825   BILIRUBINUR NEGATIVE 09/09/2020 1825   KETONESUR NEGATIVE 09/09/2020 1825   PROTEINUR 30 (A) 09/09/2020 1825   UROBILINOGEN 1.0 09/21/2012 1301   NITRITE NEGATIVE 09/09/2020 1825   LEUKOCYTESUR NEGATIVE 09/09/2020 1825   Sepsis Labs: @LABRCNTIP (procalcitonin:4,lacticidven:4) ) Recent Results (from the past 240 hour(s))  Urine culture     Status: Abnormal   Collection Time: 09/05/20  1:21 PM   Specimen: Urine, Clean Catch  Result Value Ref Range Status   Specimen Description   Final    URINE, CLEAN CATCH Performed at [facility], [address] [place], [place]    Special Requests   Final    NONE Performed at [facility], [address] [place], [place]    Culture (A)  Final    >=100,000 COLONIES/mL ESCHERICHIA COLI Confirmed Extended Spectrum Beta-Lactamase Producer (ESBL).  In bloodstream infections from ESBL organisms, carbapenems are preferred over piperacillin/tazobactam. They are shown to have a lower risk of mortality.    Report Status 09/08/2020 FINAL  Final   Organism ID, Bacteria ESCHERICHIA COLI (A)  Final      Susceptibility   Escherichia coli - MIC*    AMPICILLIN >=32 RESISTANT Resistant     CEFAZOLIN >=64 RESISTANT Resistant     CEFEPIME 16 RESISTANT Resistant     CEFTRIAXONE >=64 RESISTANT Resistant     CIPROFLOXACIN >=4 RESISTANT Resistant     GENTAMICIN <=1 SENSITIVE Sensitive     IMIPENEM <=0.25 SENSITIVE Sensitive     NITROFURANTOIN <=16 SENSITIVE Sensitive     TRIMETH/SULFA >=320 RESISTANT Resistant     AMPICILLIN/SULBACTAM 4 SENSITIVE Sensitive     PIP/TAZO <=4 SENSITIVE Sensitive     * >=100,000 COLONIES/mL ESCHERICHIA COLI  Resp Panel by RT-PCR (Flu A&B, Covid) Nasopharyngeal Swab     Status: None   Collection Time: 09/09/20  2:56 PM   Specimen: Nasopharyngeal Swab; Nasopharyngeal(NP) swabs in vial transport medium  Result Value Ref Range Status   SARS Coronavirus 2 by RT PCR NEGATIVE  NEGATIVE Final    Comment: (NOTE) SARS-CoV-2 target nucleic acids are NOT DETECTED.  The SARS-CoV-2 RNA is generally detectable in upper respiratory specimens during the acute phase of infection. The lowest concentration of SARS-CoV-2 viral copies this assay can detect is 138 copies/mL. A negative result does not preclude SARS-Cov-2 infection and should not be used as the  sole basis for treatment or other patient management decisions. A negative result may occur with  improper specimen collection/handling, submission of specimen other than nasopharyngeal swab, presence of viral mutation(s) within the areas targeted by this assay, and inadequate number of viral copies(<138 copies/mL). A negative result must be combined with clinical observations, patient history, and epidemiological information. The expected result is Negative.  Fact Sheet for Patients:  BloggerCourse.com  Fact Sheet for Healthcare Providers:  SeriousBroker.it  This test is no t yet approved or cleared by the [place] FDA and  has been authorized for detection and/or diagnosis of SARS-CoV-2 by FDA under an Emergency Use Authorization (EUA). This EUA will remain  in effect (meaning this test can be used) for the duration of the COVID-19 declaration under Section 564(b)(1) of the Act, 21 U.S.C.section 360bbb-3(b)(1), unless the authorization is terminated  or revoked sooner.       Influenza A by PCR NEGATIVE NEGATIVE Final   Influenza B by PCR NEGATIVE NEGATIVE Final    Comment: (NOTE) The Xpert Xpress SARS-CoV-2/FLU/RSV plus assay is intended as an aid in the diagnosis of influenza from Nasopharyngeal swab specimens and should not be used as a sole basis for treatment. Nasal washings and aspirates are unacceptable for Xpert Xpress SARS-CoV-2/FLU/RSV testing.  Fact Sheet for Patients: BloggerCourse.com  Fact Sheet for Healthcare  Providers: SeriousBroker.it  This test is not yet approved or cleared by the [place] FDA and has been authorized for detection and/or diagnosis of SARS-CoV-2 by FDA under an Emergency Use Authorization (EUA). This EUA will remain in effect (meaning this test can be used) for the duration of the COVID-19 declaration under Section 564(b)(1) of the Act, 21 U.S.C. section 360bbb-3(b)(1), unless the authorization is terminated or revoked.  Performed at [facility], [address] [place], [place] [address]      Radiological Exams on Admission: DG Chest [facility] 1 View  Result Date: 09/09/2020 CLINICAL DATA:  Fever and tachycardia EXAM: PORTABLE CHEST 1 VIEW COMPARISON:  September 05, 2020 FINDINGS: The lungs are clear. The heart size and pulmonary vascularity are normal. No adenopathy. No bone lesions. IMPRESSION: Lungs clear.  Cardiac silhouette. Electronically Signed   By: Bretta Bang III M.D.   On: 09/09/2020 15:20    EKG: Independently reviewed. Sinus tachycardia, rate 121, QTc 521 ms.   Assessment/Plan   1. Pancytopenia; neutropenia with fever  - Presents with mouth sores and dysuria and is found to have new pancytopenia with ANC 0.0, ALC 200, platelets 49k, and Hgb 10.2 with normal MCV  - Hematology-oncology consulting and much appreciated  - She was febrile, cultured, and treated with meropenem and vancomycin in ED  - Continue meropenem and neutropenic precautions, save smear, repeat CBC with diff, check B12 and folate    2. UTI  - Patient reports ongoing dysuria despite Macrobid, culture from 09/05/20 grew ESBL  - Meropenem as above    3. Hyperbilirubinemia  - Total bilirubin 6.9 in ED, no prior LFTs on file  - Fractionate bilirubin, check INR, trend  - Message sent to GI with routine consult request   4. Stomatitis, rash  - Possibly related to methotrexate which she is now holding, could also be viral or nutritional deficiency   - She reports improvement in rash on back and vulvar edema and sores  - Continue supportive care   5. Renal insufficiency  - SCr is 1.19, up from 0.64 in May 2021  - Likely acute kidney injury  -  She was given 2.75 liters IVF in ED - Renally-dose medications, repeat chem panel    6. Prolonged QT interval  - QTc is 521 ms in ED, likely d/t hypokalemia  - Continue cardiac monitoring, minimize QT-prolonging medications, monitor potassium and magnesium levels and replete as needed   7. Hypokalemia  - Serum potassium 2.7 in ED where she was given IV potassium  - Repeat chemistries    DVT prophylaxis: SCDs  Code Status: Full  Family Communication: Discussed with patient  Disposition Plan:  Patient is from: Home  Anticipated d/c is to: Home  Anticipated d/c date is: 09/14/20 Patient currently: pending repeat labs, improvement in fever/infectious parameters, specialist consultations  Consults called: Hematology-oncology consulted by ED physician; message sent to GI with request for routine AM consult  Admission status: Inpatient     Briscoe Deutscher, MD [facility]  09/10/2020, 12:46 AM

## 2020-09-10 NOTE — Progress Notes (Signed)
Pharmacy Antibiotic Note  Kim Ponce is a 70 y.o. female admitted on 09/09/2020 with sepsis/ UTI, history of ESBL organism, neutropenic fever.  Pharmacy has been consulted for Meropenem dosing. SCr 0.96 with CrCl ~ 45 ml/min Tm 101.7 WBC 0.1  Plan:  Meropenem 1gm IV q12h  Monitor renal function and cx data    Height: 5\' 6"  (167.6 cm) Weight: 50.8 kg (112 lb) IBW/kg (Calculated) : 59.3  Temp (24hrs), Avg:99.1 F (37.3 C), Min:97.6 F (36.4 C), Max:101.7 F (38.7 C)  Recent Labs  Lab 09/09/20 1354 09/09/20 1652 09/10/20 0045  WBC <0.1*  --  0.1*  CREATININE 1.19*  --  0.96  LATICACIDVEN 2.6* 2.2* 1.7    Estimated Creatinine Clearance: 45 mL/min (by C-G formula based on SCr of 0.96 mg/dL).    Allergies  Allergen Reactions  . Morphine And Related Hives  . Penicillins Hives                       . Sulfa Antibiotics Other (See Comments)    REACTION:  unknown  . Codeine Rash    Antimicrobials this admission: 1/17 Meropenem >>  1/17 Vanc x1  Dose adjustments this admission:  Microbiology results: 1/17 BCx:  1/17 UCx:   1/17 Resp PRC: negative for COVID, influenza 1/18 MRSA PCR:   1/13 UCx: >100K ESBL Ecoli- sens imipenem  Thank you for allowing pharmacy to be a part of this patient's care.  2/13 PharmD, BCPS Clinical Pharmacist [facility] main pharmacy [phone] 09/10/2020 8:53 AM

## 2020-09-10 NOTE — Progress Notes (Signed)
Initial Nutrition Assessment  RD working remotely.  DOCUMENTATION CODES:   Underweight  INTERVENTION:  - continue Ensure Enlive BID, each supplement provides 350 kcal and 20 grams of protein. - will order 30 ml Prosource Plus BID, each supplement provides 100 kcal and 15 grams protein.  - diet advancement as medically feasible. - complete NFPE when feasible.    NUTRITION DIAGNOSIS:   Increased nutrient needs related to acute illness as evidenced by estimated needs.  GOAL:   Patient will meet greater than or equal to 90% of their needs  MONITOR:   PO intake,Supplement acceptance,Diet advancement,Labs,Weight trends  REASON FOR ASSESSMENT:   Malnutrition Screening Tool  ASSESSMENT:   70 y.o. female with medical history of rheumatoid arthritis, microcytic anemia, and recent treatment of UTI. She presented to the ED for evaluation of mouth pain and ongoing dysuria.  Patient had previously been on methotrexate for RA, was off it for a while before restarting approximately 2 weeks PTA.  Diet changed from Heart Healthy to FLD today at 1238. Documented intakes of 0% of breakfast and of lunch today.  Unable to reach patient by phone.   She has not been seen by a [place] RD at any time in the past.   Weight yesterday was 112 lb and PTA the most recently documented weight was on 12/21/19 when she weighed 132 lb. This indicates 20 lb weight loss (15% body weight) in the past 8 months; significant for time frame.   Suspect some degree of malnutrition, but unable to confirm without being able to talk with patient and being able to perform NFPE.  Per notes: - pancytopenia, neutropenia with fever - mouth sores - UTI on abx - stomatitis, severe mucositis - renal insufficiency  - hypokalemia--repletion ordered - AKI d/t dehydration   Labs reviewed; K: 2.9 mmol/l, BUN: 40 mg/dl, Ca: 8.7 mg/dl. Medications reviewed; 1 mg folvite/day, 40 mg IV protonix BID, 10 mEq IV KCl x6 runs  1/17 and x4 runs 1/18, 40 mEq Klor-Con x1 dose 1/18, 1 g carafate TID, 1000 mcg oral cyanocobalamin/day.     NUTRITION - FOCUSED PHYSICAL EXAM:  unable to complete at this time.   Diet Order:   Diet Order            Diet full liquid Room service appropriate? Yes; Fluid consistency: Thin  Diet effective now                 EDUCATION NEEDS:   Not appropriate for education at this time  Skin:  Skin Assessment: Reviewed RN Assessment  Last BM:  PTA/unknown  Height:   Ht Readings from Last 1 Encounters:  09/09/20 5\' 6"  (1.676 m)    Weight:   Wt Readings from Last 1 Encounters:  09/09/20 50.8 kg     Estimated Nutritional Needs:  Kcal:  1600-1800 kcal Protein:  80-90 grams Fluid:  >/= 2.2 L/day      09/11/20, MS, RD, LDN, CNSC Inpatient Clinical Dietitian RD pager # available in [facility]  After hours/weekend pager # available in [facility]

## 2020-09-10 NOTE — Plan of Care (Signed)
  Problem: Education: Goal: Knowledge of General Education information will improve Description: Including pain rating scale, medication(s)/side effects and non-pharmacologic comfort measures Outcome: Progressing   Problem: Clinical Measurements: Goal: Will remain free from infection Outcome: Progressing Goal: Diagnostic test results will improve Outcome: Progressing   Problem: Nutrition: Goal: Adequate nutrition will be maintained Outcome: Progressing   Problem: Pain Managment: Goal: General experience of comfort will improve Outcome: Progressing   

## 2020-09-10 NOTE — Progress Notes (Signed)
CRITICAL VALUE ALERT  Critical Value:  Platelets 22   Date & Time Notified:  09/10/2020 0143  Provider Notified: Odie Sera MD  Orders Received/Actions taken: No new orders at this time

## 2020-09-11 DIAGNOSIS — R17 Unspecified jaundice: Secondary | ICD-10-CM

## 2020-09-11 LAB — CBC WITH DIFFERENTIAL/PLATELET
Abs Immature Granulocytes: 0 10*3/uL (ref 0.00–0.07)
Basophils Absolute: 0 10*3/uL (ref 0.0–0.1)
Basophils Relative: 0 %
Eosinophils Absolute: 0 10*3/uL (ref 0.0–0.5)
Eosinophils Relative: 4 %
HCT: 27.9 % — ABNORMAL LOW (ref 36.0–46.0)
Hemoglobin: 8.8 g/dL — ABNORMAL LOW (ref 12.0–15.0)
Immature Granulocytes: 0 %
Lymphocytes Relative: 86 %
Lymphs Abs: 0.4 10*3/uL — ABNORMAL LOW (ref 0.7–4.0)
MCH: 29.3 pg (ref 26.0–34.0)
MCHC: 31.5 g/dL (ref 30.0–36.0)
MCV: 93 fL (ref 80.0–100.0)
Monocytes Absolute: 0 10*3/uL — ABNORMAL LOW (ref 0.1–1.0)
Monocytes Relative: 6 %
Neutro Abs: 0 10*3/uL — CL (ref 1.7–7.7)
Neutrophils Relative %: 4 %
Platelets: 5 10*3/uL — CL (ref 150–400)
RBC: 3 MIL/uL — ABNORMAL LOW (ref 3.87–5.11)
RDW: 14.4 % (ref 11.5–15.5)
WBC: 0.5 10*3/uL — CL (ref 4.0–10.5)
nRBC: 0 % (ref 0.0–0.2)

## 2020-09-11 LAB — COMPREHENSIVE METABOLIC PANEL
ALT: 25 U/L (ref 0–44)
AST: 56 U/L — ABNORMAL HIGH (ref 15–41)
Albumin: 2.2 g/dL — ABNORMAL LOW (ref 3.5–5.0)
Alkaline Phosphatase: 131 U/L — ABNORMAL HIGH (ref 38–126)
Anion gap: 12 (ref 5–15)
BUN: 38 mg/dL — ABNORMAL HIGH (ref 8–23)
CO2: 16 mmol/L — ABNORMAL LOW (ref 22–32)
Calcium: 8.7 mg/dL — ABNORMAL LOW (ref 8.9–10.3)
Chloride: 116 mmol/L — ABNORMAL HIGH (ref 98–111)
Creatinine, Ser: 0.72 mg/dL (ref 0.44–1.00)
GFR, Estimated: >60 mL/min (ref >60)
Glucose, Bld: 109 mg/dL — ABNORMAL HIGH (ref 70–99)
Potassium: 3.1 mmol/L — ABNORMAL LOW (ref 3.5–5.1)
Sodium: 144 mmol/L (ref 135–145)
Total Bilirubin: 3.8 mg/dL — ABNORMAL HIGH (ref 0.3–1.2)
Total Protein: 6.2 g/dL — ABNORMAL LOW (ref 6.5–8.1)

## 2020-09-11 LAB — PHOSPHORUS: Phosphorus: 2.1 mg/dL — ABNORMAL LOW (ref 2.5–4.6)

## 2020-09-11 LAB — PREPARE RBC (CROSSMATCH)

## 2020-09-11 LAB — MAGNESIUM: Magnesium: 2.3 mg/dL (ref 1.7–2.4)

## 2020-09-11 MED ORDER — POTASSIUM CHLORIDE 10 MEQ/100ML IV SOLN
10.0000 meq | INTRAVENOUS | Status: AC
  Administered 2020-09-11 (×3): 10 meq via INTRAVENOUS
  Filled 2020-09-11 (×3): qty 100

## 2020-09-11 MED ORDER — MAGIC MOUTHWASH W/LIDOCAINE
10.0000 mL | Freq: Three times a day (TID) | ORAL | Status: DC
  Administered 2020-09-11 – 2020-09-17 (×20): 10 mL via ORAL
  Filled 2020-09-11 (×23): qty 10

## 2020-09-11 MED ORDER — LORAZEPAM 2 MG/ML IJ SOLN
0.5000 mg | Freq: Once | INTRAMUSCULAR | Status: AC
  Administered 2020-09-11: 0.5 mg via INTRAVENOUS
  Filled 2020-09-11: qty 1

## 2020-09-11 MED ORDER — DIPHENHYDRAMINE HCL 50 MG/ML IJ SOLN
12.5000 mg | Freq: Once | INTRAMUSCULAR | Status: AC
  Administered 2020-09-11: 12.5 mg via INTRAVENOUS
  Filled 2020-09-11: qty 1

## 2020-09-11 MED ORDER — ALPRAZOLAM 0.5 MG PO TABS: 0.5000 mg | ORAL_TABLET | Freq: Once | ORAL | Status: DC

## 2020-09-11 MED ORDER — SODIUM CHLORIDE 0.9% IV SOLUTION: Freq: Once | INTRAVENOUS | Status: AC

## 2020-09-11 MED ORDER — FOLIC ACID 5 MG/ML IJ SOLN
1.0000 mg | Freq: Every day | INTRAMUSCULAR | Status: DC
  Administered 2020-09-11 – 2020-09-18 (×7): 1 mg via INTRAVENOUS
  Filled 2020-09-11 (×9): qty 0.2

## 2020-09-11 MED ORDER — PANTOPRAZOLE SODIUM 40 MG IV SOLR
40.0000 mg | Freq: Two times a day (BID) | INTRAVENOUS | Status: DC
  Administered 2020-09-11 – 2020-09-18 (×14): 40 mg via INTRAVENOUS
  Filled 2020-09-11 (×14): qty 40

## 2020-09-11 MED ORDER — SODIUM CHLORIDE 0.9 % IV SOLN
1.0000 g | Freq: Three times a day (TID) | INTRAVENOUS | Status: DC
  Administered 2020-09-11 – 2020-09-12 (×3): 1 g via INTRAVENOUS
  Filled 2020-09-11 (×4): qty 1

## 2020-09-11 MED ORDER — POTASSIUM CHLORIDE 10 MEQ/100ML IV SOLN: 10.0000 meq | INTRAVENOUS | Status: DC

## 2020-09-11 MED ORDER — ZINC OXIDE 40 % EX OINT
TOPICAL_OINTMENT | Freq: Two times a day (BID) | CUTANEOUS | Status: DC
  Administered 2020-09-14: 1 via TOPICAL
  Filled 2020-09-11 (×2): qty 57

## 2020-09-11 MED ORDER — LACTATED RINGERS IV SOLN: INTRAVENOUS | Status: DC

## 2020-09-11 NOTE — Progress Notes (Signed)
PHARMACY NOTE:  ANTIMICROBIAL RENAL DOSAGE ADJUSTMENT  Current antimicrobial regimen includes a mismatch between antimicrobial dosage and estimated renal function.  As per policy approved by the Pharmacy & Therapeutics and Medical Executive Committees, the antimicrobial dosage will be adjusted accordingly.  Current antimicrobial dosage:  Meropenem 1 g iv q 12 hours  Indication: neutropenic fever  Renal Function:  Estimated Creatinine Clearance: 54 mL/min (by C-G formula based on SCr of 0.72 mg/dL). []      On intermittent HD, scheduled: []      On CRRT    Antimicrobial dosage has been changed to:  Meropenem 1 g iv q 8 hours  Additional comments:   Thank you for allowing pharmacy to be a part of this patient's care.  , [facility] 09/11/2020 7:28 AM

## 2020-09-11 NOTE — Progress Notes (Signed)
Subjective: Ongoing difficulty eating; speech therapy has seen patient. Tolerating full liquid diet.  Objective: Vital signs in last 24 hours: Temp:  [98.3 F (36.8 C)-99.2 F (37.3 C)] 98.4 F (36.9 C) (01/19 0600) Pulse Rate:  [82-103] 82 (01/19 0600) Resp:  [12-20] 18 (01/19 0600) BP: (106-130)/(65-89) 128/89 (01/19 0600) SpO2:  [98 %-100 %] 100 % (01/19 0600) Weight change:  Last BM Date: 09/09/20  PE: GEN:  Jaundiced, cachectic, chronically ill-appearing HEENT:  Profound mucositis SKIN:  Scattered ecchymoses  Lab Results: CBC    Component Value Date/Time   WBC 0.5 (LL) 09/11/2020 0526   RBC 3.00 (L) 09/11/2020 0526   HGB 8.8 (L) 09/11/2020 0526   HCT 27.9 (L) 09/11/2020 0526   PLT 5 (LL) 09/11/2020 0526   MCV 93.0 09/11/2020 0526   MCH 29.3 09/11/2020 0526   MCHC 31.5 09/11/2020 0526   RDW 14.4 09/11/2020 0526   LYMPHSABS 0.4 (L) 09/11/2020 0526   MONOABS 0.0 (L) 09/11/2020 0526   EOSABS 0.0 09/11/2020 0526   BASOSABS 0.0 09/11/2020 0526   CMP     Component Value Date/Time   NA 144 09/11/2020 0526   K 3.1 (L) 09/11/2020 0526   CL 116 (H) 09/11/2020 0526   CO2 16 (L) 09/11/2020 0526   GLUCOSE 109 (H) 09/11/2020 0526   BUN 38 (H) 09/11/2020 0526   CREATININE 0.72 09/11/2020 0526   CALCIUM 8.7 (L) 09/11/2020 0526   PROT 6.2 (L) 09/11/2020 0526   ALBUMIN 2.2 (L) 09/11/2020 0526   AST 56 (H) 09/11/2020 0526   ALT 25 09/11/2020 0526   ALKPHOS 131 (H) 09/11/2020 0526   BILITOT 3.8 (H) 09/11/2020 0526   GFRNONAA >60 09/11/2020 0526   GFRAA >60 12/26/2019 0321   Assessment:  1.  Neutropenia with fevers.  2.  Pancytopenia, suspected methotrexate toxicity. 3.  Odynophagia. Suspect from mucositis. 4.  Weight loss. 5.  Elevated bilirubin.  No biliary obstruction on ultrasound.  Levels downtrending.  Likely methotrexate toxicity.  Macrobid can cause liver inflammation, but usually more in a hepatocellular pattern  Plan:  1.  PPI and liquid sucralfate. 2.   Unfortunately, nothing further to be done from GI perspective.  I do not think there is any utility in endoscopy, and certainly with her profound pancytopenia any such type of procedure is contraindicated in absence of life-threatening circumstances. 3.  Advance diet, per speech therapy recs, as mucositis improves and patient tolerance/desire. 4.  [facility] will sign-off; please call with questions; thank you for the consultation.   Freddy Jaksch 09/11/2020, 10:29 AM   Cell [phone] If no answer or after 5 PM call [phone]

## 2020-09-11 NOTE — Progress Notes (Signed)
Kim Ponce   DOB:09/04/1950   AQ#:[id number]    ASSESSMENT & PLAN:  Pancytopenia with neutropenic fever Pancytopenia likely due to methotrexate toxicity No evidence to suggest an underlying bone marrow disorder -we do not plan to perform a bone marrow biopsy I have reviewed her hospitalization with the rheumatologist office and advised against resumption of methotrexate in the near future Folate level is significantly low and she will be started on folic acid 1 mg daily Recommend supportive care with transfusion support if hemoglobin is less than 7 or platelets are less than 10,000 She has received 1 unit of blood yesterday, on January 18 Given her severe thrombocytopenia and ongoing bleeding, I recommend platelet transfusion We discussed some of the risks, benefits, and alternatives of platelets transfusions. The patient is symptomatic from low platelet counts with bruising/bleeding/at high risk of life-threatening bleeding and the platelet count is critically low.  Some of the side-effects to be expected including risks of transfusion reactions, chills, infection, syndrome of volume overload and risk of hospitalization from various reasons and the patient is willing to proceed and went ahead to sign consent today.  Anticipate this will take several weeks for bone marrow recovery Follow CBC daily No need for G-CSF support unless she develops septic shock  Hyperbilirubinemia Unclear etiology, but most likely due to methotrexate toxicity Monitor, slowly improving  Urinary tract infection The patient has UTI symptoms Follow-up on urine culture Antibiotics per hospitalist; I recommend narrowing down the choice of antibiotics as soon as cultures are negative  Severe mucositis Due to methotrexate toxicity Continue supportive care with Magic mouthwash and pain medication  AKI, resolved Due to dehydration Creatinine is improving with IV fluids Continue IV fluids per  hospitalist  Discharge planning She will likely be here for 3 to 5 days  All questions were answered. The patient knows to call the clinic with any problems, questions or concerns.   The total time spent in the appointment was 25 minutes encounter with patients including review of chart and various tests results, discussions about plan of care and coordination of care plan  Heath Lark, MD 09/11/2020 10:50 AM  Subjective:  She is seen for further follow-up regarding pancytopenia due to methotrexate toxicity She has been having ongoing bleeding from her mouth; she was transfused 1 unit of blood yesterday She has difficulties eating because of pain She has been afebrile over 24 hours  Objective:  Vitals:   09/11/20 0356 09/11/20 0600  BP: 120/81 128/89  Pulse: 91 82  Resp: 20 18  Temp: 98.5 F (36.9 C) 98.4 F (36.9 C)  SpO2:  100%     Intake/Output Summary (Last 24 hours) at 09/11/2020 1050 Last data filed at 09/11/2020 0557 Gross per 24 hour  Intake 1851.76 ml  Output --  Net 1851.76 ml    GENERAL:alert, no distress and comfortable SKIN: Noted skin bruising EYES: normal, Conjunctiva are pink and non-injected, sclera clear OROPHARYNX: Persistent mucositis with dried blood in her mouth NEURO: alert & oriented x 3 with fluent speech, no focal motor/sensory deficits   Labs:  Recent Labs    12/21/19 1001 12/26/19 0321 09/09/20 1354 09/10/20 0045 09/11/20 0526  NA 139 137 135 135 144  K 4.2 4.6 2.7* 2.9* 3.1*  CL 109 109 101 107 116*  CO2 23 25 15* 15* 16*  GLUCOSE 102* 158* 136* 102* 109*  BUN 19 13 46* 40* 38*  CREATININE 0.67 0.64 1.19* 0.96 0.72  CALCIUM 9.1 8.5* 9.5 8.7* 8.7*  GFRNONAA >60 >60 50* >60 >60  GFRAA >60 >60  --   --   --   PROT  --   --  8.4* 7.3 6.2*  ALBUMIN  --   --  3.0* 2.8* 2.2*  AST  --   --  22 18 56*  ALT  --   --  '14 13 25  ' ALKPHOS  --   --  214* 168* 131*  BILITOT  --   --  6.9* 5.8* 3.8*  BILIDIR  --   --   --  3.7*  --      Studies:  DG Chest Port 1 View  Result Date: 09/09/2020 CLINICAL DATA:  Fever and tachycardia EXAM: PORTABLE CHEST 1 VIEW COMPARISON:  September 05, 2020 FINDINGS: The lungs are clear. The heart size and pulmonary vascularity are normal. No adenopathy. No bone lesions. IMPRESSION: Lungs clear.  Cardiac silhouette. Electronically Signed   By: Lowella Grip III M.D.   On: 09/09/2020 15:20   DG Chest Portable 1 View  Result Date: 09/05/2020 CLINICAL DATA:  Shortness of breath. EXAM: PORTABLE CHEST 1 VIEW COMPARISON:  December 21, 2019. FINDINGS: The heart size and mediastinal contours are within normal limits. Both lungs are clear. No visible pleural effusions or pneumothorax. No acute osseous abnormality. IMPRESSION: No active disease. Electronically Signed   By: Margaretha Sheffield MD   On: 09/05/2020 16:29   US Abdomen Limited RUQ (LIVER/GB)  Result Date: 09/10/2020 CLINICAL DATA:  70 year old female with elevated bilirubin. EXAM: ULTRASOUND ABDOMEN LIMITED RIGHT UPPER QUADRANT COMPARISON:  None. FINDINGS: Gallbladder: No gallstones or wall thickening visualized. No sonographic Murphy sign noted by sonographer. Common bile duct: Diameter: 4 mm, normal. Liver: Liver echogenicity appears normal (image 41). No intrahepatic biliary ductal dilatation is evident. No discrete liver lesion identified. Portal vein is patent on color Doppler imaging with normal direction of blood flow towards the liver. Other: No free fluid.  Negative visible right renal upper pole. IMPRESSION: Normal right upper quadrant ultrasound. No explanation for elevated bilirubin. Electronically Signed   By: Genevie Ann M.D.   On: 09/10/2020 08:55

## 2020-09-11 NOTE — Progress Notes (Addendum)
PROGRESS NOTE    IMAGINE NEST  GYJ:[id number] DOB: [birth year] DOA: 09/09/2020 PCP: Patient, No Pcp Per   Brief Narrative:   Patient is a 70 y.o.femalewith medical history significant forrheumatoid arthritis,microcytic anemia, and recent treatment of UTI. Patient presented to the ED for mouth pain and dysuria.  Patient was on methotrexate for RA with a period of discontinuing the medication with restarting the medication ~2 weeks ago.  Patient came to [facility] on 09/05/20 with mouth sores, dysuria, vulvar swelling and sores, and and a back rash.  She was started on Macrobid for empiric treatment of a UTI with her methotrexate held.  Patient reports ongoing dysuria, with urine culture positive for ESBL.She denies any shortness of breath, cough, flank pain, chest pain, headache, or focal numbness or weakness.  [facility] ED Course:   In the ED patient was febrile (38.7 C), tachycardic (HR 130's) with a QTc 521 ms, and stable blood pressure, Chest X-ray negative for an acute disease process.  Notable labs include: Total bilirubin 6.9, albumin 3.0, alkaline phosphate 214, BUN 46, creatinine 1.19, bicarbonate 15, and potassium 2.7.  CBC showed pancytopenia:  WBC undetectably low, ANC 0, ALC 200, hemoglobin 10.2,  platelets 49,000, and lactic acid 2.6. Blood and urine cultures were obtained.   Assessment & Plan:   Principal Problem:   Neutropenic fever (HCC) Active Problems:   Pancytopenia (HCC)   Hypokalemia   UTI due to extended-spectrum beta lactamase (ESBL) producing Escherichia coli   Hyperbilirubinemia   Renal insufficiency   Prolonged QT interval   Mucositis   Rheumatoid arthritis (HCC)    Neutropenic fever/Pancytopenia -Currently afebrile -Suspected methotrexate toxicity -Oncology consulted, recommended holding methotrexate -Continue treatment with meropenem -Folate decreased, continue with folic acid 1 mg daily -Vitamin B12, continue to trend -Blood cultures pending negative  growth at 2 days -Urine culture positive for Group B strep and S. Aureus -Hgb 10.2->7.9->7.2->8.8, transfuse below 7  -Platelets 49->22->-->5, transfuse platelets per hematology/oncology  Hyperbilirubinemia/? Esophagitis/oral lesions -GI consulted -U/S shows no biliary obstruction -Bilirubin trending down -Potentially r/t methotrexate toxicity -Per GI continue PPI and sucralfate -Odynophagia, likely r/t mucositis  UTI due to ESBL producing E. Coli ESBL UTI -1/13 Urine culture positive for ESBL/E. Coli -Continue treatment with meropenem  AKI  -Improving with IVF -Crt 1.19->0.96->0.72 -BUN 46->40->38  Hypokalemia -Replete with 3 runs of IV potassium -K 2.7->2.9->3.1 -Will replace potassium and follow BMET in am.  Rash/Mucositis/Vaginal Lesions -Likely related to methotrexate toxicity -Continue magic mouthwash, vaseline, and oral hygiene care -Apply Desitin to skin rash as needed -Advance diet as tolerated CMV IgM negative, parvovirus B19 negative,b burgdorferi Ab negative, RSV negative. Ob/Gyn consulted. Dr. Lowell Guitar discussed with [facility], Dr. Paulita Fujita, dermatology resident who agreed that patient has methotrexate toxicity.  They can see patient as consult if bed is available.  He also called [facility] for possible transfer, no beds are available at this time.  GYN consult has been recommended and has been called.  -EBV IgG elevated at 251, IgM negative, consistent with mononucleosis infection in the past.  Since IgM negative, most likely not an acute infection or cause of current symptoms.   Weight Loss -Advance diet as tolerated -Consider consult to dietitian  Rheumatoid Arthritis, chronic -Follow up with rheumatology as out-patient  Prolonged QTc -QTc 521 -trend and replete  electrolytes -repeat EKG - improved -Avoid QT prolonging meds    DVT prophylaxis:  Code Status:  Family Communication:    Status is: Inpatient   Dispo:  The patient is from:  Home              Anticipated d/c is to: Home              Anticipated d/c date is: 09/15/20              Patient currently not medically stable for discharge   Body mass index is 18.08 kg/m.  Consultants:   Oncology  GI  ID  Procedures:   Ultrasound  Antimicrobials:   Macrobid, meropenem   Subjective:  Patient was evaluated at bedside today with complaint of severe mouth pain and discomfort.  Patient also expressed anxiety due to her discomfort with mouth sores and mouth pain.  Review of Systems Otherwise negative except as per HPI, including: General: Endorses fever and unintended weight loss. Denies chills or night sweats. Resp: Denies cough, wheezing, shortness of breath. Cardiac: Denies chest pain, palpitations, orthopnea, paroxysmal nocturnal dyspnea. GI: Denies abdominal pain, nausea, vomiting, diarrhea or constipation GU: Denies dysuria, frequency, hesitancy or incontinence MS: Denies muscle aches, joint pain or swelling Neuro: Denies headache, neurologic deficits (focal weakness, numbness, tingling), abnormal gait Psych: Denies anxiety, depression, SI/HI/AVH Skin: Reports mouth sores and vaginal sores. ID: Denies sick contacts, exotic exposures, travel  Examination:  General exam: Appears anxious and uncomfortable  Respiratory system: Clear to auscultation. Respiratory effort normal. Cardiovascular system: S1 & S2 heard, RRR. No JVD, murmurs, rubs, gallops or clicks. No pedal edema. Gastrointestinal system: Abdomen is nondistended, soft and nontender. No organomegaly or masses felt. Normal bowel sounds heard. Central nervous system: Alert and oriented. No focal neurological deficits. Extremities: Symmetric 5 x 5 power. Skin: Patient has numerous mouth sores on lips, anterior tongue, and upper palate.  Mucous membrane of mouth are raw and inflamed. Genitourinary: Exam deferred. Patient endorses vaginal lesions. Psychiatry: Judgement and insight appear  normal. Mood & affect appropriate.     Objective: Vitals:   09/11/20 0021 09/11/20 0329 09/11/20 0356 09/11/20 0600  BP: 130/82 121/78 120/81 128/89  Pulse: 95 92 91 82  Resp: 16 18 20 18   Temp: 98.5 F (36.9 C) 98.3 F (36.8 C) 98.5 F (36.9 C) 98.4 F (36.9 C)  TempSrc: Oral Oral Axillary Oral  SpO2: 100% 98%  100%  Weight:      Height:        Intake/Output Summary (Last 24 hours) at 09/11/2020 1111 Last data filed at 09/11/2020 0557 Gross per 24 hour  Intake 1851.76 ml  Output -  Net 1851.76 ml   Filed Weights   09/09/20 1346  Weight: 50.8 kg     Data Reviewed:   CBC: Recent Labs  Lab 09/09/20 1354 09/10/20 0045 09/10/20 1445 09/11/20 0526  WBC <0.1* 0.1*  --  0.5*  NEUTROABS 0.0* 0.0*  --  0.0*  HGB 10.2* 7.9* 7.2* 8.8*  HCT 31.2* 24.9* 22.5* 27.9*  MCV 89.7 91.9  --  93.0  PLT 49* 22*  --  5*   Basic Metabolic Panel: Recent Labs  Lab 09/09/20 1354 09/10/20 0045 09/11/20 0526  NA 135 135 144  K 2.7* 2.9* 3.1*  CL 101 107 116*  CO2 15* 15* 16*  GLUCOSE 136* 102* 109*  BUN 46* 40* 38*  CREATININE 1.19* 0.96 0.72  CALCIUM 9.5 8.7* 8.7*  MG  --  2.2 2.3  PHOS  --   --  2.1*   GFR: Estimated Creatinine Clearance: 54 mL/min (by C-G formula based on SCr of 0.72 mg/dL). Liver Function Tests:  Recent Labs  Lab 09/09/20 1354 09/10/20 0045 09/11/20 0526  AST 22 18 56*  ALT ALKPHOS 214* 168* 131*  BILITOT 6.9* 5.8* 3.8*  PROT 8.4* 7.3 6.2*  ALBUMIN 3.0* 2.8* 2.2*   No results for input(s): LIPASE, AMYLASE in the last 168 hours. No results for input(s): AMMONIA in the last 168 hours. Coagulation Profile: Recent Labs  Lab 09/10/20 0045  INR 1.6*   Cardiac Enzymes: No results for input(s): CKTOTAL, CKMB, CKMBINDEX, TROPONINI in the last 168 hours. BNP (last 3 results) No results for input(s): PROBNP in the last 8760 hours. HbA1C: No results for input(s): HGBA1C in the last 72 hours. CBG: No results for input(s): GLUCAP in  the last 168 hours. Lipid Profile: No results for input(s): CHOL, HDL, LDLCALC, TRIG, CHOLHDL, LDLDIRECT in the last 72 hours. Thyroid Function Tests: No results for input(s): TSH, T4TOTAL, FREET4, T3FREE, THYROIDAB in the last 72 hours. Anemia Panel: Recent Labs    09/10/20 0045  VITAMINB12 233  FOLATE 1.5*   Sepsis Labs: Recent Labs  Lab 09/09/20 1354 09/09/20 1652 09/10/20 0045  LATICACIDVEN 2.6* 2.2* 1.7    Recent Results (from the past 240 hour(s))  Urine culture     Status: Abnormal   Collection Time: 09/05/20  1:21 PM   Specimen: Urine, Clean Catch  Result Value Ref Range Status   Specimen Description   Final    URINE, CLEAN CATCH Performed at [facility], [address] [place], [place] [address]    Special Requests   Final    NONE Performed at [facility], [address] [place], [place] [address]    Culture (A)  Final    >=100,000 COLONIES/mL ESCHERICHIA COLI Confirmed Extended Spectrum Beta-Lactamase Producer (ESBL).  In bloodstream infections from ESBL organisms, carbapenems are preferred over piperacillin/tazobactam. They are shown to have a lower risk of mortality.    Report Status 09/08/2020 FINAL  Final   Organism ID, Bacteria ESCHERICHIA COLI (A)  Final      Susceptibility   Escherichia coli - MIC*    AMPICILLIN >=32 RESISTANT Resistant     CEFAZOLIN >=64 RESISTANT Resistant     CEFEPIME 16 RESISTANT Resistant     CEFTRIAXONE >=64 RESISTANT Resistant     CIPROFLOXACIN >=4 RESISTANT Resistant     GENTAMICIN <=1 SENSITIVE Sensitive     IMIPENEM <=0.25 SENSITIVE Sensitive     NITROFURANTOIN <=16 SENSITIVE Sensitive     TRIMETH/SULFA >=320 RESISTANT Resistant     AMPICILLIN/SULBACTAM 4 SENSITIVE Sensitive     PIP/TAZO <=4 SENSITIVE Sensitive     * >=100,000 COLONIES/mL ESCHERICHIA COLI  Resp Panel by RT-PCR (Flu A&B, Covid) Nasopharyngeal Swab     Status: None   Collection Time: 09/09/20  2:56 PM   Specimen: Nasopharyngeal  Swab; Nasopharyngeal(NP) swabs in vial transport medium  Result Value Ref Range Status   SARS Coronavirus 2 by RT PCR NEGATIVE NEGATIVE Final    Comment: (NOTE) SARS-CoV-2 target nucleic acids are NOT DETECTED.  The SARS-CoV-2 RNA is generally detectable in upper respiratory specimens during the acute phase of infection. The lowest concentration of SARS-CoV-2 viral copies this assay can detect is 138 copies/mL. A negative result does not preclude SARS-Cov-2 infection and should not be used as the sole basis for treatment or other patient management decisions. A negative result may occur with  improper specimen collection/handling, submission of specimen other than nasopharyngeal swab, presence of viral mutation(s) within the  areas targeted by this assay, and inadequate number of viral copies(<138 copies/mL). A negative result must be combined with clinical observations, patient history, and epidemiological information. The expected result is Negative.  Fact Sheet for Patients:  BloggerCourse.com  Fact Sheet for Healthcare Providers:  SeriousBroker.it  This test is no t yet approved or cleared by the [place] FDA and  has been authorized for detection and/or diagnosis of SARS-CoV-2 by FDA under an Emergency Use Authorization (EUA). This EUA will remain  in effect (meaning this test can be used) for the duration of the COVID-19 declaration under Section 564(b)(1) of the Act, 21 U.S.C.section 360bbb-3(b)(1), unless the authorization is terminated  or revoked sooner.       Influenza A by PCR NEGATIVE NEGATIVE Final   Influenza B by PCR NEGATIVE NEGATIVE Final    Comment: (NOTE) The Xpert Xpress SARS-CoV-2/FLU/RSV plus assay is intended as an aid in the diagnosis of influenza from Nasopharyngeal swab specimens and should not be used as a sole basis for treatment. Nasal washings and aspirates are unacceptable for Xpert Xpress  SARS-CoV-2/FLU/RSV testing.  Fact Sheet for Patients: BloggerCourse.com  Fact Sheet for Healthcare Providers: SeriousBroker.it  This test is not yet approved or cleared by the [place] FDA and has been authorized for detection and/or diagnosis of SARS-CoV-2 by FDA under an Emergency Use Authorization (EUA). This EUA will remain in effect (meaning this test can be used) for the duration of the COVID-19 declaration under Section 564(b)(1) of the Act, 21 U.S.C. section 360bbb-3(b)(1), unless the authorization is terminated or revoked.  Performed at [facility], [address] [place], [place] [address]   Culture, blood (routine x 2)     Status: None (Preliminary result)   Collection Time: 09/09/20  3:30 PM   Specimen: Right Antecubital; Blood  Result Value Ref Range Status   Specimen Description   Final    RIGHT ANTECUBITAL Performed at [facility], [address] [place], [place] [address]    Special Requests   Final    BOTTLES DRAWN AEROBIC AND ANAEROBIC Blood Culture adequate volume Performed at [facility], [address] [place], [place] [address]    Culture   Final    NO GROWTH 2 DAYS Performed at [facility], [address] [place], [place] [address]    Report Status PENDING  Incomplete  Culture, blood (routine x 2)     Status: None (Preliminary result)   Collection Time: 09/09/20  3:40 PM   Specimen: Left Antecubital; Blood  Result Value Ref Range Status   Specimen Description   Final    LEFT ANTECUBITAL Performed at [facility], [address] [place], [place] [address]    Special Requests   Final    BOTTLES DRAWN AEROBIC AND ANAEROBIC Blood Culture adequate volume Performed at [facility], [address] [place], [place] [address]    Culture   Final    NO GROWTH 2 DAYS Performed at [facility], [address] [place],  [place] [address]    Report Status PENDING  Incomplete  Urine culture     Status: Abnormal (Preliminary result)   Collection Time: 09/09/20  6:25 PM   Specimen: Urine, Clean Catch  Result Value Ref Range Status   Specimen Description   Final    URINE, CLEAN CATCH Performed at [facility], [address] [place],  [place] [address]    Special Requests   Final    NONE Performed at [facility], [address] [place], [place] [address]    Culture (A)  Final    60,000 COLONIES/mL STAPHYLOCOCCUS AUREUS 10,000 COLONIES/mL GROUP B STREP(S.AGALACTIAE)ISOLATED TESTING AGAINST S. AGALACTIAE NOT ROUTINELY PERFORMED DUE TO PREDICTABILITY OF AMP/PEN/VAN SUSCEPTIBILITY. Performed at [facility], [address] [place], [place] [address]    Report Status PENDING  Incomplete  MRSA PCR Screening     Status: None   Collection Time: 09/10/20  1:13 AM   Specimen: Nasal Mucosa; Nasopharyngeal  Result Value Ref Range Status   MRSA by PCR NEGATIVE NEGATIVE Final    Comment:        The GeneXpert MRSA Assay (FDA approved for NASAL specimens only), is one component of a comprehensive MRSA colonization surveillance program. It is not intended to diagnose MRSA infection nor to guide or monitor treatment for MRSA infections. Performed at [facility], [address], [place], [place] [address]          Radiology Studies: [facility] Chest Port 1 View  Result Date: 09/09/2020 CLINICAL DATA:  Fever and tachycardia EXAM: PORTABLE CHEST 1 VIEW COMPARISON:  September 05, 2020 FINDINGS: The lungs are clear. The heart size and pulmonary vascularity are normal. No adenopathy. No bone lesions. IMPRESSION: Lungs clear.  Cardiac silhouette. Electronically Signed   By: Bretta Bang III M.D.   On: 09/09/2020 15:20   US Abdomen Limited RUQ (LIVER/GB)  Result Date: 09/10/2020 CLINICAL DATA:  70 year old female with elevated bilirubin. EXAM: ULTRASOUND ABDOMEN LIMITED  RIGHT UPPER QUADRANT COMPARISON:  None. FINDINGS: Gallbladder: No gallstones or wall thickening visualized. No sonographic Murphy sign noted by sonographer. Common bile duct: Diameter: 4 mm, normal. Liver: Liver echogenicity appears normal (image 41). No intrahepatic biliary ductal dilatation is evident. No discrete liver lesion identified. Portal vein is patent on color Doppler imaging with normal direction of blood flow towards the liver. Other: No free fluid.  Negative visible right renal upper pole. IMPRESSION: Normal right upper quadrant ultrasound. No explanation for elevated bilirubin. Electronically Signed   By: Odessa Fleming M.D.   On: 09/10/2020 08:55        Scheduled Meds: . (feeding supplement) PROSource Plus  30 mL Oral BID BM  . sodium chloride   Intravenous Once  . sodium chloride   Intravenous Once  . chlorhexidine  15 mL Mouth Rinse BID  . feeding supplement  237 mL Oral BID BM  . folic acid  1 mg Intravenous Daily  . magic mouthwash w/lidocaine  10 mL Oral TID  . mouth rinse  15 mL Mouth Rinse q12n4p  . pantoprazole (PROTONIX) IV  40 mg Intravenous Q12H  . sucralfate  1 g Oral TID WC & HS  . vitamin B-12  1,000 mcg Oral Daily  . white petrolatum   Topical TID   Continuous Infusions: . dextrose 5% lactated ringers with KCl 20 mEq/L 100 mL/hr at 09/11/20 0940  . meropenem (MERREM) IV       LOS: 2 days   Time spent= 20 mins   Shearon Balo, RN, NP Student   If 7PM-7AM, please contact night-coverage  09/11/2020, 11:11 AM

## 2020-09-11 NOTE — Evaluation (Signed)
Clinical/Bedside Swallow Evaluation Patient Details  Name: Kim Ponce MRN: [id number] Date of Birth: 17-Sep-1950  Today's Date: 09/11/2020 Time: SLP Start Time (ACUTE ONLY): 0935 SLP Stop Time (ACUTE ONLY): 0950 SLP Time Calculation (min) (ACUTE ONLY): 15 min  Past Medical History:  Past Medical History:  Diagnosis Date  . Anemia   . Arthritis    RA and osteoarthritis  . Closed fracture of left distal radius   . Wears glasses    Past Surgical History:  Past Surgical History:  Procedure Laterality Date  . ABDOMINAL HYSTERECTOMY    . BACK SURGERY     lumbar disectomy  . CARPAL TUNNEL RELEASE Left 11/13/2013   Procedure: LEFT CARPAL TUNNEL RELEASE;  Surgeon: Nestor Lewandowsky, MD;  Location: [facility];  Service: Orthopedics;  Laterality: Left;  . JOINT REPLACEMENT    . OPEN REDUCTION INTERNAL FIXATION (ORIF) DISTAL RADIAL FRACTURE Left 04/07/2018   Procedure: OPEN REDUCTION INTERNAL FIXATION (ORIF) DISTAL RADIAL FRACTURE;  Surgeon: Betha Loa, MD;  Location: [facility];  Service: Orthopedics;  Laterality: Left;  . SHOULDER ARTHROSCOPY WITH ROTATOR CUFF REPAIR Left 11/13/2013   Procedure: LEFT SHOULDER ARTHROSCOPY WITH DISTAL CLAVICLE ACROMIOPLASTY, LABRAL AND PARTIAL ROTATOR CUFF TEAR DEBRIDEMENT, SUBACROMIAL DECOMPRESSION;  Surgeon: Nestor Lewandowsky, MD;  Location: [facility];  Service: Orthopedics;  Laterality: Left;  . TOTAL HIP ARTHROPLASTY Left 12/25/2019   Procedure: LEFT TOTAL HIP ARTHROPLASTY ANTERIOR APPROACH;  Surgeon: [name], MD;  Location: [facility];  Service: Orthopedics;  Laterality: Left;  . TOTAL KNEE ARTHROPLASTY  09/26/2012   LEFT KNEE           Dr Turner Daniels   . TOTAL KNEE ARTHROPLASTY  09/26/2012   Procedure: TOTAL KNEE ARTHROPLASTY;  Surgeon: Nestor Lewandowsky, MD;  Location: [facility];  Service: Orthopedics;  Laterality: Left;   HPI:  pt is a 70 yo female adm to [facility] with pancytopenia, mucositis, UTI, ESBL, jaundice - she has RA and  was recently given methotrexate.  Pt is s/p [place] of liver which was normal.  GI advised to deescalate diet to full liquids.  Swallow eval ordered.  Pt denies dysphagia PTA.   Assessment / Plan / Recommendation Clinical Impression  Pt presents with a severe acute oral (and potentially pharyngeal) dysphagia due to her mucositis. She reports today it has remained stable without improvement.  Upon SLP entrance to room, pt is using sponge to place magic mouthwash in mouth and tilting her head side to side for adequate transiting/coverage. She then leans forward and allows labial spillage bilaterally.  Her oral cavity appeared erythemic and mildly edematous. Mucositis is limiting muscular ROM d/t pain subsequently impairing her swallowing.    She reports she normally wears dentures - doubtful she will be able to use this during his hospitalization - thus when diet advanced - recommend to soft foods only.    Pt is managing her mucositis symptoms and no indication of aspiration during session.  Advised pt use magic mouthwash before meals and swish and expectorate with water after meals.    Reviewed dysphagia mitigation/comfort strategies with pt and provided in writing. No SLP follow up needed. SLP Visit Diagnosis: Dysphagia, oral phase (R13.11)    Aspiration Risk  Moderate aspiration risk    Diet Recommendation  (recommend continue full liquids currently and pt to determine readiness for advancement due to her awareness of her dysphagia)   Liquid Administration via: Cup (straw in future with some healing but not  currently) Supervision: Patient able to self feed Compensations: Small sips/bites;Slow rate;Other (Comment) (rinse and expectorate with water after)    Other  Recommendations Oral Care Recommendations: Oral care before and after PO   Follow up Recommendations None      Frequency and Duration   n/a         Prognosis    Good once mucositis heals     Swallow Study   General Date of  Onset: 09/11/20 HPI: pt is a 70 yo female adm to [facility] with pancytopenia, mucositis, UTI, ESBL, jaundice - she has RA and was recently given methotrexate.  Pt is s/p [place] of liver which was normal.  GI advised to deescalate diet to full liquids.  Swallow eval ordered.  Pt denies dysphagia PTA. Type of Study: Bedside Swallow Evaluation Previous Swallow Assessment: none Diet Prior to this Study: Thin liquids Temperature Spikes Noted: No Respiratory Status: Room air History of Recent Intubation: No Behavior/Cognition: Alert;Other (Comment) (uncomfortable due to her mucositis) Oral Care Completed by SLP: Other (Comment) (pt completing oral care as best as able) Oral Cavity - Dentition: Other (Comment) (has few lower teeth  - normally uses dentures) Patient Positioning: Upright in bed Baseline Vocal Quality: Low vocal intensity Volitional Cough: Other (Comment) (DNT due to pain) Volitional Swallow:  (pt reports she is having a hard time swallowing even saliva)    Oral/Motor/Sensory Function Overall Oral Motor/Sensory Function: Generalized oral weakness (limited lingual/labial ROM limited by mucositis causing pain)   Ice Chips Ice chips: Not tested   Thin Liquid Thin Liquid: Impaired Oral Phase Impairments: Reduced labial seal;Reduced lingual movement/coordination;Poor awareness of bolus Oral Phase Functional Implications: Oral holding;Right anterior spillage;Left anterior spillage    Nectar Thick Nectar Thick Liquid: Not tested   Honey Thick Honey Thick Liquid: Not tested   Puree Puree: Not tested   Solid     Solid: Not tested      Kim Ponce 09/11/2020,10:12 AM  Kim Infante, MS [facility] SLP Acute Rehab Services Office [phone] Pager [phone]

## 2020-09-12 LAB — CBC WITH DIFFERENTIAL/PLATELET
Abs Immature Granulocytes: 0 10*3/uL (ref 0.00–0.07)
Basophils Absolute: 0 10*3/uL (ref 0.0–0.1)
Basophils Relative: 0 %
Eosinophils Absolute: 0 10*3/uL (ref 0.0–0.5)
Eosinophils Relative: 2 %
HCT: 24.2 % — ABNORMAL LOW (ref 36.0–46.0)
Hemoglobin: 7.6 g/dL — ABNORMAL LOW (ref 12.0–15.0)
Immature Granulocytes: 0 %
Lymphocytes Relative: 91 %
Lymphs Abs: 0.6 10*3/uL — ABNORMAL LOW (ref 0.7–4.0)
MCH: 29.6 pg (ref 26.0–34.0)
MCHC: 31.4 g/dL (ref 30.0–36.0)
MCV: 94.2 fL (ref 80.0–100.0)
Monocytes Absolute: 0 10*3/uL — ABNORMAL LOW (ref 0.1–1.0)
Monocytes Relative: 5 %
Neutro Abs: 0 10*3/uL — CL (ref 1.7–7.7)
Neutrophils Relative %: 2 %
Platelets: 10 10*3/uL — CL (ref 150–400)
RBC: 2.57 MIL/uL — ABNORMAL LOW (ref 3.87–5.11)
RDW: 15 % (ref 11.5–15.5)
WBC: 0.7 10*3/uL — CL (ref 4.0–10.5)
nRBC: 0 % (ref 0.0–0.2)

## 2020-09-12 LAB — URINE CULTURE: Culture: 60000 — AB

## 2020-09-12 LAB — BPAM RBC
Blood Product Expiration Date: 202202012359
ISSUE DATE / TIME: 202201190329
Unit Type and Rh: 6200

## 2020-09-12 LAB — COMPREHENSIVE METABOLIC PANEL
ALT: 35 U/L (ref 0–44)
AST: 49 U/L — ABNORMAL HIGH (ref 15–41)
Albumin: 2.1 g/dL — ABNORMAL LOW (ref 3.5–5.0)
Alkaline Phosphatase: 133 U/L — ABNORMAL HIGH (ref 38–126)
Anion gap: 10 (ref 5–15)
BUN: 38 mg/dL — ABNORMAL HIGH (ref 8–23)
CO2: 18 mmol/L — ABNORMAL LOW (ref 22–32)
Calcium: 8.4 mg/dL — ABNORMAL LOW (ref 8.9–10.3)
Chloride: 121 mmol/L — ABNORMAL HIGH (ref 98–111)
Creatinine, Ser: 0.64 mg/dL (ref 0.44–1.00)
GFR, Estimated: >60 mL/min (ref >60)
Glucose, Bld: 104 mg/dL — ABNORMAL HIGH (ref 70–99)
Potassium: 3.3 mmol/L — ABNORMAL LOW (ref 3.5–5.1)
Sodium: 149 mmol/L — ABNORMAL HIGH (ref 135–145)
Total Bilirubin: 4 mg/dL — ABNORMAL HIGH (ref 0.3–1.2)
Total Protein: 5.9 g/dL — ABNORMAL LOW (ref 6.5–8.1)

## 2020-09-12 LAB — TYPE AND SCREEN
ABO/RH(D): A POS
Antibody Screen: NEGATIVE
Unit division: 0

## 2020-09-12 LAB — PREPARE PLATELET PHERESIS: Unit division: 0

## 2020-09-12 LAB — METHYLMALONIC ACID, SERUM: Methylmalonic Acid, Quantitative: 181 nmol/L (ref 0–378)

## 2020-09-12 LAB — BPAM PLATELET PHERESIS
Blood Product Expiration Date: 202201192359
ISSUE DATE / TIME: 202201191718
Unit Type and Rh: 5100

## 2020-09-12 MED ORDER — POTASSIUM PHOSPHATES 15 MMOLE/5ML IV SOLN
15.0000 mmol | Freq: Once | INTRAVENOUS | Status: AC
  Administered 2020-09-12: 15 mmol via INTRAVENOUS
  Filled 2020-09-12: qty 5

## 2020-09-12 MED ORDER — CEFAZOLIN SODIUM-DEXTROSE 1-4 GM/50ML-% IV SOLN
1.0000 g | Freq: Three times a day (TID) | INTRAVENOUS | Status: AC
  Administered 2020-09-12 – 2020-09-15 (×7): 1 g via INTRAVENOUS
  Filled 2020-09-12 (×8): qty 50

## 2020-09-12 MED ORDER — DIPHENHYDRAMINE HCL 50 MG/ML IJ SOLN
12.5000 mg | Freq: Once | INTRAMUSCULAR | Status: AC
  Administered 2020-09-12: 12.5 mg via INTRAVENOUS
  Filled 2020-09-12: qty 1

## 2020-09-12 MED ORDER — SODIUM CHLORIDE 0.9% IV SOLUTION: Freq: Once | INTRAVENOUS | Status: DC

## 2020-09-12 NOTE — Progress Notes (Addendum)
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 No evidence to suggest an underlying bone marrow disorder -we do not plan to perform a bone marrow biopsy I have reviewed her hospitalization with the rheumatologist office and advised against resumption of methotrexate in the near future Folate level was significantly low and she was started on folic acid 1 mg daily Recommend supportive care with transfusion support if hemoglobin is less than 7 or platelets are less than 10,000 She received 1 unit PRBCs on 09/10/2020 She received 1 unit of platelets on 09/11/2020 Given her severe thrombocytopenia and ongoing bleeding, I recommend repeat platelet transfusion today We discussed some of the risks, benefits, and alternatives of platelets transfusions. The patient is symptomatic from low platelet counts with bruising/bleeding/at high risk of life-threatening bleeding and the platelet count is critically low.  Some of the side-effects to be expected including risks of transfusion reactions, chills, infection, syndrome of volume overload and risk of hospitalization from various reasons and the patient is willing to proceed   Anticipate this will take several weeks for bone marrow recovery WBC is sligthly better since admission Follow CBC daily No need for G-CSF support unless she develops septic shock   Hyperbilirubinemia Unclear etiology, but most likely due to methotrexate toxicity Monitor, slowly improving   Urinary tract infection The patient has UTI symptoms Follow-up on urine culture Antibiotics per hospitalist; I recommend narrowing down the choice of antibiotics as soon as cultures are negative   Severe mucositis, slowly improving Due to methotrexate toxicity Continue supportive care with Magic  mouthwash and pain medication   AKI, resolved Due to dehydration Creatinine is improving with IV fluids Continue IV fluids per hospitalist   Discharge planning She will likely be here for 3 to 5 days  All questions were answered. The patient knows to call the clinic with any problems, questions or concerns.    Mikey Bussing, NP 09/12/2020 10:18 AM Heath Lark, MD  Subjective:  She is seen for further follow-up regarding pancytopenia due to methotrexate toxicity She still has some ongoing bleeding from her mouth and noticed a small nosebleed earlier today She has difficulties eating because of pain She has been afebrile over 24 hours Overall, a bit better than yesterday  Objective:  Vitals:   09/11/20 2035 09/12/20 0624  BP: 137/83 (!) 153/85  Pulse: 88 71  Resp: 18 20  Temp: 97.6 F (36.4 C) (!) 97.4 F (36.3 C)  SpO2: 100% 100%     Intake/Output Summary (Last 24 hours) at 09/12/2020 1018 Last data filed at 09/12/2020 0304 Gross per 24 hour  Intake 1903.38 ml  Output --  Net 1903.38 ml    GENERAL:alert, no distress and comfortable SKIN: Noted skin bruising EYES: normal, Conjunctiva are pink and non-injected, sclera clear OROPHARYNX: Persistent mucositis with dried blood in her mouth NEURO: alert & oriented x 3 with fluent speech, no focal motor/sensory deficits   Labs:  Recent Labs    12/21/19 1001 12/26/19 0321 09/09/20 1354 09/10/20 0045 09/11/20 0526 09/12/20 0550  NA 139 137   < > 135 144 149*  K 4.2 4.6   < > 2.9* 3.1* 3.3*  CL 109 109   < > 107 116* 121*  CO2 23 25   < > 15* 16* 18*  GLUCOSE 102* 158*   < >  102* 109* 104*  BUN 19 13   < > 40* 38* 38*  CREATININE 0.67 0.64   < > 0.96 0.72 0.64  CALCIUM 9.1 8.5*   < > 8.7* 8.7* 8.4*  GFRNONAA >60 >60   < > >60 >60 >60  GFRAA >60 >60  --   --   --   --   PROT  --   --    < > 7.3 6.2* 5.9*  ALBUMIN  --   --    < > 2.8* 2.2* 2.1*  AST  --   --    < > 18 56* 49*  ALT  --   --    < > 13 25 35   ALKPHOS  --   --    < > 168* 131* 133*  BILITOT  --   --    < > 5.8* 3.8* 4.0*  BILIDIR  --   --   --  3.7*  --   --    < > = values in this interval not displayed.    Studies:  DG Chest Port 1 View  Result Date: 09/09/2020 CLINICAL DATA:  Fever and tachycardia EXAM: PORTABLE CHEST 1 VIEW COMPARISON:  September 05, 2020 FINDINGS: The lungs are clear. The heart size and pulmonary vascularity are normal. No adenopathy. No bone lesions. IMPRESSION: Lungs clear.  Cardiac silhouette. Electronically Signed   By: Lowella Grip III M.D.   On: 09/09/2020 15:20   DG Chest Portable 1 View  Result Date: 09/05/2020 CLINICAL DATA:  Shortness of breath. EXAM: PORTABLE CHEST 1 VIEW COMPARISON:  December 21, 2019. FINDINGS: The heart size and mediastinal contours are within normal limits. Both lungs are clear. No visible pleural effusions or pneumothorax. No acute osseous abnormality. IMPRESSION: No active disease. Electronically Signed   By: Margaretha Sheffield MD   On: 09/05/2020 16:29   US Abdomen Limited RUQ (LIVER/GB)  Result Date: 09/10/2020 CLINICAL DATA:  70 year old female with elevated bilirubin. EXAM: ULTRASOUND ABDOMEN LIMITED RIGHT UPPER QUADRANT COMPARISON:  None. FINDINGS: Gallbladder: No gallstones or wall thickening visualized. No sonographic Murphy sign noted by sonographer. Common bile duct: Diameter: 4 mm, normal. Liver: Liver echogenicity appears normal (image 41). No intrahepatic biliary ductal dilatation is evident. No discrete liver lesion identified. Portal vein is patent on color Doppler imaging with normal direction of blood flow towards the liver. Other: No free fluid.  Negative visible right renal upper pole. IMPRESSION: Normal right upper quadrant ultrasound. No explanation for elevated bilirubin. Electronically Signed   By: Genevie Ann M.D.   On: 09/10/2020 08:55

## 2020-09-12 NOTE — Plan of Care (Signed)

## 2020-09-12 NOTE — Progress Notes (Signed)
PROGRESS NOTE    Kim Ponce  WUJ:[id number] DOB: [birth year] DOA: 09/09/2020 PCP: Patient, No Pcp Per   Brief Narrative:   Patient is a 70 y.o.femalewith medical history significant forrheumatoid arthritis,microcytic anemia, and recent treatment of UTI. Patient presented to the ED for mouth pain and dysuria.  Patient was on methotrexate for RA with a period of discontinuing the medication with restarting the medication ~2 weeks ago.  Patient came to [facility] on 09/05/20 with mouth sores, dysuria, vulvar swelling and sores, and and a back rash.  She was started on Macrobid for empiric treatment of a UTI with her methotrexate held.  Patient reports ongoing dysuria, with urine culture positive for ESBL.She denies any shortness of breath, cough, flank pain, chest pain, headache, or focal numbness or weakness.  [facility] ED Course:   In the ED patient was febrile (38.7 C), tachycardic (HR 130's) with a QTc 521 ms, and stable blood pressure, Chest X-ray negative for an acute disease process.  Notable labs include: Total bilirubin 6.9, albumin 3.0, alkaline phosphate 214, BUN 46, creatinine 1.19, bicarbonate 15, and potassium 2.7.  CBC showed pancytopenia:  WBC undetectably low, ANC 0, ALC 200, hemoglobin 10.2,  platelets 49,000, and lactic acid 2.6. Blood and urine cultures were obtained.  Assessment & Plan:   Principal Problem:   Neutropenic fever (HCC) Active Problems:   Pancytopenia (HCC)   Hypokalemia   UTI due to extended-spectrum beta lactamase (ESBL) producing Escherichia coli   Hyperbilirubinemia   Renal insufficiency   Prolonged QT interval   Mucositis   Rheumatoid arthritis (HCC)   Neutropenic fever/Pancytopenia -Currently afebrile -Suspected methotrexate toxicity -Oncology consulted, recommended holding methotrexate -Continue treatment with meropenem -Folate decreased, continue with folic acid 1 mg daily -Vitamin B12, continue to trend -Blood cultures pending negative  growth at 2 days -Urine culture positive for Group B strep and S. Aureus -Hgb 10.2->7.9->7.2->8.8, received 1 unit PRBCs on 09/10/2020,  transfuse for Hgb below 7 -Platelets 49->22->-->5->10, received 1 unit of platelets on 09/11/2020, additional unit transfused today per hematology/oncology -Per Hem/Onc, anticipate several weeks for bone marrow recover -Trend CBC daily -G-CSF support not needed unless she develops septic shock  Hyperbilirubinemia/? Esophagitis/oral lesions -GI consulted -U/S shows no biliary obstruction -Bilirubin trending down -Potentially r/t methotrexate toxicity -Per GI continue PPI and sucralfate -Odynophagia, likely r/t mucositis, improved today   UTI due to ESBL producing E. Coli ESBL UTI -1/13 Urine culture positive for ESBL/E. Coli -Initial treatment with meropenem, started on cefazolin today based on C/S results.  AKI  -Improving with IVF -Crt 1.19->0.96->0.72->0.64 -BUN 46->40->38->38  Hypokalemia -Replete with 2 runs of IV potassium -K 2.7->2.9->3.->3.3 -Replace potassium and follow BMET in am.  Rash/Mucositis/Vaginal Lesions -Likely related to methotrexate toxicity -Continue magic mouthwash, vaseline, and oral hygiene care -Apply Desitin to skin rash as needed -Advance diet as tolerated CMV IgM negative, parvovirus B19 negative,b burgdorferi Ab negative, RSV negative. Ob/Gyn consulted. Dr. Lowell Guitar discussed with [facility], Dr. Paulita Fujita, dermatology resident who agreed that patient has methotrexate toxicity.  They can see patient as consult if bed is available.  He also called [facility] for possible transfer, no beds are available at this time.  GYN consult has been recommended and has been called.  -EBV IgG elevated at 251, IgM negative, consistent with mononucleosis infection in the past.  Since IgM negative, most likely not an acute infection or cause of current symptoms.  Weight Loss -Advance diet as tolerated -Consider consult  to dietitian  Rheumatoid  Arthritis, chronic -Follow up with rheumatology as out-patient    DVT prophylaxis:  Code Status: Full Family Communication:  No family at bedside  Status is: Inpatient   Dispo: The patient is from: Home              Anticipated d/c is to: Home              Anticipated d/c date is: 09/15/20              Patient currently is not medically stable for discharge.   Body mass index is 18.08 kg/m.  Consultants:   Oncology  GI  ID  Procedures:   Ultrasound  Antimicrobials:   Macrobid, meropenem   Subjective:  Patient was evaluated at bedside today with complaint of mouth pain and discomfort still present, but markedly improved.  Anxiety related to mouth pain also dramatically better.  Patient reports vaginal sores are greatly improved, ob/gyn has not seen patient but she reports that she has not needed to use Desitin cream.   Review of Systems Otherwise negative except as per HPI, including: General: Endorses fever and unintended weight loss. Denies fever, chills, or night sweats. Resp: Denies cough, wheezing, shortness of breath. Cardiac: Denies chest pain, palpitations, orthopnea, paroxysmal nocturnal dyspnea. GI: Denies abdominal pain, nausea, vomiting, diarrhea or constipation GU: Denies dysuria, frequency, hesitancy or incontinence MS: Denies muscle aches, joint pain or swelling Neuro: Denies headache, neurologic deficits (focal weakness, numbness, tingling), abnormal gait Psych: Denies anxiety, depression, SI/HI/AVH Skin: Reports mouth sores and vaginal sores. ID: Denies sick contacts, exotic exposures, travel  Examination:  General exam: Appears calm and comfortable. Respiratory system: Clear to auscultation. Respiratory effort normal. Cardiovascular system: S1 & S2 heard, RRR. No JVD, murmurs, rubs, gallops or clicks. No pedal edema. Gastrointestinal system: Abdomen is nondistended, soft and nontender. No organomegaly or masses  felt. Normal bowel sounds heard. Central nervous system: Alert and oriented. No focal neurological deficits. Extremities: Symmetric 5 x 5 power. Skin: Erythema and lesions on lips, anterior tongue, and upper palate are still present but dramatically less than previous day. Psychiatry: Judgement and insight appear normal. Mood & affect appropriate.     Objective: Vitals:   09/11/20 1705 09/11/20 1743 09/11/20 2035 09/12/20 0624  BP: 126/76 (!) 147/98 137/83 (!) 153/85  Pulse: 86 90 88 71  Resp: Temp: 98.2 F (36.8 C) 98.2 F (36.8 C) 97.6 F (36.4 C) (!) 97.4 F (36.3 C)  TempSrc: Axillary Axillary    SpO2: 100% 100% 100% 100%  Weight:      Height:        Intake/Output Summary (Last 24 hours) at 09/12/2020 1105 Last data filed at 09/12/2020 0304 Gross per 24 hour  Intake 1903.38 ml  Output --  Net 1903.38 ml   Filed Weights   09/09/20 1346  Weight: 50.8 kg     Data Reviewed:   CBC: Recent Labs  Lab 09/09/20 1354 09/10/20 0045 09/10/20 1445 09/11/20 0526 09/12/20 0550  WBC <0.1* 0.1*  --  0.5* 0.7*  NEUTROABS 0.0* 0.0*  --  0.0* 0.0*  HGB 10.2* 7.9* 7.2* 8.8* 7.6*  HCT 31.2* 24.9* 22.5* 27.9* 24.2*  MCV 89.7 91.9  --  93.0 94.2  PLT 49* 22*  --  5* 10*   Basic Metabolic Panel: Recent Labs  Lab 09/09/20 1354 09/10/20 0045 09/11/20 0526 09/12/20 0550  NA 135 135 144 149*  K 2.7* 2.9* 3.1* 3.3*  CL 101 107 116*  121*  CO2 15* 15* 16* 18*  GLUCOSE 136* 102* 109* 104*  BUN 46* 40* 38* 38*  CREATININE 1.19* 0.96 0.72 0.64  CALCIUM 9.5 8.7* 8.7* 8.4*  MG  --  2.2 2.3  --   PHOS  --   --  2.1*  --    GFR: Estimated Creatinine Clearance: 54 mL/min (by C-G formula based on SCr of 0.64 mg/dL). Liver Function Tests: Recent Labs  Lab 09/09/20 1354 09/10/20 0045 09/11/20 0526 09/12/20 0550  AST 22 18 56* 49*  ALT 14 13 25  35  ALKPHOS 214* 168* 131* 133*  BILITOT 6.9* 5.8* 3.8* 4.0*  PROT 8.4* 7.3 6.2* 5.9*  ALBUMIN 3.0* 2.8* 2.2* 2.1*    No results for input(s): LIPASE, AMYLASE in the last 168 hours. No results for input(s): AMMONIA in the last 168 hours. Coagulation Profile: Recent Labs  Lab 09/10/20 0045  INR 1.6*   Cardiac Enzymes: No results for input(s): CKTOTAL, CKMB, CKMBINDEX, TROPONINI in the last 168 hours. BNP (last 3 results) No results for input(s): PROBNP in the last 8760 hours. HbA1C: No results for input(s): HGBA1C in the last 72 hours. CBG: No results for input(s): GLUCAP in the last 168 hours. Lipid Profile: No results for input(s): CHOL, HDL, LDLCALC, TRIG, CHOLHDL, LDLDIRECT in the last 72 hours. Thyroid Function Tests: No results for input(s): TSH, T4TOTAL, FREET4, T3FREE, THYROIDAB in the last 72 hours. Anemia Panel: Recent Labs    09/10/20 0045  VITAMINB12 233  FOLATE 1.5*   Sepsis Labs: Recent Labs  Lab 09/09/20 1354 09/09/20 1652 09/10/20 0045  LATICACIDVEN 2.6* 2.2* 1.7    Recent Results (from the past 240 hour(s))  Urine culture     Status: Abnormal   Collection Time: 09/05/20  1:21 PM   Specimen: Urine, Clean Catch  Result Value Ref Range Status   Specimen Description   Final    URINE, CLEAN CATCH Performed at [facility], [address] [place], [place]    Special Requests   Final    NONE Performed at [facility], [address] [place], [place]    Culture (A)  Final    >=100,000 COLONIES/mL ESCHERICHIA COLI Confirmed Extended Spectrum Beta-Lactamase Producer (ESBL).  In bloodstream infections from ESBL organisms, carbapenems are preferred over piperacillin/tazobactam. They are shown to have a lower risk of mortality.    Report Status 09/08/2020 FINAL  Final   Organism ID, Bacteria ESCHERICHIA COLI (A)  Final      Susceptibility   Escherichia coli - MIC*    AMPICILLIN >=32 RESISTANT Resistant     CEFAZOLIN >=64 RESISTANT Resistant     CEFEPIME 16 RESISTANT Resistant     CEFTRIAXONE >=64 RESISTANT Resistant      CIPROFLOXACIN >=4 RESISTANT Resistant     GENTAMICIN <=1 SENSITIVE Sensitive     IMIPENEM <=0.25 SENSITIVE Sensitive     NITROFURANTOIN <=16 SENSITIVE Sensitive     TRIMETH/SULFA >=320 RESISTANT Resistant     AMPICILLIN/SULBACTAM 4 SENSITIVE Sensitive     PIP/TAZO <=4 SENSITIVE Sensitive     * >=100,000 COLONIES/mL ESCHERICHIA COLI  Resp Panel by RT-PCR (Flu A&B, Covid) Nasopharyngeal Swab     Status: None   Collection Time: 09/09/20  2:56 PM   Specimen: Nasopharyngeal Swab; Nasopharyngeal(NP) swabs in vial transport medium  Result Value Ref Range Status   SARS Coronavirus 2 by RT PCR NEGATIVE NEGATIVE Final    Comment: (NOTE) SARS-CoV-2 target nucleic acids  are NOT DETECTED.  The SARS-CoV-2 RNA is generally detectable in upper respiratory specimens during the acute phase of infection. The lowest concentration of SARS-CoV-2 viral copies this assay can detect is 138 copies/mL. A negative result does not preclude SARS-Cov-2 infection and should not be used as the sole basis for treatment or other patient management decisions. A negative result may occur with  improper specimen collection/handling, submission of specimen other than nasopharyngeal swab, presence of viral mutation(s) within the areas targeted by this assay, and inadequate number of viral copies(<138 copies/mL). A negative result must be combined with clinical observations, patient history, and epidemiological information. The expected result is Negative.  Fact Sheet for Patients:  BloggerCourse.com  Fact Sheet for Healthcare Providers:  SeriousBroker.it  This test is no t yet approved or cleared by the [place] FDA and  has been authorized for detection and/or diagnosis of SARS-CoV-2 by FDA under an Emergency Use Authorization (EUA). This EUA will remain  in effect (meaning this test can be used) for the duration of the COVID-19 declaration under Section  564(b)(1) of the Act, 21 U.S.C.section 360bbb-3(b)(1), unless the authorization is terminated  or revoked sooner.       Influenza A by PCR NEGATIVE NEGATIVE Final   Influenza B by PCR NEGATIVE NEGATIVE Final    Comment: (NOTE) The Xpert Xpress SARS-CoV-2/FLU/RSV plus assay is intended as an aid in the diagnosis of influenza from Nasopharyngeal swab specimens and should not be used as a sole basis for treatment. Nasal washings and aspirates are unacceptable for Xpert Xpress SARS-CoV-2/FLU/RSV testing.  Fact Sheet for Patients: BloggerCourse.com  Fact Sheet for Healthcare Providers: SeriousBroker.it  This test is not yet approved or cleared by the [place] FDA and has been authorized for detection and/or diagnosis of SARS-CoV-2 by FDA under an Emergency Use Authorization (EUA). This EUA will remain in effect (meaning this test can be used) for the duration of the COVID-19 declaration under Section 564(b)(1) of the Act, 21 U.S.C. section 360bbb-3(b)(1), unless the authorization is terminated or revoked.  Performed at [facility], [address] [place], [place] [address]   Culture, blood (routine x 2)     Status: None (Preliminary result)   Collection Time: 09/09/20  3:30 PM   Specimen: Right Antecubital; Blood  Result Value Ref Range Status   Specimen Description   Final    RIGHT ANTECUBITAL Performed at [facility], [address] [place], [place] [address]    Special Requests   Final    BOTTLES DRAWN AEROBIC AND ANAEROBIC Blood Culture adequate volume Performed at [facility], [address] [place], [place] [address]    Culture   Final    NO GROWTH 3 DAYS Performed at [facility], [address], [place], [place] [address]    Report Status PENDING  Incomplete  Culture, blood (routine x 2)     Status: None (Preliminary result)   Collection Time: 09/09/20  3:40 PM    Specimen: Left Antecubital; Blood  Result Value Ref Range Status   Specimen Description   Final    LEFT ANTECUBITAL Performed at [facility], [address] [place], [place] [address]    Special Requests   Final    BOTTLES DRAWN AEROBIC AND ANAEROBIC Blood Culture adequate volume Performed at [facility], [address] [place], [place] [address]    Culture   Final  NO GROWTH 3 DAYS Performed at [facility], [address] [place], [place] [address]    Report Status PENDING  Incomplete  Urine culture     Status: Abnormal   Collection Time: 09/09/20  6:25 PM   Specimen: Urine, Clean Catch  Result Value Ref Range Status   Specimen Description   Final    URINE, CLEAN CATCH Performed at [facility], [address] [place], [place] [address]    Special Requests   Final    NONE Performed at [facility], [address] [place], [place] [address]    Culture (A)  Final    60,000 COLONIES/mL STAPHYLOCOCCUS AUREUS 10,000 COLONIES/mL GROUP B STREP(S.AGALACTIAE)ISOLATED TESTING AGAINST S. AGALACTIAE NOT ROUTINELY PERFORMED DUE TO PREDICTABILITY OF AMP/PEN/VAN SUSCEPTIBILITY. Performed at [facility], [address], [place], [place] [address]    Report Status 09/12/2020 FINAL  Final   Organism ID, Bacteria STAPHYLOCOCCUS AUREUS (A)  Final      Susceptibility   Staphylococcus aureus - MIC*    CIPROFLOXACIN <=0.5 SENSITIVE Sensitive     GENTAMICIN <=0.5 SENSITIVE Sensitive     NITROFURANTOIN <=16 SENSITIVE Sensitive     OXACILLIN 0.5 SENSITIVE Sensitive     TETRACYCLINE <=1 SENSITIVE Sensitive     VANCOMYCIN 1 SENSITIVE Sensitive     TRIMETH/SULFA <=10 SENSITIVE Sensitive     CLINDAMYCIN <=0.25 SENSITIVE Sensitive     RIFAMPIN <=0.5 SENSITIVE Sensitive     Inducible Clindamycin NEGATIVE Sensitive     * 60,000 COLONIES/mL STAPHYLOCOCCUS AUREUS  MRSA PCR Screening     Status: None   Collection Time: 09/10/20  1:13 AM    Specimen: Nasal Mucosa; Nasopharyngeal  Result Value Ref Range Status   MRSA by PCR NEGATIVE NEGATIVE Final    Comment:        The GeneXpert MRSA Assay (FDA approved for NASAL specimens only), is one component of a comprehensive MRSA colonization surveillance program. It is not intended to diagnose MRSA infection nor to guide or monitor treatment for MRSA infections. Performed at [facility], [address] [place], [place] [address]          Radiology Studies: No results found.      Scheduled Meds: . (feeding supplement) PROSource Plus  30 mL Oral BID BM  . sodium chloride   Intravenous Once  . sodium chloride   Intravenous Once  . chlorhexidine  15 mL Mouth Rinse BID  . feeding supplement  237 mL Oral BID BM  . folic acid  1 mg Intravenous Daily  . liver oil-zinc oxide   Topical BID  . magic mouthwash w/lidocaine  10 mL Oral TID  . mouth rinse  15 mL Mouth Rinse q12n4p  . pantoprazole (PROTONIX) IV  40 mg Intravenous Q12H  . sucralfate  1 g Oral TID WC & HS  . vitamin B-12  1,000 mcg Oral Daily  . white petrolatum   Topical TID   Continuous Infusions: .  ceFAZolin (ANCEF) IV    . lactated ringers 100 mL/hr at 09/12/20 0304  . potassium PHOSPHATE IVPB (in mmol) 15 mmol (09/12/20 1049)     LOS: 3 days   Time spent= 20 mins  Shearon Balo, RN NP Student   If 7PM-7AM, please contact night-coverage  09/12/2020, 11:05 AM

## 2020-09-13 LAB — COMPREHENSIVE METABOLIC PANEL
ALT: 25 U/L (ref 0–44)
AST: 17 U/L (ref 15–41)
Albumin: 2 g/dL — ABNORMAL LOW (ref 3.5–5.0)
Alkaline Phosphatase: 109 U/L (ref 38–126)
Anion gap: 9 (ref 5–15)
BUN: 33 mg/dL — ABNORMAL HIGH (ref 8–23)
CO2: 21 mmol/L — ABNORMAL LOW (ref 22–32)
Calcium: 8.1 mg/dL — ABNORMAL LOW (ref 8.9–10.3)
Chloride: 121 mmol/L — ABNORMAL HIGH (ref 98–111)
Creatinine, Ser: 0.6 mg/dL (ref 0.44–1.00)
GFR, Estimated: >60 mL/min (ref >60)
Glucose, Bld: 94 mg/dL (ref 70–99)
Potassium: 3.2 mmol/L — ABNORMAL LOW (ref 3.5–5.1)
Sodium: 151 mmol/L — ABNORMAL HIGH (ref 135–145)
Total Bilirubin: 3.3 mg/dL — ABNORMAL HIGH (ref 0.3–1.2)
Total Protein: 5.6 g/dL — ABNORMAL LOW (ref 6.5–8.1)

## 2020-09-13 LAB — PREPARE PLATELET PHERESIS: Unit division: 0

## 2020-09-13 LAB — CBC WITH DIFFERENTIAL/PLATELET
Abs Immature Granulocytes: 0 10*3/uL (ref 0.00–0.07)
Basophils Absolute: 0 10*3/uL (ref 0.0–0.1)
Basophils Relative: 0 %
Eosinophils Absolute: 0 10*3/uL (ref 0.0–0.5)
Eosinophils Relative: 1 %
HCT: 23.5 % — ABNORMAL LOW (ref 36.0–46.0)
Hemoglobin: 7.1 g/dL — ABNORMAL LOW (ref 12.0–15.0)
Immature Granulocytes: 0 %
Lymphocytes Relative: 94 %
Lymphs Abs: 0.7 10*3/uL (ref 0.7–4.0)
MCH: 29.1 pg (ref 26.0–34.0)
MCHC: 30.2 g/dL (ref 30.0–36.0)
MCV: 96.3 fL (ref 80.0–100.0)
Monocytes Absolute: 0 10*3/uL — ABNORMAL LOW (ref 0.1–1.0)
Monocytes Relative: 3 %
Neutro Abs: 0 10*3/uL — CL (ref 1.7–7.7)
Neutrophils Relative %: 2 %
Platelets: 17 10*3/uL — CL (ref 150–400)
RBC: 2.44 MIL/uL — ABNORMAL LOW (ref 3.87–5.11)
RDW: 15 % (ref 11.5–15.5)
WBC: 0.7 10*3/uL — CL (ref 4.0–10.5)
nRBC: 0 % (ref 0.0–0.2)

## 2020-09-13 LAB — PREPARE RBC (CROSSMATCH)

## 2020-09-13 LAB — BPAM PLATELET PHERESIS
Blood Product Expiration Date: 202201232359
ISSUE DATE / TIME: 202201201247
Unit Type and Rh: 6200

## 2020-09-13 MED ORDER — INFLUENZA VAC A&B SA ADJ QUAD 0.5 ML IM PRSY
0.5000 mL | PREFILLED_SYRINGE | INTRAMUSCULAR | Status: DC
  Filled 2020-09-13: qty 0.5

## 2020-09-13 MED ORDER — SODIUM CHLORIDE 0.9% IV SOLUTION: Freq: Once | INTRAVENOUS | Status: AC

## 2020-09-13 MED ORDER — LORAZEPAM 2 MG/ML IJ SOLN
1.0000 mg | Freq: Once | INTRAMUSCULAR | Status: AC
  Administered 2020-09-13: 1 mg via INTRAVENOUS
  Filled 2020-09-13: qty 1

## 2020-09-13 MED ORDER — POTASSIUM CHLORIDE 10 MEQ/100ML IV SOLN
10.0000 meq | Freq: Once | INTRAVENOUS | Status: AC
  Administered 2020-09-13: 10 meq via INTRAVENOUS
  Filled 2020-09-13: qty 100

## 2020-09-13 NOTE — Progress Notes (Signed)
PROGRESS NOTE    Kim Ponce  NWG:[id number] DOB: 1951/02/21 DOA: 09/09/2020 PCP: Patient, No Pcp Per   Brief Narrative:   Patient is a69 y.o.femalewith medical history significant forrheumatoid arthritis,microcytic anemia, and recent treatment of UTI. Patient presented to the ED for mouth pain and dysuria. Patient was on methotrexate for RA with a period of discontinuing the medication with restarting the medication ~2 weeks ago. Patient came to [facility] on 09/05/20 with mouth sores, dysuria, vulvar swelling and sores,and and a back rash. She was started on Macrobid for empiric treatment of a UTI with her methotrexate held. Patient reports ongoingdysuria, with urineculturepositive for ESBL.She denies any shortness of breath, cough, flank pain, chest pain, headache, or focal numbness or weakness.  [facility] ED Course:  In the ED patient was febrile (38.7 C), tachycardic (HR 130's) with a QTc 521 ms, and stable blood pressure, Chest X-ray negative for an acute disease process. Notable labs include: Total bilirubin 6.9, albumin 3.0, alkaline phosphate 214, BUN 46, creatinine 1.19, bicarbonate 15, and potassium 2.7.  CBC showedpancytopenia:WBC undetectably low, ANC 0, ALC 200, hemoglobin 10.2, platelets 49,000, and lactic acid2.6. Blood and urine cultures wereobtained.  Assessment & Plan:   Principal Problem:   Neutropenic fever (HCC) Active Problems:   Pancytopenia (HCC)   Hypokalemia   UTI due to extended-spectrum beta lactamase (ESBL) producing Escherichia coli   Hyperbilirubinemia   Renal insufficiency   Prolonged QT interval   Mucositis   Rheumatoid arthritis (HCC)  Neutropenic fever/Pancytopenia -Currently afebrile -Suspected methotrexate toxicity -Oncology consulted, recommended holdingmethotrexate -Continue treatment with meropenem -Folatedecreased,continue with folic acid 1 mg daily -Vitamin B12, continue to trend -Bloodcultures pending negative  growth at 2 days -Urine culturepositive for Group B strep and S. Aureus -Hgb 10.2->7.9->7.2->8.8->7.2 received 1 unit PRBCs on 09/10/2020, and 1 unit PRBCs on 09/13/20 per hematology/oncology -Platelets 49->22->-->5->10->17, received 1 unit of platelets on 09/11/2020, additional unit transfused 09/12/20 per hematology/oncology -Per Hem/Onc, anticipate several weeks for bone marrow recover -Trend CBC daily -G-CSF support not needed unless she develops septic shock  Hyperbilirubinemia/? Esophagitis/oral lesions -GI consulted -U/S shows no biliary obstruction -Bilirubin trending down -Potentially r/t methotrexate toxicity -Per GI continue PPI and sucralfate -Odynophagia, likely r/t mucositis, improved today   UTI due to ESBL producing E. Coli ESBL UTI -1/13 Urine culture positive for ESBL/E. Coli, repeat urine culture grew Group B Strep and Staph Aureus - Meropenem changed to cefazolin to cover Group B Strep and Staph Aureus -Continue cefazolin  AKI -Improving with IVF -Crt 1.19->0.96->0.72->0.64->0.60 -BUN 46->40->38->38->33  Hypokalemia -Repletewith 2 runs of IV potassium on 09/12/20, replete with an addition run of IV potassium today -K 2.7->2.9->3.->3.3 -Replace potassium and follow BMET in am.  Hypoalbuminemia -Albumin 3.0->2.8->2.2->2.1->2.0 -Likely related to weight loss, chronic illness  Rash/Mucositis/Vaginal Lesions -Likely related to methotrexate toxicity -Continue magic mouthwash,vaseline, andoral hygiene care -Apply Desitin to skin rash as needed -Advance diet as tolerated CMV IgM negative, parvovirus B19negative,b burgdorferi Abnegative,RSVnegative.Ob/Gyn consulted. Dr. Lowell Guitar discussed with [facility], Dr. Aurelio Jew, dermatology resident who agreed that patient has methotrexate toxicity. They can see patient as consult if bed is available. He also called [facility] for possible transfer, no beds are available at this time. GYN consult has been  recommended and has been called.  -EBV IgGelevated at 251, IgM negative, consistent with mononucleosis infection in the past. Since IgM negative, most likely not an acute infection or cause of current symptoms.  Weight Loss -Advance diet as tolerated -Consider consult to dietitian  Rheumatoid Arthritis,  chronic -Follow up with rheumatology as out-patient    DVT prophylaxis:  Code Status: Full Family Communication:  No family at bedside  Status is: Inpatient  Dispo: The patient is from: Home              Anticipated d/c is to: Home              Anticipated d/c date is: 09/15/20              Patient currently is not medically stable for discharge.   Body mass index is 18.08 kg/m.    Consultants:   Oncology  GI  ID  Procedures:   Ultrasound  Antimicrobials:   Macrobid, meropenem   Subjective:  Patient was evaluated at bedside today with complaint of mouth pain and discomfort markedly improved. Patient reports vaginal sores are greatly improved with the use of Desitin cream.  Review of Systems Otherwise negative except as per HPI, including: General: Endorses feverandunintended weight loss.  Denies fever, chills, night sweats or unintended weight loss. Resp: Denies cough, wheezing, shortness of breath. Cardiac: Denies chest pain, palpitations, orthopnea, paroxysmal nocturnal dyspnea. GI: Denies abdominal pain, nausea, vomiting, diarrhea or constipation GU: Denies dysuria, frequency, hesitancy or incontinence MS: Denies muscle aches, joint pain or swelling Neuro: Denies headache, neurologic deficits (focal weakness, numbness, tingling), abnormal gait Psych: Denies anxiety, depression, SI/HI/AVH Skin: Reports mouth sores and vaginal sores which are still present, but have improved over the past 2 days. ID: Denies sick contacts, exotic exposures, travel  Examination:  General exam: Appears calm and comfortable  Respiratory system: Clear to  auscultation. Respiratory effort normal. Cardiovascular system: S1 & S2 heard, RRR. No JVD, murmurs, rubs, gallops or clicks. No pedal edema. Gastrointestinal system: Abdomen is nondistended, soft and nontender. No organomegaly or masses felt. Normal bowel sounds heard. Central nervous system: Alert and oriented. No focal neurological deficits. Extremities: Symmetric 5 x 5 power. Skin: Erythema and lesions on lips, anterior tongue, and upper palate are subsiding and are less pronounced. Psychiatry: Judgement and insight appear normal. Mood & affect appropriate.     Objective: Vitals:   09/12/20 1318 09/12/20 1521 09/12/20 2305 09/13/20 0555  BP: 132/82 (!) 130/95 111/80 (!) 142/91  Pulse: 77 83 84 93  Resp: Temp: 97.6 F (36.4 C) (!) 97.5 F (36.4 C) 97.7 F (36.5 C) 98 F (36.7 C)  TempSrc: Oral Oral Axillary   SpO2: 100% 100% 100% 99%  Weight:      Height:        Intake/Output Summary (Last 24 hours) at 09/13/2020 1237 Last data filed at 09/13/2020 0600 Gross per 24 hour  Intake 1784.03 ml  Output --  Net 1784.03 ml   Filed Weights   09/09/20 1346  Weight: 50.8 kg     Data Reviewed:   CBC: Recent Labs  Lab 09/09/20 1354 09/10/20 0045 09/10/20 1445 09/11/20 0526 09/12/20 0550 09/13/20 0539  WBC <0.1* 0.1*  --  0.5* 0.7* 0.7*  NEUTROABS 0.0* 0.0*  --  0.0* 0.0* 0.0*  HGB 10.2* 7.9* 7.2* 8.8* 7.6* 7.1*  HCT 31.2* 24.9* 22.5* 27.9* 24.2* 23.5*  MCV 89.7 91.9  --  93.0 94.2 96.3  PLT 49* 22*  --  5* 10* 17*   Basic Metabolic Panel: Recent Labs  Lab 09/09/20 1354 09/10/20 0045 09/11/20 0526 09/12/20 0550 09/13/20 0539  NA 135 135 144 149* 151*  K 2.7* 2.9* 3.1* 3.3* 3.2*  CL 101 107 116* 121*  121*  CO2 15* 15* 16* 18* 21*  GLUCOSE 136* 102* 109* 104* 94  BUN 46* 40* 38* 38* 33*  CREATININE 1.19* 0.96 0.72 0.64 0.60  CALCIUM 9.5 8.7* 8.7* 8.4* 8.1*  MG  --  2.2 2.3  --   --   PHOS  --   --  2.1*  --   --    GFR: Estimated Creatinine  Clearance: 54 mL/min (by C-G formula based on SCr of 0.6 mg/dL). Liver Function Tests: Recent Labs  Lab 09/09/20 1354 09/10/20 0045 09/11/20 0526 09/12/20 0550 09/13/20 0539  AST 22 18 56* 49* 17  ALT 14 13 25  35 25  ALKPHOS 214* 168* 131* 133* 109  BILITOT 6.9* 5.8* 3.8* 4.0* 3.3*  PROT 8.4* 7.3 6.2* 5.9* 5.6*  ALBUMIN 3.0* 2.8* 2.2* 2.1* 2.0*   No results for input(s): LIPASE, AMYLASE in the last 168 hours. No results for input(s): AMMONIA in the last 168 hours. Coagulation Profile: Recent Labs  Lab 09/10/20 0045  INR 1.6*   Cardiac Enzymes: No results for input(s): CKTOTAL, CKMB, CKMBINDEX, TROPONINI in the last 168 hours. BNP (last 3 results) No results for input(s): PROBNP in the last 8760 hours. HbA1C: No results for input(s): HGBA1C in the last 72 hours. CBG: No results for input(s): GLUCAP in the last 168 hours. Lipid Profile: No results for input(s): CHOL, HDL, LDLCALC, TRIG, CHOLHDL, LDLDIRECT in the last 72 hours. Thyroid Function Tests: No results for input(s): TSH, T4TOTAL, FREET4, T3FREE, THYROIDAB in the last 72 hours. Anemia Panel: No results for input(s): VITAMINB12, FOLATE, FERRITIN, TIBC, IRON, RETICCTPCT in the last 72 hours. Sepsis Labs: Recent Labs  Lab 09/09/20 1354 09/09/20 1652 09/10/20 0045  LATICACIDVEN 2.6* 2.2* 1.7    Recent Results (from the past 240 hour(s))  Urine culture     Status: Abnormal   Collection Time: 09/05/20  1:21 PM   Specimen: Urine, Clean Catch  Result Value Ref Range Status   Specimen Description   Final    URINE, CLEAN CATCH Performed at [facility], [address] [place], [place]    Special Requests   Final    NONE Performed at [facility], [address] [place], [place]    Culture (A)  Final    >=100,000 COLONIES/mL ESCHERICHIA COLI Confirmed Extended Spectrum Beta-Lactamase Producer (ESBL).  In bloodstream infections from ESBL organisms, carbapenems are  preferred over piperacillin/tazobactam. They are shown to have a lower risk of mortality.    Report Status 09/08/2020 FINAL  Final   Organism ID, Bacteria ESCHERICHIA COLI (A)  Final      Susceptibility   Escherichia coli - MIC*    AMPICILLIN >=32 RESISTANT Resistant     CEFAZOLIN >=64 RESISTANT Resistant     CEFEPIME 16 RESISTANT Resistant     CEFTRIAXONE >=64 RESISTANT Resistant     CIPROFLOXACIN >=4 RESISTANT Resistant     GENTAMICIN <=1 SENSITIVE Sensitive     IMIPENEM <=0.25 SENSITIVE Sensitive     NITROFURANTOIN <=16 SENSITIVE Sensitive     TRIMETH/SULFA >=320 RESISTANT Resistant     AMPICILLIN/SULBACTAM 4 SENSITIVE Sensitive     PIP/TAZO <=4 SENSITIVE Sensitive     * >=100,000 COLONIES/mL ESCHERICHIA COLI  Resp Panel by RT-PCR (Flu A&B, Covid) Nasopharyngeal Swab     Status: None   Collection Time: 09/09/20  2:56 PM   Specimen: Nasopharyngeal Swab; Nasopharyngeal(NP) swabs in vial transport medium  Result Value Ref Range Status  SARS Coronavirus 2 by RT PCR NEGATIVE NEGATIVE Final    Comment: (NOTE) SARS-CoV-2 target nucleic acids are NOT DETECTED.  The SARS-CoV-2 RNA is generally detectable in upper respiratory specimens during the acute phase of infection. The lowest concentration of SARS-CoV-2 viral copies this assay can detect is 138 copies/mL. A negative result does not preclude SARS-Cov-2 infection and should not be used as the sole basis for treatment or other patient management decisions. A negative result may occur with  improper specimen collection/handling, submission of specimen other than nasopharyngeal swab, presence of viral mutation(s) within the areas targeted by this assay, and inadequate number of viral copies(<138 copies/mL). A negative result must be combined with clinical observations, patient history, and epidemiological information. The expected result is Negative.  Fact Sheet for Patients:  BloggerCourse.com  Fact  Sheet for Healthcare Providers:  SeriousBroker.it  This test is no t yet approved or cleared by the [place] FDA and  has been authorized for detection and/or diagnosis of SARS-CoV-2 by FDA under an Emergency Use Authorization (EUA). This EUA will remain  in effect (meaning this test can be used) for the duration of the COVID-19 declaration under Section 564(b)(1) of the Act, 21 U.S.C.section 360bbb-3(b)(1), unless the authorization is terminated  or revoked sooner.       Influenza A by PCR NEGATIVE NEGATIVE Final   Influenza B by PCR NEGATIVE NEGATIVE Final    Comment: (NOTE) The Xpert Xpress SARS-CoV-2/FLU/RSV plus assay is intended as an aid in the diagnosis of influenza from Nasopharyngeal swab specimens and should not be used as a sole basis for treatment. Nasal washings and aspirates are unacceptable for Xpert Xpress SARS-CoV-2/FLU/RSV testing.  Fact Sheet for Patients: BloggerCourse.com  Fact Sheet for Healthcare Providers: SeriousBroker.it  This test is not yet approved or cleared by the [place] FDA and has been authorized for detection and/or diagnosis of SARS-CoV-2 by FDA under an Emergency Use Authorization (EUA). This EUA will remain in effect (meaning this test can be used) for the duration of the COVID-19 declaration under Section 564(b)(1) of the Act, 21 U.S.C. section 360bbb-3(b)(1), unless the authorization is terminated or revoked.  Performed at [facility], [address] [place], [place] [address]   Culture, blood (routine x 2)     Status: None (Preliminary result)   Collection Time: 09/09/20  3:30 PM   Specimen: Right Antecubital; Blood  Result Value Ref Range Status   Specimen Description   Final    RIGHT ANTECUBITAL Performed at [facility], [address] [place], [place] [address]    Special Requests   Final    BOTTLES DRAWN AEROBIC  AND ANAEROBIC Blood Culture adequate volume Performed at [facility], [address] [place], [place] [address]    Culture   Final    NO GROWTH 4 DAYS Performed at [facility], [address], [place], [place] [address]    Report Status PENDING  Incomplete  Culture, blood (routine x 2)     Status: None (Preliminary result)   Collection Time: 09/09/20  3:40 PM   Specimen: Left Antecubital; Blood  Result Value Ref Range Status   Specimen Description   Final    LEFT ANTECUBITAL Performed at [facility], [address] [place], [place] [address]    Special Requests   Final    BOTTLES DRAWN AEROBIC AND ANAEROBIC Blood Culture adequate volume Performed at [facility],  [address] [place], [place] [address]    Culture   Final    NO GROWTH 4 DAYS Performed at [facility], [address] [place], [place] [address]    Report Status PENDING  Incomplete  Urine culture     Status: Abnormal   Collection Time: 09/09/20  6:25 PM   Specimen: Urine, Clean Catch  Result Value Ref Range Status   Specimen Description   Final    URINE, CLEAN CATCH Performed at [facility], [address] [place], [place] [address]    Special Requests   Final    NONE Performed at [facility], [address] [place], [place] [address]    Culture (A)  Final    60,000 COLONIES/mL STAPHYLOCOCCUS AUREUS 10,000 COLONIES/mL GROUP B STREP(S.AGALACTIAE)ISOLATED TESTING AGAINST S. AGALACTIAE NOT ROUTINELY PERFORMED DUE TO PREDICTABILITY OF AMP/PEN/VAN SUSCEPTIBILITY. Performed at [facility], [address], [place], [place] [address]    Report Status 09/12/2020 FINAL  Final   Organism ID, Bacteria STAPHYLOCOCCUS AUREUS (A)  Final      Susceptibility   Staphylococcus aureus - MIC*    CIPROFLOXACIN <=0.5 SENSITIVE Sensitive     GENTAMICIN <=0.5 SENSITIVE Sensitive     NITROFURANTOIN <=16 SENSITIVE Sensitive     OXACILLIN 0.5  SENSITIVE Sensitive     TETRACYCLINE <=1 SENSITIVE Sensitive     VANCOMYCIN 1 SENSITIVE Sensitive     TRIMETH/SULFA <=10 SENSITIVE Sensitive     CLINDAMYCIN <=0.25 SENSITIVE Sensitive     RIFAMPIN <=0.5 SENSITIVE Sensitive     Inducible Clindamycin NEGATIVE Sensitive     * 60,000 COLONIES/mL STAPHYLOCOCCUS AUREUS  MRSA PCR Screening     Status: None   Collection Time: 09/10/20  1:13 AM   Specimen: Nasal Mucosa; Nasopharyngeal  Result Value Ref Range Status   MRSA by PCR NEGATIVE NEGATIVE Final    Comment:        The GeneXpert MRSA Assay (FDA approved for NASAL specimens only), is one component of a comprehensive MRSA colonization surveillance program. It is not intended to diagnose MRSA infection nor to guide or monitor treatment for MRSA infections. Performed at [facility], [address], [place], [place] [address]          Radiology Studies: No results found.      Scheduled Meds: . (feeding supplement) PROSource Plus  30 mL Oral BID BM  . sodium chloride   Intravenous Once  . sodium chloride   Intravenous Once  . chlorhexidine  15 mL Mouth Rinse BID  . feeding supplement  237 mL Oral BID BM  . folic acid  1 mg Intravenous Daily  . [START ON 09/14/2020] influenza vaccine adjuvanted  0.5 mL Intramuscular Tomorrow-1000  . liver oil-zinc oxide   Topical BID  . magic mouthwash w/lidocaine  10 mL Oral TID  . mouth rinse  15 mL Mouth Rinse q12n4p  . pantoprazole (PROTONIX) IV  40 mg Intravenous Q12H  . sucralfate  1 g Oral TID WC & HS  . vitamin B-12  1,000 mcg Oral Daily  . white petrolatum   Topical TID   Continuous Infusions: .  ceFAZolin (ANCEF) IV 1 g (09/13/20 0620)  . lactated ringers 100 mL/hr at 09/13/20 0428     LOS: 4 days   Time spent= 20 mins    Shearon Balo, RN NP-Student   If 7PM-7AM, please contact night-coverage  09/13/2020, 12:37 PM

## 2020-09-13 NOTE — Progress Notes (Signed)
Responded to IV consult and IV obtained. Notified RN that this is the last appropriate vein pt had available at this time. If pt has further access needs after this, PICC/CVC may need to be considered.

## 2020-09-13 NOTE — Plan of Care (Signed)
  Problem: Health Behavior/Discharge Planning: Goal: Ability to manage health-related needs will improve Outcome: Progressing   Problem: Clinical Measurements: Goal: Will remain free from infection Outcome: Progressing   Problem: Clinical Measurements: Goal: Diagnostic test results will improve Outcome: Progressing   Problem: Nutrition: Goal: Adequate nutrition will be maintained Outcome: Progressing   Problem: Skin Integrity: Goal: Risk for impaired skin integrity will decrease Outcome: Progressing

## 2020-09-13 NOTE — Progress Notes (Addendum)
Kim Ponce   DOB:1950/10/09   BS#:[id number]    I have seen the patient, examined her and agree with documentation as follows ASSESSMENT & PLAN:  Pancytopenia with neutropenic fever Pancytopenia likely due to methotrexate toxicity No evidence to suggest an underlying bone marrow disorder -we do not plan to perform a bone marrow biopsy I have reviewed her hospitalization with the rheumatologist office and advised against resumption of methotrexate in the near future Folate level was significantly low and she was started on folic acid 1 mg daily Recommend supportive care with transfusion support if hemoglobin is less than 7 or platelets are less than 10,000 She received 1 unit PRBCs on 09/10/2020 We will give 1 unit PRBCs today for hemoglobin of 7.1 Some of the side-effects to be expected including risks of transfusion reactions, chills, infection, syndrome of volume overload and risk of hospitalization from various reasons and the patient is willing to proceed  She received 1 unit of platelets on 09/11/2020 and on 09/12/2020   Anticipate this will take several weeks for bone marrow recovery WBC is sligthly better since admission Follow CBC daily No need for G-CSF support unless she develops septic shock   Hyperbilirubinemia Unclear etiology, but most likely due to methotrexate toxicity Monitor, slowly improving   Urinary tract infection The patient has UTI symptoms Follow-up on urine culture Antibiotics per hospitalist   Severe mucositis, slowly improving Due to methotrexate toxicity Continue supportive care with Magic mouthwash and pain medication   AKI, resolved Due to dehydration Creatinine is improving with IV fluids Continue IV fluids per hospitalist   Discharge planning She will likely be here for 3 to 5 days, hopefully to be discharged early next week  All questions were answered. The patient knows to call the clinic with any problems, questions or concerns.     Mikey Bussing, NP 09/13/2020 7:43 AM Heath Lark, MD  Subjective:  She is seen for further follow-up regarding pancytopenia due to methotrexate toxicity No bleeding form her mouth reported Remains afebrile Overall, a bit better than yesterday Trying to take in some water by syringe and eat pudding  Objective:  Vitals:   09/12/20 2305 09/13/20 0555  BP: 111/80 (!) 142/91  Pulse: 84 93  Resp: 20 18  Temp: 97.7 F (36.5 C) 98 F (36.7 C)  SpO2: 100% 99%     Intake/Output Summary (Last 24 hours) at 09/13/2020 0743 Last data filed at 09/13/2020 0600 Gross per 24 hour  Intake 2722.86 ml  Output --  Net 2722.86 ml    GENERAL:alert, no distress and comfortable SKIN: Noted skin bruising EYES: normal, Conjunctiva are pink and non-injected, sclera clear OROPHARYNX: Persistent mucositis with dried blood in her mouth NEURO: alert & oriented x 3 with fluent speech, no focal motor/sensory deficits   Labs:  Recent Labs    12/21/19 1001 12/26/19 0321 09/09/20 1354 09/10/20 0045 09/11/20 0526 09/12/20 0550 09/13/20 0539  NA 139 137   < > 135 144 149* 151*  K 4.2 4.6   < > 2.9* 3.1* 3.3* 3.2*  CL 109 109   < > 107 116* 121* 121*  CO2 23 25   < > 15* 16* 18* 21*  GLUCOSE 102* 158*   < > 102* 109* 104* 94  BUN 19 13   < > 40* 38* 38* 33*  CREATININE 0.67 0.64   < > 0.96 0.72 0.64 0.60  CALCIUM 9.1 8.5*   < > 8.7* 8.7* 8.4* 8.1*  GFRNONAA >60 >60   < > >  60 >60 >60 >60  GFRAA >60 >60  --   --   --   --   --   PROT  --   --    < > 7.3 6.2* 5.9* 5.6*  ALBUMIN  --   --    < > 2.8* 2.2* 2.1* 2.0*  AST  --   --    < > 18 56* 49* 17  ALT  --   --    < > 13 25 35 25  ALKPHOS  --   --    < > 168* 131* 133* 109  BILITOT  --   --    < > 5.8* 3.8* 4.0* 3.3*  BILIDIR  --   --   --  3.7*  --   --   --    < > = values in this interval not displayed.    Studies:  DG Chest Port 1 View  Result Date: 09/09/2020 CLINICAL DATA:  Fever and tachycardia EXAM: PORTABLE CHEST 1 VIEW  COMPARISON:  September 05, 2020 FINDINGS: The lungs are clear. The heart size and pulmonary vascularity are normal. No adenopathy. No bone lesions. IMPRESSION: Lungs clear.  Cardiac silhouette. Electronically Signed   By: Lowella Grip III M.D.   On: 09/09/2020 15:20   DG Chest Portable 1 View  Result Date: 09/05/2020 CLINICAL DATA:  Shortness of breath. EXAM: PORTABLE CHEST 1 VIEW COMPARISON:  December 21, 2019. FINDINGS: The heart size and mediastinal contours are within normal limits. Both lungs are clear. No visible pleural effusions or pneumothorax. No acute osseous abnormality. IMPRESSION: No active disease. Electronically Signed   By: Margaretha Sheffield MD   On: 09/05/2020 16:29   US Abdomen Limited RUQ (LIVER/GB)  Result Date: 09/10/2020 CLINICAL DATA:  70 year old female with elevated bilirubin. EXAM: ULTRASOUND ABDOMEN LIMITED RIGHT UPPER QUADRANT COMPARISON:  None. FINDINGS: Gallbladder: No gallstones or wall thickening visualized. No sonographic Murphy sign noted by sonographer. Common bile duct: Diameter: 4 mm, normal. Liver: Liver echogenicity appears normal (image 41). No intrahepatic biliary ductal dilatation is evident. No discrete liver lesion identified. Portal vein is patent on color Doppler imaging with normal direction of blood flow towards the liver. Other: No free fluid.  Negative visible right renal upper pole. IMPRESSION: Normal right upper quadrant ultrasound. No explanation for elevated bilirubin. Electronically Signed   By: Genevie Ann M.D.   On: 09/10/2020 08:55

## 2020-09-14 ENCOUNTER — Inpatient Hospital Stay: Payer: Self-pay

## 2020-09-14 LAB — COMPREHENSIVE METABOLIC PANEL
ALT: 23 U/L (ref 0–44)
AST: 17 U/L (ref 15–41)
Albumin: 2.4 g/dL — ABNORMAL LOW (ref 3.5–5.0)
Alkaline Phosphatase: 136 U/L — ABNORMAL HIGH (ref 38–126)
Anion gap: 13 (ref 5–15)
BUN: 31 mg/dL — ABNORMAL HIGH (ref 8–23)
CO2: 19 mmol/L — ABNORMAL LOW (ref 22–32)
Calcium: 8.5 mg/dL — ABNORMAL LOW (ref 8.9–10.3)
Chloride: 119 mmol/L — ABNORMAL HIGH (ref 98–111)
Creatinine, Ser: 0.72 mg/dL (ref 0.44–1.00)
GFR, Estimated: >60 mL/min (ref >60)
Glucose, Bld: 105 mg/dL — ABNORMAL HIGH (ref 70–99)
Potassium: 3.1 mmol/L — ABNORMAL LOW (ref 3.5–5.1)
Sodium: 151 mmol/L — ABNORMAL HIGH (ref 135–145)
Total Bilirubin: 4.9 mg/dL — ABNORMAL HIGH (ref 0.3–1.2)
Total Protein: 6.5 g/dL (ref 6.5–8.1)

## 2020-09-14 LAB — CBC WITH DIFFERENTIAL/PLATELET
Abs Immature Granulocytes: 0 10*3/uL (ref 0.00–0.07)
Basophils Absolute: 0 10*3/uL (ref 0.0–0.1)
Basophils Relative: 0 %
Eosinophils Absolute: 0 10*3/uL (ref 0.0–0.5)
Eosinophils Relative: 2 %
HCT: 31 % — ABNORMAL LOW (ref 36.0–46.0)
Hemoglobin: 9.8 g/dL — ABNORMAL LOW (ref 12.0–15.0)
Immature Granulocytes: 0 %
Lymphocytes Relative: 84 %
Lymphs Abs: 0.7 10*3/uL (ref 0.7–4.0)
MCH: 29.6 pg (ref 26.0–34.0)
MCHC: 31.6 g/dL (ref 30.0–36.0)
MCV: 93.7 fL (ref 80.0–100.0)
Monocytes Absolute: 0.1 10*3/uL (ref 0.1–1.0)
Monocytes Relative: 7 %
Neutro Abs: 0.1 10*3/uL — CL (ref 1.7–7.7)
Neutrophils Relative %: 7 %
Platelets: 14 10*3/uL — CL (ref 150–400)
RBC: 3.31 MIL/uL — ABNORMAL LOW (ref 3.87–5.11)
RDW: 15.6 % — ABNORMAL HIGH (ref 11.5–15.5)
WBC: 0.8 10*3/uL — CL (ref 4.0–10.5)
nRBC: 0 % (ref 0.0–0.2)

## 2020-09-14 LAB — CULTURE, BLOOD (ROUTINE X 2)
Culture: NO GROWTH
Culture: NO GROWTH
Special Requests: ADEQUATE
Special Requests: ADEQUATE

## 2020-09-14 MED ORDER — POTASSIUM CHLORIDE 10 MEQ/100ML IV SOLN
10.0000 meq | INTRAVENOUS | Status: AC
Start: 2020-09-14 — End: 2020-09-14
  Administered 2020-09-14 (×2): 10 meq via INTRAVENOUS
  Filled 2020-09-14 (×2): qty 100

## 2020-09-14 MED ORDER — SODIUM CHLORIDE 0.45 % IV SOLN: INTRAVENOUS | Status: AC

## 2020-09-14 MED ORDER — ENSURE MAX PROTEIN PO LIQD
11.0000 [oz_av] | Freq: Two times a day (BID) | ORAL | Status: DC
  Administered 2020-09-14: 11 [oz_av] via ORAL
  Filled 2020-09-14 (×9): qty 330

## 2020-09-14 MED ORDER — CHLORHEXIDINE GLUCONATE CLOTH 2 % EX PADS
6.0000 | MEDICATED_PAD | Freq: Every day | CUTANEOUS | Status: DC
  Administered 2020-09-14 – 2020-09-17 (×4): 6 via TOPICAL

## 2020-09-14 MED ORDER — SODIUM CHLORIDE 0.9% FLUSH
10.0000 mL | INTRAVENOUS | Status: DC | PRN
Start: 2020-09-14 — End: 2020-09-18

## 2020-09-14 MED ORDER — ALPRAZOLAM 0.5 MG PO TABS
0.5000 mg | ORAL_TABLET | Freq: Three times a day (TID) | ORAL | Status: DC | PRN
  Administered 2020-09-17 – 2020-09-18 (×2): 0.5 mg via ORAL
  Filled 2020-09-14 (×2): qty 1

## 2020-09-14 MED ORDER — ADULT MULTIVITAMIN W/MINERALS CH
1.0000 | ORAL_TABLET | Freq: Every day | ORAL | Status: DC
  Administered 2020-09-16: 1 via ORAL
  Filled 2020-09-14: qty 1

## 2020-09-14 MED ORDER — RESOURCE THICKENUP CLEAR PO POWD
ORAL | Status: DC | PRN
  Filled 2020-09-14: qty 125

## 2020-09-14 MED ORDER — POTASSIUM CHLORIDE 10 MEQ/100ML IV SOLN
10.0000 meq | Freq: Once | INTRAVENOUS | Status: AC
  Administered 2020-09-14: 10 meq via INTRAVENOUS
  Filled 2020-09-14: qty 100

## 2020-09-14 NOTE — Progress Notes (Signed)
Nutrition Follow-up  DOCUMENTATION CODES:   Underweight  INTERVENTION:   Ensure Max protein supplement BID, each supplement provides 150kcal and 30g of protein.  Magic cup TID with meals, each supplement provides 290 kcal and 9 grams of protein  Pro-Source Plus 70ml PO BID- Each supplement provides 100kcal and 15g protein   MVI daily   Pt at high refeed risk; recommend monitor potassium, magnesium and phosphorus labs daily until stable  NUTRITION DIAGNOSIS:   Increased nutrient needs related to acute illness as evidenced by estimated needs.  GOAL:   Patient will meet greater than or equal to 90% of their needs -progressing   MONITOR:   PO intake,Supplement acceptance,Diet advancement,Labs,Weight trends  REASON FOR ASSESSMENT:   Consult Assessment of nutrition requirement/status  ASSESSMENT:   70 y/o female with h/o RA, anemia and recent UTI who is admitted with pancytopenia/neutropenic fever, mucositis and UTI  RD working remotely.  Spoke with pt via phone. Pt reports that she is feeling much better today. Pt reports that she has been able to eat some broth and creamy soups along with pudding and ice cream. Pt has been unable to drink the Ensure supplements or milk as she reports that they burn her mouth. Pt reports that anything sweet, citrus or chocolate flavored will burn her mouth; pt also prefers cold things. Pt reports that she has been taking the Prosource Plus. Of note, pt with poor dentition and wears dentures at baseline but is unable to utilize them r/t mouth pain. RD discussed with pt the importance of adequate nutrition needed to preserve lean muscle. Pt would like to try vanilla Ensure Max as this has less sugar than Ensure Enlive/Plus. RD will also add Magic Cups to meal trays and add MVI to help pt meet her micronutirent needs. Pt is at high refeed risk; MD notified. No new weight since admit; will request weekly weights.   Medications reviewed and include:  folic acid, protonix, carafate, B12, NaCl @100ml /hr, cefazolin   Labs reviewed: Na 151(H), K 3.1(L), BUN 31(H) Wbc- 0.8(L), Hgb 9.8(L), Hct 31.0(L)  NUTRITION - FOCUSED PHYSICAL EXAM: Unable to perform at this time   Diet Order:   Diet Order            Diet full liquid Room service appropriate? Yes; Fluid consistency: Thin  Diet effective now                EDUCATION NEEDS:   Not appropriate for education at this time  Skin:  Skin Assessment: Reviewed RN Assessment  Last BM:  1/17  Height:   Ht Readings from Last 1 Encounters:  09/09/20 5\' 6"  (1.676 m)    Weight:   Wt Readings from Last 1 Encounters:  09/09/20 50.8 kg    BMI:  Body mass index is 18.08 kg/m.  Estimated Nutritional Needs:   Kcal:  1600-1800 kcal  Protein:  80-90 grams  Fluid:  1.5L/day  MS, RD, LDN Please refer to [facility] for RD and/or RD on-call/weekend/after hours pager

## 2020-09-14 NOTE — Progress Notes (Signed)
PROGRESS NOTE    Kim Ponce  DXI:[id number] DOB: 1951-04-03 DOA: 09/09/2020 PCP: Patient, No Pcp Per   Brief Narrative:   Patient is a69 y.o.femalewith medical history significant forrheumatoid arthritis,microcytic anemia, and recent treatment of UTI. Patient presented to the ED for mouth pain and dysuria. Patient was on methotrexate for RA with a period of discontinuing the medication with restarting the medication ~2 weeks ago. Patient came to [facility] on 09/05/20 with mouth sores, dysuria, vulvar swelling and sores,and and a back rash. She was started on Macrobid for empiric treatment of a UTI with her methotrexate held. Patient reports ongoingdysuria, with urineculturepositive for ESBL.She denies any shortness of breath, cough, flank pain, chest pain, headache, or focal numbness or weakness.  Hospital Course:  In the ED patient was febrile (38.7 C), tachycardic (HR 130's) with a QTc 521 ms, and stable blood pressure, Chest X-ray negative for an acute disease process. Notable labs include: Total bilirubin 6.9, albumin 3.0, alkaline phosphate 214, BUN 46, creatinine 1.19, bicarbonate 15, and potassium 2.7.  Initial CBC in the ED showedpancytopenia:WBC undetectably low, ANC 0, ALC 200, hemoglobin 10.2, platelets 49,000, and lactic acid2.6. Blood and urine cultures wereobtained. 09/14/20 CBC shows improvement in neutropenia with WBC's 0.8 and % neutrophils 7 %.  09/14/20 PICC line order placed due to poor vasculature, since vascular access could not be maintained.  Medical Oncology was consulted to ensure that PICC line should be placed considering patient's thrombocytopenia.  Assessment & Plan:   Principal Problem:   Neutropenic fever (HCC) Active Problems:   Pancytopenia (HCC)   Hypokalemia   UTI due to extended-spectrum beta lactamase (ESBL) producing Escherichia coli   Hyperbilirubinemia   Renal insufficiency   Prolonged QT interval   Mucositis   Rheumatoid  arthritis (HCC)  Neutropenic fever/Pancytopenia -Currently afebrile -Suspected methotrexate toxicity -Oncology consulted, recommended holdingmethotrexate -Continue treatment with meropenem -Folatedecreased,continue with folic acid 1 mg daily -Vitamin B12, continue to trend -Bloodcultures pending negative growth at 3 days -Urine culturepositive for Group B strep and S. Aureus -Hgb 10.2->7.9->7.2->8.8->7.2->9.8received1 unit PRBCs on 09/10/2020, and 1 unit PRBCs on 09/13/20 per hematology/oncology -Platelets 49->22->-->5->10->17->14,received 1 unit of platelets on 09/11/2020, additional unittransfused 09/12/20 per hematology/oncology -Per Hem/Onc, anticipateseveral weeks for bone marrow recover -WBC's <0.1-.0.1->0.5->0.7->0.7->0.8 -% neutrophils 0->0->4->2->2->7 -severe neutropenia now in the low moderate neutropenia range, ~560 neutrophils/ml -TrendCBC daily -G-CSF supportnot neededunless she develops septic shock  Hyperbilirubinemia/? Esophagitis/oral lesions -GI consulted -U/S shows no biliary obstruction -Bilirubin trending down -Potentially r/t methotrexate toxicity -Per GI continue PPI and sucralfate -Odynophagia, likely r/t mucositis, improved past couple of days, now reports more painful swallowing -Continue magic mouthwash, oral care, thickener with fluids  UTI due to ESBL producing E. Coli ESBL UTI -1/13 Urine culture positive for ESBL/E. Coli, repeat urine culture grew Group B Strep and Staph Aureus - Meropenem changed to cefazolin to cover Group B Strep and Staph Aureus -Continue cefazolin  AKI -Improving with IVF -Crt 1.19->0.96->0.72->0.64->0.60->0.72 -BUN 46->40->38->38->33->31  Hypokalemia -Repletedwith2runs of IV potassium on 09/12/20, 1runs of IV potassium on 09/13/20, and 3runs of IV potassium on 09/14/20 -K 2.7->2.9->3.->3.3->3.2->3.1 -Replace potassium and follow BMET in am.  Hypernatremia -Sodium 135->144->144->151->151 -Initiate  0.45 NaCl infusion IVF to normalize sodium levels  Hypoalbuminemia -Albumin 3.0->2.8->2.2->2.1->2.0->2.4 -Likely related to weight loss, chronic illness -Consult to dietitian  Rash/Mucositis/Vaginal Lesions -Likely related to methotrexate toxicity -Continue magic mouthwash,vaseline, andoral hygiene care -Apply Desitin to skin rash as needed -Advance diet as tolerated CMV IgM negative, parvovirus B19negative,b burgdorferi Abnegative,RSVnegative.Ob/Gyn consulted. Dr. Lowell Guitar discussed  with [facility], Dr. Aurelio Jew, dermatology resident who agreed that patient has methotrexate toxicity. They can see patient as consult if bed is available. He also called [facility] for possible transfer, no beds are available at this time. GYN consult has been recommended and has been called.  -EBV IgGelevated at 251, IgM negative, consistent with mononucleosis infection in the past. Since IgM negative, most likely not an acute infection or cause of current symptoms.  Weight Loss -Advance diet as tolerated -Dietitian consult placed.  Rheumatoid Arthritis, chronic -Follow up with rheumatology as out-patient    DVT prophylaxis: SCD's Code Status: Full Family Communication:  No family at bedside  Status is: Inpatient   Dispo: The patient is from: Home              Anticipated d/c is to: Home              Anticipated d/c date is: 09/16/20              Patient currently is not medically stable for discharge   Difficult to place patient: No  Barriers to discharge: Neutropenia and hypokalemia.   Body mass index is 18.08 kg/m.     Consultants:   Oncology  GI  ID  Procedures:   Ultrasound  Antimicrobials:   Cefazolin (Ancef)   Subjective: Patient was evaluated at bedside today with complaint ofmouth pain and discomfort reported as worse today along with increased anxiety.  Patient reports having pain with swallowing.  No current reports of issues with vaginal  sores today.  Review of Systems Otherwise negative except as per HPI, including: General: Endorses feverandunintended weight loss. Denies fever, chills, night sweats or unintended weight loss. Resp: Denies cough, wheezing, shortness of breath. Cardiac: Denies chest pain, palpitations, orthopnea, paroxysmal nocturnal dyspnea. GI: Patient has complaint of pain with swallowing. Denies abdominal pain, nausea, vomiting, diarrhea or constipation GU: Denies dysuria, frequency, hesitancy or incontinence MS: Denies muscle aches, joint pain or swelling Neuro: Denies headache, neurologic deficits (focal weakness, numbness, tingling), abnormal gait Psych: Denies anxiety, depression, SI/HI/AVH Skin: Reports mouth sores and vaginal sores.  Mouth sores have improved but patient overnight reports difficulty swallowing due to sores. Patient is currently not expressing discomfort with vaginal sores. ID: Denies sick contacts, exotic exposures, travel  Examination:  General exam: Appears calm and comfortable  Respiratory system: Clear to auscultation. Respiratory effort normal. Cardiovascular system: S1 & S2 heard, RRR. No JVD, murmurs, rubs, gallops or clicks. No pedal edema. Gastrointestinal system: Abdomen is nondistended, soft and nontender. No organomegaly or masses felt. Normal bowel sounds heard. Central nervous system: Alert and oriented. No focal neurological deficits. Extremities: Symmetric 5 x 5 power. Skin:  Erythema and lesionson lips, anterior tongue, and upper palate are subsiding and have dramatically improved. Psychiatry: Judgement and insight appear normal. Mood & affect appropriate.     Objective: Vitals:   09/13/20 1350 09/13/20 1548 09/13/20 2133 09/14/20 0621  BP: (!) 147/91 (!) 150/90 (!) 148/89 (!) 163/66  Pulse: 73 83 (!) 107 (!) 102  Resp:  17 17 17   Temp:  97.6 F (36.4 C) (!) 97.5 F (36.4 C) (!) 97.5 F (36.4 C)  TempSrc:  Oral    SpO2: (!) 83% 100% 100% 99%   Weight:      Height:        Intake/Output Summary (Last 24 hours) at 09/14/2020 1108 Last data filed at 09/14/2020 0600 Gross per 24 hour  Intake 1978.61 ml  Output --  Net 1978.61 ml   Filed Weights   09/09/20 1346  Weight: 50.8 kg     Data Reviewed:   CBC: Recent Labs  Lab 09/10/20 0045 09/10/20 1445 09/11/20 0526 09/12/20 0550 09/13/20 0539 09/14/20 0057  WBC 0.1*  --  0.5* 0.7* 0.7* 0.8*  NEUTROABS 0.0*  --  0.0* 0.0* 0.0* 0.1*  HGB 7.9* 7.2* 8.8* 7.6* 7.1* 9.8*  HCT 24.9* 22.5* 27.9* 24.2* 23.5* 31.0*  MCV 91.9  --  93.0 94.2 96.3 93.7  PLT 22*  --  5* 10* 17* 14*   Basic Metabolic Panel: Recent Labs  Lab 09/10/20 0045 09/11/20 0526 09/12/20 0550 09/13/20 0539 09/14/20 0057  NA 135 144 149* 151* 151*  K 2.9* 3.1* 3.3* 3.2* 3.1*  CL 107 116* 121* 121* 119*  CO2 15* 16* 18* 21* 19*  GLUCOSE 102* 109* 104* 94 105*  BUN 40* 38* 38* 33* 31*  CREATININE 0.96 0.72 0.64 0.60 0.72  CALCIUM 8.7* 8.7* 8.4* 8.1* 8.5*  MG 2.2 2.3  --   --   --   PHOS  --  2.1*  --   --   --    GFR: Estimated Creatinine Clearance: 54 mL/min (by C-G formula based on SCr of 0.72 mg/dL). Liver Function Tests: Recent Labs  Lab 09/10/20 0045 09/11/20 0526 09/12/20 0550 09/13/20 0539 09/14/20 0057  AST 18 56* 49* 17 17  ALT 13 25 35 25 23  ALKPHOS 168* 131* 133* 109 136*  BILITOT 5.8* 3.8* 4.0* 3.3* 4.9*  PROT 7.3 6.2* 5.9* 5.6* 6.5  ALBUMIN 2.8* 2.2* 2.1* 2.0* 2.4*   No results for input(s): LIPASE, AMYLASE in the last 168 hours. No results for input(s): AMMONIA in the last 168 hours. Coagulation Profile: Recent Labs  Lab 09/10/20 0045  INR 1.6*   Cardiac Enzymes: No results for input(s): CKTOTAL, CKMB, CKMBINDEX, TROPONINI in the last 168 hours. BNP (last 3 results) No results for input(s): PROBNP in the last 8760 hours. HbA1C: No results for input(s): HGBA1C in the last 72 hours. CBG: No results for input(s): GLUCAP in the last 168 hours. Lipid Profile: No  results for input(s): CHOL, HDL, LDLCALC, TRIG, CHOLHDL, LDLDIRECT in the last 72 hours. Thyroid Function Tests: No results for input(s): TSH, T4TOTAL, FREET4, T3FREE, THYROIDAB in the last 72 hours. Anemia Panel: No results for input(s): VITAMINB12, FOLATE, FERRITIN, TIBC, IRON, RETICCTPCT in the last 72 hours. Sepsis Labs: Recent Labs  Lab 09/09/20 1354 09/09/20 1652 09/10/20 0045  LATICACIDVEN 2.6* 2.2* 1.7    Recent Results (from the past 240 hour(s))  Urine culture     Status: Abnormal   Collection Time: 09/05/20  1:21 PM   Specimen: Urine, Clean Catch  Result Value Ref Range Status   Specimen Description   Final    URINE, CLEAN CATCH Performed at [facility], [address] [place], [place] [address]    Special Requests   Final    NONE Performed at [facility], [address] [place], [place] [address]    Culture (A)  Final    >=100,000 COLONIES/mL ESCHERICHIA COLI Confirmed Extended Spectrum Beta-Lactamase Producer (ESBL).  In bloodstream infections from ESBL organisms, carbapenems are preferred over piperacillin/tazobactam. They are shown to have a lower risk of mortality.    Report Status 09/08/2020 FINAL  Final   Organism ID, Bacteria ESCHERICHIA COLI (A)  Final      Susceptibility   Escherichia coli - MIC*    AMPICILLIN >=  32 RESISTANT Resistant     CEFAZOLIN >=64 RESISTANT Resistant     CEFEPIME 16 RESISTANT Resistant     CEFTRIAXONE >=64 RESISTANT Resistant     CIPROFLOXACIN >=4 RESISTANT Resistant     GENTAMICIN <=1 SENSITIVE Sensitive     IMIPENEM <=0.25 SENSITIVE Sensitive     NITROFURANTOIN <=16 SENSITIVE Sensitive     TRIMETH/SULFA >=320 RESISTANT Resistant     AMPICILLIN/SULBACTAM 4 SENSITIVE Sensitive     PIP/TAZO <=4 SENSITIVE Sensitive     * >=100,000 COLONIES/mL ESCHERICHIA COLI  Resp Panel by RT-PCR (Flu A&B, Covid) Nasopharyngeal Swab     Status: None   Collection Time: 09/09/20  2:56 PM   Specimen: Nasopharyngeal  Swab; Nasopharyngeal(NP) swabs in vial transport medium  Result Value Ref Range Status   SARS Coronavirus 2 by RT PCR NEGATIVE NEGATIVE Final    Comment: (NOTE) SARS-CoV-2 target nucleic acids are NOT DETECTED.  The SARS-CoV-2 RNA is generally detectable in upper respiratory specimens during the acute phase of infection. The lowest concentration of SARS-CoV-2 viral copies this assay can detect is 138 copies/mL. A negative result does not preclude SARS-Cov-2 infection and should not be used as the sole basis for treatment or other patient management decisions. A negative result may occur with  improper specimen collection/handling, submission of specimen other than nasopharyngeal swab, presence of viral mutation(s) within the areas targeted by this assay, and inadequate number of viral copies(<138 copies/mL). A negative result must be combined with clinical observations, patient history, and epidemiological information. The expected result is Negative.  Fact Sheet for Patients:  BloggerCourse.com  Fact Sheet for Healthcare Providers:  SeriousBroker.it  This test is no t yet approved or cleared by the [place] FDA and  has been authorized for detection and/or diagnosis of SARS-CoV-2 by FDA under an Emergency Use Authorization (EUA). This EUA will remain  in effect (meaning this test can be used) for the duration of the COVID-19 declaration under Section 564(b)(1) of the Act, 21 U.S.C.section 360bbb-3(b)(1), unless the authorization is terminated  or revoked sooner.       Influenza A by PCR NEGATIVE NEGATIVE Final   Influenza B by PCR NEGATIVE NEGATIVE Final    Comment: (NOTE) The Xpert Xpress SARS-CoV-2/FLU/RSV plus assay is intended as an aid in the diagnosis of influenza from Nasopharyngeal swab specimens and should not be used as a sole basis for treatment. Nasal washings and aspirates are unacceptable for Xpert Xpress  SARS-CoV-2/FLU/RSV testing.  Fact Sheet for Patients: BloggerCourse.com  Fact Sheet for Healthcare Providers: SeriousBroker.it  This test is not yet approved or cleared by the [place] FDA and has been authorized for detection and/or diagnosis of SARS-CoV-2 by FDA under an Emergency Use Authorization (EUA). This EUA will remain in effect (meaning this test can be used) for the duration of the COVID-19 declaration under Section 564(b)(1) of the Act, 21 U.S.C. section 360bbb-3(b)(1), unless the authorization is terminated or revoked.  Performed at [facility], [address] [place], [place] [address]   Culture, blood (routine x 2)     Status: None (Preliminary result)   Collection Time: 09/09/20  3:30 PM   Specimen: Right Antecubital; Blood  Result Value Ref Range Status   Specimen Description   Final    RIGHT ANTECUBITAL Performed at [facility], [address] [place], [place] [address]    Special Requests   Final    BOTTLES DRAWN AEROBIC AND ANAEROBIC Blood Culture adequate volume  Performed at [facility], [address], [place], [place] [address]    Culture   Final    NO GROWTH 4 DAYS Performed at [facility], [address] [place], [place] [address]    Report Status PENDING  Incomplete  Culture, blood (routine x 2)     Status: None (Preliminary result)   Collection Time: 09/09/20  3:40 PM   Specimen: Left Antecubital; Blood  Result Value Ref Range Status   Specimen Description   Final    LEFT ANTECUBITAL Performed at [facility], [address] [place], [place] [address]    Special Requests   Final    BOTTLES DRAWN AEROBIC AND ANAEROBIC Blood Culture adequate volume Performed at [facility], [address] [place], [place] [address]    Culture   Final    NO GROWTH 4 DAYS Performed at [facility], [address] [place] [address], [place],  [place] [address]    Report Status PENDING  Incomplete  Urine culture     Status: Abnormal   Collection Time: 09/09/20  6:25 PM   Specimen: Urine, Clean Catch  Result Value Ref Range Status   Specimen Description   Final    URINE, CLEAN CATCH Performed at [facility], [address] [place], [place] [address]    Special Requests   Final    NONE Performed at [facility], [address] [place], [place] [address]    Culture (A)  Final    60,000 COLONIES/mL STAPHYLOCOCCUS AUREUS 10,000 COLONIES/mL GROUP B STREP(S.AGALACTIAE)ISOLATED TESTING AGAINST S. AGALACTIAE NOT ROUTINELY PERFORMED DUE TO PREDICTABILITY OF AMP/PEN/VAN SUSCEPTIBILITY. Performed at [facility], [address], [place], [place] [address]    Report Status 09/12/2020 FINAL  Final   Organism ID, Bacteria STAPHYLOCOCCUS AUREUS (A)  Final      Susceptibility   Staphylococcus aureus - MIC*    CIPROFLOXACIN <=0.5 SENSITIVE Sensitive     GENTAMICIN <=0.5 SENSITIVE Sensitive     NITROFURANTOIN <=16 SENSITIVE Sensitive     OXACILLIN 0.5 SENSITIVE Sensitive     TETRACYCLINE <=1 SENSITIVE Sensitive     VANCOMYCIN 1 SENSITIVE Sensitive     TRIMETH/SULFA <=10 SENSITIVE Sensitive     CLINDAMYCIN <=0.25 SENSITIVE Sensitive     RIFAMPIN <=0.5 SENSITIVE Sensitive     Inducible Clindamycin NEGATIVE Sensitive     * 60,000 COLONIES/mL STAPHYLOCOCCUS AUREUS  MRSA PCR Screening     Status: None   Collection Time: 09/10/20  1:13 AM   Specimen: Nasal Mucosa; Nasopharyngeal  Result Value Ref Range Status   MRSA by PCR NEGATIVE NEGATIVE Final    Comment:        The GeneXpert MRSA Assay (FDA approved for NASAL specimens only), is one component of a comprehensive MRSA colonization surveillance program. It is not intended to diagnose MRSA infection nor to guide or monitor treatment for MRSA infections. Performed at [facility], [address], [place], [place] [address]           Radiology Studies: [place] EKG SITE RITE  Result Date: 09/14/2020 If [facility] image not attached, placement could not be confirmed due to current cardiac rhythm.       Scheduled Meds: . (feeding supplement) PROSource Plus  30 mL Oral BID BM  . sodium chloride   Intravenous Once  . sodium chloride   Intravenous Once  . chlorhexidine  15  mL Mouth Rinse BID  . feeding supplement  237 mL Oral BID BM  . folic acid  1 mg Intravenous Daily  . influenza vaccine adjuvanted  0.5 mL Intramuscular Tomorrow-1000  . liver oil-zinc oxide   Topical BID  . magic mouthwash w/lidocaine  10 mL Oral TID  . mouth rinse  15 mL Mouth Rinse q12n4p  . pantoprazole (PROTONIX) IV  40 mg Intravenous Q12H  . sucralfate  1 g Oral TID WC & HS  . vitamin B-12  1,000 mcg Oral Daily  . white petrolatum   Topical TID   Continuous Infusions: . sodium chloride    .  ceFAZolin (ANCEF) IV Stopped (09/14/20 0904)  . potassium chloride       LOS: 5 days   Time spent= 20 mins    Shearon Balo, RN NP Student   If 7PM-7AM, please contact night-coverage  09/14/2020, 11:08 AM

## 2020-09-14 NOTE — Progress Notes (Signed)
Peripherally Inserted Central Catheter Placement  The IV Nurse has discussed with the patient and/or persons authorized to consent for the patient, the purpose of this procedure and the potential benefits and risks involved with this procedure.  The benefits include less needle sticks, lab draws from the catheter, and the patient may be discharged home with the catheter. Risks include, but not limited to, infection, bleeding, blood clot (thrombus formation), and puncture of an artery; nerve damage and irregular heartbeat and possibility to perform a PICC exchange if needed/ordered by physician.  Alternatives to this procedure were also discussed.  Bard Power PICC patient education guide, fact sheet on infection prevention and patient information card has been provided to patient /or left at bedside.    PICC Placement Documentation  PICC Double Lumen 09/14/20 PICC Right Basilic 35 cm 0 cm (Active)  Indication for Insertion or Continuance of Line Poor Vasculature-patient has had multiple peripheral attempts or PIVs lasting less than 24 hours;Limited venous access - need for IV therapy >5 days (PICC only) 09/14/20 1306  Exposed Catheter (cm) 0 cm 09/14/20 1306  Site Assessment Clean;Dry;Intact 09/14/20 1306  Lumen #1 Status Flushed;Saline locked;Blood return noted 09/14/20 1306  Lumen #2 Status Flushed;Saline locked;Blood return noted 09/14/20 1306  Dressing Type Transparent 09/14/20 1306  Dressing Status Clean;Dry;Intact 09/14/20 1306  Antimicrobial disc in place? Yes 09/14/20 1306  Safety Lock Not Applicable 09/14/20 1306  Line Care Connections checked and tightened 09/14/20 1306  Line Adjustment (NICU/IV Team Only) No 09/14/20 1306  Dressing Intervention New dressing 09/14/20 1306  Dressing Change Due 09/21/20 09/14/20 1306       Czar Ysaguirre, Lajean Manes 09/14/2020, 1:07 PM

## 2020-09-15 LAB — CBC WITH DIFFERENTIAL/PLATELET
Abs Immature Granulocytes: 0.08 10*3/uL — ABNORMAL HIGH (ref 0.00–0.07)
Basophils Absolute: 0 10*3/uL (ref 0.0–0.1)
Basophils Relative: 0 %
Eosinophils Absolute: 0 10*3/uL (ref 0.0–0.5)
Eosinophils Relative: 2 %
HCT: 21 % — ABNORMAL LOW (ref 36.0–46.0)
Hemoglobin: 6.6 g/dL — CL (ref 12.0–15.0)
Immature Granulocytes: 7 %
Lymphocytes Relative: 66 %
Lymphs Abs: 0.7 10*3/uL (ref 0.7–4.0)
MCH: 29.3 pg (ref 26.0–34.0)
MCHC: 31.4 g/dL (ref 30.0–36.0)
MCV: 93.3 fL (ref 80.0–100.0)
Monocytes Absolute: 0.1 10*3/uL (ref 0.1–1.0)
Monocytes Relative: 9 %
Neutro Abs: 0.2 10*3/uL — CL (ref 1.7–7.7)
Neutrophils Relative %: 16 %
Platelets: 8 10*3/uL — CL (ref 150–400)
RBC: 2.25 MIL/uL — ABNORMAL LOW (ref 3.87–5.11)
RDW: 15.4 % (ref 11.5–15.5)
WBC: 1.1 10*3/uL — CL (ref 4.0–10.5)
nRBC: 0 % (ref 0.0–0.2)

## 2020-09-15 LAB — COMPREHENSIVE METABOLIC PANEL
ALT: 13 U/L (ref 0–44)
AST: 12 U/L — ABNORMAL LOW (ref 15–41)
Albumin: 1.7 g/dL — ABNORMAL LOW (ref 3.5–5.0)
Alkaline Phosphatase: 105 U/L (ref 38–126)
Anion gap: 7 (ref 5–15)
BUN: 19 mg/dL (ref 8–23)
CO2: 20 mmol/L — ABNORMAL LOW (ref 22–32)
Calcium: 7.6 mg/dL — ABNORMAL LOW (ref 8.9–10.3)
Chloride: 116 mmol/L — ABNORMAL HIGH (ref 98–111)
Creatinine, Ser: 0.57 mg/dL (ref 0.44–1.00)
GFR, Estimated: >60 mL/min (ref >60)
Glucose, Bld: 97 mg/dL (ref 70–99)
Potassium: 2.6 mmol/L — CL (ref 3.5–5.1)
Sodium: 143 mmol/L (ref 135–145)
Total Bilirubin: 2.9 mg/dL — ABNORMAL HIGH (ref 0.3–1.2)
Total Protein: 4.7 g/dL — ABNORMAL LOW (ref 6.5–8.1)

## 2020-09-15 LAB — PHOSPHORUS: Phosphorus: 2.4 mg/dL — ABNORMAL LOW (ref 2.5–4.6)

## 2020-09-15 LAB — PREPARE RBC (CROSSMATCH)

## 2020-09-15 LAB — MAGNESIUM: Magnesium: 1.7 mg/dL (ref 1.7–2.4)

## 2020-09-15 MED ORDER — SODIUM CHLORIDE 0.9% IV SOLUTION: Freq: Once | INTRAVENOUS | Status: DC

## 2020-09-15 MED ORDER — POTASSIUM CHLORIDE CRYS ER 20 MEQ PO TBCR: 40.0000 meq | EXTENDED_RELEASE_TABLET | Freq: Two times a day (BID) | ORAL | Status: DC

## 2020-09-15 MED ORDER — POTASSIUM CHLORIDE 10 MEQ/100ML IV SOLN
10.0000 meq | INTRAVENOUS | Status: AC
  Administered 2020-09-15 (×4): 10 meq via INTRAVENOUS
  Filled 2020-09-15 (×4): qty 100

## 2020-09-15 MED ORDER — SODIUM CHLORIDE 0.9% IV SOLUTION: Freq: Once | INTRAVENOUS | Status: AC

## 2020-09-15 MED ORDER — MAGNESIUM SULFATE 2 GM/50ML IV SOLN
2.0000 g | Freq: Once | INTRAVENOUS | Status: AC
  Administered 2020-09-15: 2 g via INTRAVENOUS
  Filled 2020-09-15: qty 50

## 2020-09-15 NOTE — Progress Notes (Signed)
[facility]  PROGRESS NOTE  Kim Ponce ZOX:[id number] DOB: 03/19/1951 DOA: 09/09/2020 PCP: Patient, No Pcp Per   Brief HPI:   70 year old female with a history of rheumatoid arthritis, microcytic anemia, recently treated for UTI presented with mild pain and dysuria.  She has been on methotrexate for rheumatoid arthritis, developed mouth sores, dysuria vulvar swelling and rash on upper back.  Found to have methotrexate toxicity with neutropenia/pancytopenia.  Hematology was consulted.  Patient received blood and platelet transfusion.  She also is currently on cefazolin for staph aureus, group B strep UTI.  She was initially on meropenem for ESBL E. coli but repeat culture did not show ESBL E. coli.  So meropenem was switched to cefazolin.    Subjective   Patient seen and examined, oral lesions have improved.  WBC count improving.  Denies any pain.  No fever.  Platelet count is 8000 today.   Assessment/Plan:     1. Pancytopenia-secondary to methotrexate toxicity, WBC slowly improving.  Today WBC is 1.1.  Platelet count is 8000.  Patient started on folic acid 1 mg daily.  Hematology has been consulted.  Hematology does not recommend filgrastim/G-CSF at this time unless patient develops septic shock. 2. UTI-patient was started on meropenem initially for ESBL E. coli, it was switched to cefazolin after repeat urine culture grew group B strep and staph aureus.  Currently afebrile.  No symptoms. 3. Esophagitis/oral lesions-GI was consulted, patient started on PPI and sucralfate.  Odynophagia likely from mucositis.  Continue Magic mouthwash, oral care. 4. Thrombocytopenia-platelet count is 8000.  We will transfuse 1 unit platelets.  Follow CBC in a.m. 5. Anemia-hemoglobin is 6.6 today, likely dilutional.  1 unit PRBC ordered.  Follow CBC in a.m. 6. Hypernatremia-sodium was elevated at 151, improved to 143 today after starting 0.45% normal saline infusion. 7. Mucositis/vaginal  lesion-improved with Magic mouthwash, Desitin cream.  CMV IgM negative, Parvovirus B19 ANCA negative, B burgdorferi antibody negative, RSV negative.     COVID-19 Labs  No results for input(s): DDIMER, FERRITIN, LDH, CRP in the last 72 hours.  Lab Results  Component Value Date   SARSCOV2NAA NEGATIVE 09/09/2020   SARSCOV2NAA NEGATIVE 12/21/2019     Scheduled medications:   . (feeding supplement) PROSource Plus  30 mL Oral BID BM  . sodium chloride   Intravenous Once  . sodium chloride   Intravenous Once  . chlorhexidine  15 mL Mouth Rinse BID  . Chlorhexidine Gluconate Cloth  6 each Topical Daily  . folic acid  1 mg Intravenous Daily  . influenza vaccine adjuvanted  0.5 mL Intramuscular Tomorrow-1000  . liver oil-zinc oxide   Topical BID  . magic mouthwash w/lidocaine  10 mL Oral TID  . mouth rinse  15 mL Mouth Rinse q12n4p  . multivitamin with minerals  1 tablet Oral Daily  . pantoprazole (PROTONIX) IV  40 mg Intravenous Q12H  . Ensure Max Protein  11 oz Oral BID  . sucralfate  1 g Oral TID WC & HS  . vitamin B-12  1,000 mcg Oral Daily  . white petrolatum   Topical TID         CBG: No results for input(s): GLUCAP in the last 168 hours.  SpO2: 100 %    CBC: Recent Labs  Lab 09/11/20 0526 09/12/20 0550 09/13/20 0539 09/14/20 0057 09/15/20 0330  WBC 0.5* 0.7* 0.7* 0.8* 1.1*  NEUTROABS 0.0* 0.0* 0.0* 0.1* 0.2*  HGB 8.8* 7.6* 7.1* 9.8* 6.6*  HCT 27.9* 24.2* 23.5*  31.0* 21.0*  MCV 93.0 94.2 96.3 93.7 93.3  PLT 5* 10* 17* 14* 8*    Basic Metabolic Panel: Recent Labs  Lab 09/10/20 0045 09/11/20 0526 09/12/20 0550 09/13/20 0539 09/14/20 0057 09/15/20 0330  NA 135 144 149* 151* 151* 143  K 2.9* 3.1* 3.3* 3.2* 3.1* 2.6*  CL 107 116* 121* 121* 119* 116*  CO2 15* 16* 18* 21* 19* 20*  GLUCOSE 102* 109* 104* 94 105* 97  BUN 40* 38* 38* 33* 31* 19  CREATININE 0.96 0.72 0.64 0.60 0.72 0.57  CALCIUM 8.7* 8.7* 8.4* 8.1* 8.5* 7.6*  MG 2.2 2.3  --   --   --   1.7  PHOS  --  2.1*  --   --   --  2.4*     Liver Function Tests: Recent Labs  Lab 09/11/20 0526 09/12/20 0550 09/13/20 0539 09/14/20 0057 09/15/20 0330  AST 56* 49* 17 17 12*  ALT 25 35 25 23 13   ALKPHOS 131* 133* 109 136* 105  BILITOT 3.8* 4.0* 3.3* 4.9* 2.9*  PROT 6.2* 5.9* 5.6* 6.5 4.7*  ALBUMIN 2.2* 2.1* 2.0* 2.4* 1.7*     Antibiotics: Anti-infectives (From admission, onward)   Start     Dose/Rate Route Frequency Ordered Stop   09/12/20 1400  ceFAZolin (ANCEF) IVPB 1 g/50 mL premix        1 g 100 mL/hr over 30 Minutes Intravenous Every 8 hours 09/12/20 0933 09/15/20 0759   09/11/20 1400  meropenem (MERREM) 1 g in sodium chloride 0.9 % 100 mL IVPB  Status:  Discontinued        1 g 200 mL/hr over 30 Minutes Intravenous Every 8 hours 09/11/20 0728 09/12/20 0933   09/10/20 1800  meropenem (MERREM) 1 g in sodium chloride 0.9 % 100 mL IVPB  Status:  Discontinued        1 g 200 mL/hr over 30 Minutes Intravenous Every 12 hours 09/10/20 0854 09/11/20 0728   09/09/20 1545  vancomycin (VANCOCIN) 1,016 mg in sodium chloride 0.9 % 500 mL IVPB  Status:  Discontinued        20 mg/kg  50.8 kg 250 mL/hr over 120 Minutes Intravenous  Once 09/09/20 1532 09/09/20 1535   09/09/20 1545  vancomycin (VANCOCIN) IVPB 1000 mg/200 mL premix        1,000 mg 200 mL/hr over 60 Minutes Intravenous  Once 09/09/20 1535 09/09/20 1729   09/09/20 1530  meropenem (MERREM) 1 g in sodium chloride 0.9 % 100 mL IVPB  Status:  Discontinued        1 g 200 mL/hr over 30 Minutes Intravenous Every 8 hours 09/09/20 1516 09/09/20 1516   09/09/20 1515  meropenem (MERREM) 1 g in sodium chloride 0.9 % 100 mL IVPB  Status:  Discontinued        1 g 200 mL/hr over 30 Minutes Intravenous Every 8 hours 09/09/20 1504 09/10/20 0854       DVT prophylaxis: SCDs  Code Status: Full code  Family Communication: No family at bedside   Consultants:  Hematology  Procedures:      Objective   Vitals:    09/15/20 0511 09/15/20 0753 09/15/20 0827 09/15/20 1130  BP: (!) 173/82 (!) 147/74 131/72 139/87  Pulse: 92 78 77 77  Resp: 20 16 16 16   Temp: 98.1 F (36.7 C) (!) 97.5 F (36.4 C) 98.1 F (36.7 C) 97.9 F (36.6 C)  TempSrc:  Axillary Axillary Axillary  SpO2: 99% 100% 100% 100%  Weight:      Height:        Intake/Output Summary (Last 24 hours) at 09/15/2020 1410 Last data filed at 09/15/2020 1130 Gross per 24 hour  Intake 1978.51 ml  Output --  Net 1978.51 ml    01/21 1901 - 01/23 0700 In: 2114.6 [P.O.:240; I.V.:1524.6] Out: Ceasar [employer] Weights   09/09/20 1346 09/14/20 1800  Weight: 50.8 kg 57.6 kg    Physical Examination:   General-appears in no acute distress HEENT-dried crusted lesions noted on lips, mild erythema and oral cavity including hard palate. Heart-S1-S2, regular, no murmur auscultated Lungs-clear to auscultation bilaterally, no wheezing or crackles auscultated Abdomen-soft, nontender, no organomegaly Extremities-no edema in the lower extremities Neuro-alert, oriented x3, no focal deficit noted  Status is: Inpatient  Dispo: The patient is from: Home              Anticipated d/c is to: Home              Anticipated d/c date is: 09/19/2020              Patient currently not stable for discharge  Barrier to discharge-ongoing treatment for pancytopenia/neutropenia/UTI       Data Reviewed:   Recent Results (from the past 240 hour(s))  Resp Panel by RT-PCR (Flu A&B, Covid) Nasopharyngeal Swab     Status: None   Collection Time: 09/09/20  2:56 PM   Specimen: Nasopharyngeal Swab; Nasopharyngeal(NP) swabs in vial transport medium  Result Value Ref Range Status   SARS Coronavirus 2 by RT PCR NEGATIVE NEGATIVE Final    Comment: (NOTE) SARS-CoV-2 target nucleic acids are NOT DETECTED.  The SARS-CoV-2 RNA is generally detectable in upper respiratory specimens during the acute phase of infection. The lowest concentration of SARS-CoV-2 viral copies this  assay can detect is 138 copies/mL. A negative result does not preclude SARS-Cov-2 infection and should not be used as the sole basis for treatment or other patient management decisions. A negative result may occur with  improper specimen collection/handling, submission of specimen other than nasopharyngeal swab, presence of viral mutation(s) within the areas targeted by this assay, and inadequate number of viral copies(<138 copies/mL). A negative result must be combined with clinical observations, patient history, and epidemiological information. The expected result is Negative.  Fact Sheet for Patients:  BloggerCourse.com  Fact Sheet for Healthcare Providers:  SeriousBroker.it  This test is no t yet approved or cleared by the [place] FDA and  has been authorized for detection and/or diagnosis of SARS-CoV-2 by FDA under an Emergency Use Authorization (EUA). This EUA will remain  in effect (meaning this test can be used) for the duration of the COVID-19 declaration under Section 564(b)(1) of the Act, 21 U.S.C.section 360bbb-3(b)(1), unless the authorization is terminated  or revoked sooner.       Influenza A by PCR NEGATIVE NEGATIVE Final   Influenza B by PCR NEGATIVE NEGATIVE Final    Comment: (NOTE) The Xpert Xpress SARS-CoV-2/FLU/RSV plus assay is intended as an aid in the diagnosis of influenza from Nasopharyngeal swab specimens and should not be used as a sole basis for treatment. Nasal washings and aspirates are unacceptable for Xpert Xpress SARS-CoV-2/FLU/RSV testing.  Fact Sheet for Patients: BloggerCourse.com  Fact Sheet for Healthcare Providers: SeriousBroker.it  This test is not yet approved or cleared by the [place] FDA and has been authorized for detection and/or diagnosis of SARS-CoV-2 by FDA under an Emergency Use Authorization (EUA). This EUA will  remain  in effect (meaning this test can be used) for the duration of the COVID-19 declaration under Section 564(b)(1) of the Act, 21 U.S.C. section 360bbb-3(b)(1), unless the authorization is terminated or revoked.  Performed at [facility], [address] [place], [place] [address]   Culture, blood (routine x 2)     Status: None   Collection Time: 09/09/20  3:30 PM   Specimen: Right Antecubital; Blood  Result Value Ref Range Status   Specimen Description   Final    RIGHT ANTECUBITAL Performed at [facility], [address] [place], [place] [address]    Special Requests   Final    BOTTLES DRAWN AEROBIC AND ANAEROBIC Blood Culture adequate volume Performed at [facility], [address] [place], [place] [address]    Culture   Final    NO GROWTH 5 DAYS Performed at [facility], [address] [place], [place] [address]    Report Status 09/14/2020 FINAL  Final  Culture, blood (routine x 2)     Status: None   Collection Time: 09/09/20  3:40 PM   Specimen: Left Antecubital; Blood  Result Value Ref Range Status   Specimen Description   Final    LEFT ANTECUBITAL Performed at [facility], [address] [place], [place] [address]    Special Requests   Final    BOTTLES DRAWN AEROBIC AND ANAEROBIC Blood Culture adequate volume Performed at [facility], [address] [place], [place] [address]    Culture   Final    NO GROWTH 5 DAYS Performed at [facility], [address] [place], [place] [address]    Report Status 09/14/2020 FINAL  Final  Urine culture     Status: Abnormal   Collection Time: 09/09/20  6:25 PM   Specimen: Urine, Clean Catch  Result Value Ref Range Status   Specimen Description   Final    URINE, CLEAN CATCH Performed at [facility], [address] [place], [place] [address]    Special Requests   Final    NONE Performed at [facility], [address] [place], [place] [address]    Culture (A)  Final    60,000 COLONIES/mL STAPHYLOCOCCUS AUREUS 10,000 COLONIES/mL GROUP B STREP(S.AGALACTIAE)ISOLATED TESTING AGAINST S. AGALACTIAE NOT ROUTINELY PERFORMED DUE TO PREDICTABILITY OF AMP/PEN/VAN SUSCEPTIBILITY. Performed at [facility], [address] [place], [place] [address]    Report Status 09/12/2020 FINAL  Final   Organism ID, Bacteria STAPHYLOCOCCUS AUREUS (A)  Final      Susceptibility   Staphylococcus aureus - MIC*    CIPROFLOXACIN <=0.5 SENSITIVE Sensitive     GENTAMICIN <=0.5 SENSITIVE Sensitive     NITROFURANTOIN <=16 SENSITIVE Sensitive     OXACILLIN 0.5 SENSITIVE Sensitive     TETRACYCLINE <=1 SENSITIVE Sensitive     VANCOMYCIN 1 SENSITIVE Sensitive     TRIMETH/SULFA <=10 SENSITIVE Sensitive     CLINDAMYCIN <=0.25 SENSITIVE Sensitive     RIFAMPIN <=0.5 SENSITIVE Sensitive     Inducible Clindamycin NEGATIVE Sensitive     * 60,000 COLONIES/mL STAPHYLOCOCCUS AUREUS  MRSA PCR Screening     Status: None   Collection Time: 09/10/20  1:13 AM   Specimen: Nasal Mucosa; Nasopharyngeal  Result Value Ref Range Status   MRSA by PCR NEGATIVE NEGATIVE Final    Comment:  The GeneXpert MRSA Assay (FDA approved for NASAL specimens only), is one component of a comprehensive MRSA colonization surveillance program. It is not intended to diagnose MRSA infection nor to guide or monitor treatment for MRSA infections. Performed at [facility], [address] [place], [place] [address]     No results for input(s): LIPASE, AMYLASE in the last 168 hours. No results for input(s): AMMONIA in the last 168 hours.  Cardiac Enzymes: No results for input(s): CKTOTAL, CKMB, CKMBINDEX, TROPONINI in the last 168 hours. BNP (last 3 results) No results for input(s): BNP in the last 8760 hours.  ProBNP (last 3 results) No results for input(s): PROBNP in the last 8760 hours.  Studies:  [place] EKG SITE RITE  Result  Date: 09/14/2020 If [facility] image not attached, placement could not be confirmed due to current cardiac rhythm.  [place] EKG SITE RITE  Result Date: 09/14/2020 If [facility] image not attached, placement could not be confirmed due to current cardiac rhythm.      Meredeth Ide   [facility] If 7PM-7AM, please contact night-coverage at [email or web address], Office  [phone]   09/15/2020, 2:10 PM  LOS: 6 days

## 2020-09-15 NOTE — Progress Notes (Addendum)
Critical Labs Potassium 2.6 WBC 1.1 Hemoglobin 6.6 Platelets 8  Provider has been made aware

## 2020-09-16 LAB — TYPE AND SCREEN
ABO/RH(D): A POS
Antibody Screen: NEGATIVE
Unit division: 0
Unit division: 0

## 2020-09-16 LAB — CBC
HCT: 26.5 % — ABNORMAL LOW (ref 36.0–46.0)
Hemoglobin: 8.7 g/dL — ABNORMAL LOW (ref 12.0–15.0)
MCH: 29 pg (ref 26.0–34.0)
MCHC: 32.8 g/dL (ref 30.0–36.0)
MCV: 88.3 fL (ref 80.0–100.0)
Platelets: 28 10*3/uL — CL (ref 150–400)
RBC: 3 MIL/uL — ABNORMAL LOW (ref 3.87–5.11)
RDW: 15.4 % (ref 11.5–15.5)
WBC: 1.9 10*3/uL — ABNORMAL LOW (ref 4.0–10.5)
nRBC: 0 % (ref 0.0–0.2)

## 2020-09-16 LAB — BPAM RBC
Blood Product Expiration Date: 202202172359
Blood Product Expiration Date: 202202172359
ISSUE DATE / TIME: 202201211257
ISSUE DATE / TIME: 202201230804
Unit Type and Rh: 6200
Unit Type and Rh: 6200

## 2020-09-16 LAB — BASIC METABOLIC PANEL
Anion gap: 9 (ref 5–15)
BUN: 16 mg/dL (ref 8–23)
CO2: 21 mmol/L — ABNORMAL LOW (ref 22–32)
Calcium: 7.4 mg/dL — ABNORMAL LOW (ref 8.9–10.3)
Chloride: 109 mmol/L (ref 98–111)
Creatinine, Ser: 0.6 mg/dL (ref 0.44–1.00)
GFR, Estimated: >60 mL/min (ref >60)
Glucose, Bld: 91 mg/dL (ref 70–99)
Potassium: 2.6 mmol/L — CL (ref 3.5–5.1)
Sodium: 139 mmol/L (ref 135–145)

## 2020-09-16 LAB — BPAM PLATELET PHERESIS
Blood Product Expiration Date: 202201232359
ISSUE DATE / TIME: 202201231519
Unit Type and Rh: 5100

## 2020-09-16 LAB — PREPARE PLATELET PHERESIS: Unit division: 0

## 2020-09-16 MED ORDER — POTASSIUM CHLORIDE 10 MEQ/100ML IV SOLN
INTRAVENOUS | Status: AC
  Filled 2020-09-16: qty 100

## 2020-09-16 MED ORDER — POTASSIUM CHLORIDE 10 MEQ/100ML IV SOLN
10.0000 meq | INTRAVENOUS | Status: AC
  Administered 2020-09-16 (×5): 10 meq via INTRAVENOUS
  Filled 2020-09-16 (×4): qty 100

## 2020-09-16 MED ORDER — SODIUM CHLORIDE (PF) 0.9 % IJ SOLN
INTRAMUSCULAR | Status: AC
  Administered 2020-09-16: 10 mL
  Filled 2020-09-16: qty 10

## 2020-09-16 NOTE — Progress Notes (Addendum)
Kim Ponce   DOB:Oct 13, 1950   YI#:[id number]    I have seen her, examined her and agree with the documentation as follows ASSESSMENT & PLAN:  Pancytopenia with neutropenic fever Pancytopenia likely due to methotrexate toxicity No evidence to suggest an underlying bone marrow disorder -we do not plan to perform a bone marrow biopsy I have reviewed her hospitalization with the rheumatologist office and advised against resumption of methotrexate in the near future Folate level was significantly low and she was started on folic acid 1 mg daily Recommend supportive care with transfusion support if hemoglobin is less than 7 or platelets are less than 10,000 She has received a total of 3 units of PRBCs this admission on 09/11/2020, 09/13/2020, and 09/15/2020 Hemoglobin has improved to 8.7 today Will observe for now She has received a total of 3 units of platelets this admission on 09/11/2020, 09/12/2020, and 09/15/2020 Platelets 28,000 this morning and will observe for now Anticipate this will take several weeks for bone marrow recovery WBC is sligthly better since admission Follow CBC daily No need for G-CSF support unless she develops septic shock   Hyperbilirubinemia Unclear etiology, but most likely due to methotrexate toxicity Monitor, slowly improving   Urinary tract infection UTI symptoms have resolved She is now off antibiotics   Severe mucositis, slowly improving Due to methotrexate toxicity Continue supportive care with Magic mouthwash and pain medication   AKI, resolved Due to dehydration Observe for now   Discharge planning She will likely be here another 1 to 2 days I am hopeful, if her platelet count and hemoglobin stabilized tomorrow, we can consider discharge soon Even though her blood counts are better, given significant transfusion needed over the weekend, I am not comfortable to recommend discharge for now  All questions were answered. The patient knows to call the  clinic with any problems, questions or concerns.    Mikey Bussing, NP 09/16/2020 10:51 AM Heath Lark, MD  Subjective:  She is seen for further follow-up regarding pancytopenia due to methotrexate toxicity No bleeding form her mouth reported Remains afebrile Continues to slowly improve daily She is able to take in water and tea by a syringe Also taking in soft foods  Objective:  Vitals:   09/15/20 1748 09/16/20 0557  BP: 136/85 (!) 133/93  Pulse: 77 71  Resp: 15 19  Temp: 98.4 F (36.9 C) 97.8 F (36.6 C)  SpO2: 100% 99%     Intake/Output Summary (Last 24 hours) at 09/16/2020 1051 Last data filed at 09/16/2020 0525 Gross per 24 hour  Intake 3014.93 ml  Output --  Net 3014.93 ml    GENERAL:alert, no distress and comfortable SKIN: Noted skin bruising EYES: normal, Conjunctiva are pink and non-injected, sclera clear OROPHARYNX: Persistent mucositis with dried blood in her mouth which is improving NEURO: alert & oriented x 3 with fluent speech, no focal motor/sensory deficits   Labs:  Recent Labs    12/21/19 1001 12/26/19 0321 09/09/20 1354 09/10/20 0045 09/11/20 0526 09/13/20 0539 09/14/20 0057 09/15/20 0330  NA 139 137   < > 135   < > 151* 151* 143  K 4.2 4.6   < > 2.9*   < > 3.2* 3.1* 2.6*  CL 109 109   < > 107   < > 121* 119* 116*  CO2 23 25   < > 15*   < > 21* 19* 20*  GLUCOSE 102* 158*   < > 102*   < > 94 105* 97  BUN 19 13   < > 40*   < > 33* 31* 19  CREATININE 0.67 0.64   < > 0.96   < > 0.60 0.72 0.57  CALCIUM 9.1 8.5*   < > 8.7*   < > 8.1* 8.5* 7.6*  GFRNONAA >60 >60   < > >60   < > >60 >60 >60  GFRAA >60 >60  --   --   --   --   --   --   PROT  --   --    < > 7.3   < > 5.6* 6.5 4.7*  ALBUMIN  --   --    < > 2.8*   < > 2.0* 2.4* 1.7*  AST  --   --    < > 18   < > 17 17 12*  ALT  --   --    < > 13   < > '25 23 13  ' ALKPHOS  --   --    < > 168*   < > 109 136* 105  BILITOT  --   --    < > 5.8*   < > 3.3* 4.9* 2.9*  BILIDIR  --   --   --  3.7*  --   --    --   --    < > = values in this interval not displayed.    Studies:  DG Chest Port 1 View  Result Date: 09/09/2020 CLINICAL DATA:  Fever and tachycardia EXAM: PORTABLE CHEST 1 VIEW COMPARISON:  September 05, 2020 FINDINGS: The lungs are clear. The heart size and pulmonary vascularity are normal. No adenopathy. No bone lesions. IMPRESSION: Lungs clear.  Cardiac silhouette. Electronically Signed   By: Lowella Grip III M.D.   On: 09/09/2020 15:20   DG Chest Portable 1 View  Result Date: 09/05/2020 CLINICAL DATA:  Shortness of breath. EXAM: PORTABLE CHEST 1 VIEW COMPARISON:  December 21, 2019. FINDINGS: The heart size and mediastinal contours are within normal limits. Both lungs are clear. No visible pleural effusions or pneumothorax. No acute osseous abnormality. IMPRESSION: No active disease. Electronically Signed   By: Margaretha Sheffield MD   On: 09/05/2020 16:29   [place] EKG SITE RITE  Result Date: 09/14/2020 If Site Rite image not attached, placement could not be confirmed due to current cardiac rhythm.  [place] EKG SITE RITE  Result Date: 09/14/2020 If [facility] image not attached, placement could not be confirmed due to current cardiac rhythm.  US Abdomen Limited RUQ (LIVER/GB)  Result Date: 09/10/2020 CLINICAL DATA:  70 year old female with elevated bilirubin. EXAM: ULTRASOUND ABDOMEN LIMITED RIGHT UPPER QUADRANT COMPARISON:  None. FINDINGS: Gallbladder: No gallstones or wall thickening visualized. No sonographic Murphy sign noted by sonographer. Common bile duct: Diameter: 4 mm, normal. Liver: Liver echogenicity appears normal (image 41). No intrahepatic biliary ductal dilatation is evident. No discrete liver lesion identified. Portal vein is patent on color Doppler imaging with normal direction of blood flow towards the liver. Other: No free fluid.  Negative visible right renal upper pole. IMPRESSION: Normal right upper quadrant ultrasound. No explanation for elevated bilirubin. Electronically  Signed   By: Genevie Ann M.D.   On: 09/10/2020 08:55

## 2020-09-16 NOTE — Progress Notes (Addendum)
[facility]  PROGRESS NOTE  Kim Ponce:[id number] DOB: [birth year] DOA: 09/09/2020 PCP: Patient, No Pcp Per   Brief HPI:   70 year old female with a history of rheumatoid arthritis, microcytic anemia, recently treated for UTI presented with mild pain and dysuria.  She has been on methotrexate for rheumatoid arthritis, developed mouth sores, dysuria vulvar swelling and rash on upper back.  Found to have methotrexate toxicity with neutropenia/pancytopenia.  Hematology was consulted.  Patient received blood and platelet transfusion.  She also is currently on cefazolin for staph aureus, group B strep UTI.  She was initially on meropenem for ESBL E. coli but repeat culture did not show ESBL E. coli.  So meropenem was switched to cefazolin.    Subjective   Patient seen and examined, still has significant swelling of the lips from mucositis.  WBC and blood count have improved today.  Clear WBC is 1.9, platelet count 28,000.  She received 1 unit PRBC yesterday and 1 unit of platelets.   Assessment/Plan:     1. Pancytopenia-secondary to methotrexate toxicity, WBC slowly improving.  Today WBC is 1.9, platelet count 28,000.  Patient started on folic acid 1 mg daily.  Hematology has been consulted.  Hematology does not recommend filgrastim/G-CSF at this time unless patient develops septic shock. 2. UTI-patient was started on meropenem initially for ESBL E. coli, it was switched to cefazolin after repeat urine culture grew group B strep and staph aureus.  Currently afebrile.  No symptoms. 3. Hypokalemia-potassium is 2.6, replace potassium , check BMP in am. 4. Esophagitis/oral lesions-GI was consulted, patient started on PPI and sucralfate.  Odynophagia likely from mucositis.  Continue Magic mouthwash, oral care. 5. Thrombocytopenia-platelet count is 28,000, improved.  Was given 1 unit of platelets yesterday 6. Anemia-hemoglobin is 8.7, he received 1 PRBC yesterday. 7. Hypernatremia-sodium  was elevated at 151, improved to 139 today.  0.45% normal saline was discontinued.  8. Mucositis/vaginal lesion-improved with Magic mouthwash, Desitin cream.  CMV IgM negative, Parvovirus B19 ANCA negative, B burgdorferi antibody negative, RSV negative.     COVID-19 Labs  No results for input(s): DDIMER, FERRITIN, LDH, CRP in the last 72 hours.  Lab Results  Component Value Date   SARSCOV2NAA NEGATIVE 09/09/2020   SARSCOV2NAA NEGATIVE 12/21/2019     Scheduled medications:   . (feeding supplement) PROSource Plus  30 mL Oral BID BM  . sodium chloride   Intravenous Once  . chlorhexidine  15 mL Mouth Rinse BID  . Chlorhexidine Gluconate Cloth  6 each Topical Daily  . folic acid  1 mg Intravenous Daily  . influenza vaccine adjuvanted  0.5 mL Intramuscular Tomorrow-1000  . liver oil-zinc oxide   Topical BID  . magic mouthwash w/lidocaine  10 mL Oral TID  . mouth rinse  15 mL Mouth Rinse q12n4p  . multivitamin with minerals  1 tablet Oral Daily  . pantoprazole (PROTONIX) IV  40 mg Intravenous Q12H  . Ensure Max Protein  11 oz Oral BID  . sucralfate  1 g Oral TID WC & HS  . vitamin B-12  1,000 mcg Oral Daily  . white petrolatum   Topical TID         CBG: No results for input(s): GLUCAP in the last 168 hours.  SpO2: 99 %    CBC: Recent Labs  Lab 09/11/20 0526 09/12/20 0550 09/13/20 0539 09/14/20 0057 09/15/20 0330 09/16/20 1014  WBC 0.5* 0.7* 0.7* 0.8* 1.1* 1.9*  NEUTROABS 0.0* 0.0* 0.0* 0.1* 0.2*  --  HGB 8.8* 7.6* 7.1* 9.8* 6.6* 8.7*  HCT 27.9* 24.2* 23.5* 31.0* 21.0* 26.5*  MCV 93.0 94.2 96.3 93.7 93.3 88.3  PLT 5* 10* 17* 14* 8* 28*    Basic Metabolic Panel: Recent Labs  Lab 09/10/20 0045 09/11/20 0526 09/12/20 0550 09/13/20 0539 09/14/20 0057 09/15/20 0330 09/16/20 1014  NA 135 144 149* 151* 151* 143 139  K 2.9* 3.1* 3.3* 3.2* 3.1* 2.6* 2.6*  CL 107 116* 121* 121* 119* 116* 109  CO2 15* 16* 18* 21* 19* 20* 21*  GLUCOSE 102* 109* 104* 94 105* 97  91  BUN 40* 38* 38* 33* 31* 19 16  CREATININE 0.96 0.72 0.64 0.60 0.72 0.57 0.60  CALCIUM 8.7* 8.7* 8.4* 8.1* 8.5* 7.6* 7.4*  MG 2.2 2.3  --   --   --  1.7  --   PHOS  --  2.1*  --   --   --  2.4*  --      Liver Function Tests: Recent Labs  Lab 09/11/20 0526 09/12/20 0550 09/13/20 0539 09/14/20 0057 09/15/20 0330  AST 56* 49* 17 17 12*  ALT 25 35 25 23 13   ALKPHOS 131* 133* 109 136* 105  BILITOT 3.8* 4.0* 3.3* 4.9* 2.9*  PROT 6.2* 5.9* 5.6* 6.5 4.7*  ALBUMIN 2.2* 2.1* 2.0* 2.4* 1.7*     Antibiotics: Anti-infectives (From admission, onward)   Start     Dose/Rate Route Frequency Ordered Stop   09/12/20 1400  ceFAZolin (ANCEF) IVPB 1 g/50 mL premix        1 g 100 mL/hr over 30 Minutes Intravenous Every 8 hours 09/12/20 0933 09/15/20 0759   09/11/20 1400  meropenem (MERREM) 1 g in sodium chloride 0.9 % 100 mL IVPB  Status:  Discontinued        1 g 200 mL/hr over 30 Minutes Intravenous Every 8 hours 09/11/20 0728 09/12/20 0933   09/10/20 1800  meropenem (MERREM) 1 g in sodium chloride 0.9 % 100 mL IVPB  Status:  Discontinued        1 g 200 mL/hr over 30 Minutes Intravenous Every 12 hours 09/10/20 0854 09/11/20 0728   09/09/20 1545  vancomycin (VANCOCIN) 1,016 mg in sodium chloride 0.9 % 500 mL IVPB  Status:  Discontinued        20 mg/kg  50.8 kg 250 mL/hr over 120 Minutes Intravenous  Once 09/09/20 1532 09/09/20 1535   09/09/20 1545  vancomycin (VANCOCIN) IVPB 1000 mg/200 mL premix        1,000 mg 200 mL/hr over 60 Minutes Intravenous  Once 09/09/20 1535 09/09/20 1729   09/09/20 1530  meropenem (MERREM) 1 g in sodium chloride 0.9 % 100 mL IVPB  Status:  Discontinued        1 g 200 mL/hr over 30 Minutes Intravenous Every 8 hours 09/09/20 1516 09/09/20 1516   09/09/20 1515  meropenem (MERREM) 1 g in sodium chloride 0.9 % 100 mL IVPB  Status:  Discontinued        1 g 200 mL/hr over 30 Minutes Intravenous Every 8 hours 09/09/20 1504 09/10/20 0854       DVT prophylaxis:  SCDs  Code Status: Full code  Family Communication: No family at bedside   Consultants:  Hematology  Procedures:      Objective   Vitals:   09/15/20 1539 09/15/20 1614 09/15/20 1748 09/16/20 0557  BP: (!) 144/80 133/83 136/85 (!) 133/93  Pulse: 77 81 77 71  Resp: 16 18 15  19  Temp: 98.5 F (36.9 C) 97.9 F (36.6 C) 98.4 F (36.9 C) 97.8 F (36.6 C)  TempSrc: Axillary Oral Axillary Oral  SpO2: 100% 100% 100% 99%  Weight:      Height:        Intake/Output Summary (Last 24 hours) at 09/16/2020 1159 Last data filed at 09/16/2020 0525 Gross per 24 hour  Intake 2618.93 ml  Output --  Net 2618.93 ml    01/22 1901 - 01/24 0700 In: 4712.1 [P.O.:1140; I.V.:2438] Out: -   Filed Weights   09/09/20 1346 09/14/20 1800  Weight: 50.8 kg 57.6 kg    Physical Examination:   General-appears in no acute distress Heart-S1-S2, regular, no murmur auscultated HEENT-lower lip swollen, mild erythema noted Lungs-clear to auscultation bilaterally, no wheezing or crackles auscultated Abdomen-soft, nontender, no organomegaly Extremities-no edema in the lower extremities Neuro-alert, oriented x3, no focal deficit noted  Status is: Inpatient  Dispo: The patient is from: Home              Anticipated d/c is to: Home              Anticipated d/c date is: 09/19/2020              Patient currently not stable for discharge  Barrier to discharge-ongoing treatment for pancytopenia/neutropenia/UTI       Data Reviewed:   Recent Results (from the past 240 hour(s))  Resp Panel by RT-PCR (Flu A&B, Covid) Nasopharyngeal Swab     Status: None   Collection Time: 09/09/20  2:56 PM   Specimen: Nasopharyngeal Swab; Nasopharyngeal(NP) swabs in vial transport medium  Result Value Ref Range Status   SARS Coronavirus 2 by RT PCR NEGATIVE NEGATIVE Final    Comment: (NOTE) SARS-CoV-2 target nucleic acids are NOT DETECTED.  The SARS-CoV-2 RNA is generally detectable in upper  respiratory specimens during the acute phase of infection. The lowest concentration of SARS-CoV-2 viral copies this assay can detect is 138 copies/mL. A negative result does not preclude SARS-Cov-2 infection and should not be used as the sole basis for treatment or other patient management decisions. A negative result may occur with  improper specimen collection/handling, submission of specimen other than nasopharyngeal swab, presence of viral mutation(s) within the areas targeted by this assay, and inadequate number of viral copies(<138 copies/mL). A negative result must be combined with clinical observations, patient history, and epidemiological information. The expected result is Negative.  Fact Sheet for Patients:  BloggerCourse.com  Fact Sheet for Healthcare Providers:  SeriousBroker.it  This test is no t yet approved or cleared by the [place] FDA and  has been authorized for detection and/or diagnosis of SARS-CoV-2 by FDA under an Emergency Use Authorization (EUA). This EUA will remain  in effect (meaning this test can be used) for the duration of the COVID-19 declaration under Section 564(b)(1) of the Act, 21 U.S.C.section 360bbb-3(b)(1), unless the authorization is terminated  or revoked sooner.       Influenza A by PCR NEGATIVE NEGATIVE Final   Influenza B by PCR NEGATIVE NEGATIVE Final    Comment: (NOTE) The Xpert Xpress SARS-CoV-2/FLU/RSV plus assay is intended as an aid in the diagnosis of influenza from Nasopharyngeal swab specimens and should not be used as a sole basis for treatment. Nasal washings and aspirates are unacceptable for Xpert Xpress SARS-CoV-2/FLU/RSV testing.  Fact Sheet for Patients: BloggerCourse.com  Fact Sheet for Healthcare Providers: SeriousBroker.it  This test is not yet approved or cleared by the [place] and  has been  authorized for detection and/or diagnosis of SARS-CoV-2 by FDA under an Emergency Use Authorization (EUA). This EUA will remain in effect (meaning this test can be used) for the duration of the COVID-19 declaration under Section 564(b)(1) of the Act, 21 U.S.C. section 360bbb-3(b)(1), unless the authorization is terminated or revoked.  Performed at [facility], [address] [place], [place] [address]   Culture, blood (routine x 2)     Status: None   Collection Time: 09/09/20  3:30 PM   Specimen: Right Antecubital; Blood  Result Value Ref Range Status   Specimen Description   Final    RIGHT ANTECUBITAL Performed at [facility], [address] [place], [place] [address]    Special Requests   Final    BOTTLES DRAWN AEROBIC AND ANAEROBIC Blood Culture adequate volume Performed at [facility], [address] [place], [place] [address]    Culture   Final    NO GROWTH 5 DAYS Performed at [facility], [address], [place], [place] [address]    Report Status 09/14/2020 FINAL  Final  Culture, blood (routine x 2)     Status: None   Collection Time: 09/09/20  3:40 PM   Specimen: Left Antecubital; Blood  Result Value Ref Range Status   Specimen Description   Final    LEFT ANTECUBITAL Performed at [facility], [address] [place], [place] [address]    Special Requests   Final    BOTTLES DRAWN AEROBIC AND ANAEROBIC Blood Culture adequate volume Performed at [facility], [address], [place], [place] [address]    Culture   Final    NO GROWTH 5 DAYS Performed at [facility], [address] [place], [place] [address]    Report Status 09/14/2020 FINAL  Final  Urine culture     Status: Abnormal   Collection Time: 09/09/20  6:25 PM   Specimen: Urine, Clean Catch  Result Value Ref Range Status   Specimen Description   Final    URINE, CLEAN CATCH Performed at [facility], [address] [facility] [address] [place], [place] [address]    Special Requests   Final    NONE Performed at [facility], [address] [place], [place] [address]    Culture (A)  Final    60,000 COLONIES/mL STAPHYLOCOCCUS AUREUS 10,000 COLONIES/mL GROUP B STREP(S.AGALACTIAE)ISOLATED TESTING AGAINST S. AGALACTIAE NOT ROUTINELY PERFORMED DUE TO PREDICTABILITY OF AMP/PEN/VAN SUSCEPTIBILITY. Performed at [facility], [address], [place], [place] [address]    Report Status 09/12/2020 FINAL  Final   Organism ID, Bacteria STAPHYLOCOCCUS AUREUS (A)  Final      Susceptibility   Staphylococcus aureus - MIC*    CIPROFLOXACIN <=0.5 SENSITIVE Sensitive     GENTAMICIN <=0.5 SENSITIVE Sensitive     NITROFURANTOIN <=16 SENSITIVE Sensitive     OXACILLIN 0.5 SENSITIVE Sensitive     TETRACYCLINE <=1 SENSITIVE Sensitive     VANCOMYCIN 1 SENSITIVE Sensitive     TRIMETH/SULFA <=10 SENSITIVE Sensitive     CLINDAMYCIN <=0.25 SENSITIVE Sensitive     RIFAMPIN <=0.5 SENSITIVE Sensitive     Inducible Clindamycin NEGATIVE Sensitive     * 60,000 COLONIES/mL STAPHYLOCOCCUS AUREUS  MRSA PCR Screening     Status: None   Collection Time: 09/10/20  1:13 AM   Specimen: Nasal Mucosa; Nasopharyngeal  Result Value  Ref Range Status   MRSA by PCR NEGATIVE NEGATIVE Final    Comment:        The GeneXpert MRSA Assay (FDA approved for NASAL specimens only), is one component of a comprehensive MRSA colonization surveillance program. It is not intended to diagnose MRSA infection nor to guide or monitor treatment for MRSA infections. Performed at [facility], [address] [place], [place] [address]     No results for input(s): LIPASE, AMYLASE in the last 168 hours. No results for input(s): AMMONIA in the last 168 hours.  Cardiac Enzymes: No results for input(s): CKTOTAL, CKMB, CKMBINDEX, TROPONINI in the last 168 hours. BNP (last 3 results) No results for input(s): BNP in the last 8760  hours.  ProBNP (last 3 results) No results for input(s): PROBNP in the last 8760 hours.  Studies:  [place] EKG SITE RITE  Result Date: 09/14/2020 If [facility] image not attached, placement could not be confirmed due to current cardiac rhythm.      Meredeth Ide   [facility] If 7PM-7AM, please contact night-coverage at [email or web address], Office  [phone]   09/16/2020, 11:59 AM  LOS: 7 days

## 2020-09-17 ENCOUNTER — Inpatient Hospital Stay (HOSPITAL_COMMUNITY): Payer: Self-pay

## 2020-09-17 LAB — CBC WITH DIFFERENTIAL/PLATELET
Abs Immature Granulocytes: 0.49 10*3/uL — ABNORMAL HIGH (ref 0.00–0.07)
Basophils Absolute: 0 10*3/uL (ref 0.0–0.1)
Basophils Relative: 1 %
Eosinophils Absolute: 0 10*3/uL (ref 0.0–0.5)
Eosinophils Relative: 0 %
HCT: 25.7 % — ABNORMAL LOW (ref 36.0–46.0)
Hemoglobin: 8.5 g/dL — ABNORMAL LOW (ref 12.0–15.0)
Immature Granulocytes: 20 %
Lymphocytes Relative: 49 %
Lymphs Abs: 1.2 10*3/uL (ref 0.7–4.0)
MCH: 29.4 pg (ref 26.0–34.0)
MCHC: 33.1 g/dL (ref 30.0–36.0)
MCV: 88.9 fL (ref 80.0–100.0)
Monocytes Absolute: 0.3 10*3/uL (ref 0.1–1.0)
Monocytes Relative: 11 %
Neutro Abs: 0.5 10*3/uL — ABNORMAL LOW (ref 1.7–7.7)
Neutrophils Relative %: 19 %
Platelets: 26 10*3/uL — CL (ref 150–400)
RBC: 2.89 MIL/uL — ABNORMAL LOW (ref 3.87–5.11)
RDW: 14.9 % (ref 11.5–15.5)
WBC: 2.5 10*3/uL — ABNORMAL LOW (ref 4.0–10.5)
nRBC: 0 % (ref 0.0–0.2)

## 2020-09-17 LAB — RSV(RESPIRATORY SYNCYTIAL VIRUS) AB, BLOOD: RSV Ab: 1:8 {titer} — ABNORMAL HIGH

## 2020-09-17 LAB — COMPREHENSIVE METABOLIC PANEL
ALT: 12 U/L (ref 0–44)
AST: 17 U/L (ref 15–41)
Albumin: 1.8 g/dL — ABNORMAL LOW (ref 3.5–5.0)
Alkaline Phosphatase: 116 U/L (ref 38–126)
Anion gap: 12 (ref 5–15)
BUN: 12 mg/dL (ref 8–23)
CO2: 18 mmol/L — ABNORMAL LOW (ref 22–32)
Calcium: 7.3 mg/dL — ABNORMAL LOW (ref 8.9–10.3)
Chloride: 109 mmol/L (ref 98–111)
Creatinine, Ser: 0.62 mg/dL (ref 0.44–1.00)
GFR, Estimated: >60 mL/min (ref >60)
Glucose, Bld: 76 mg/dL (ref 70–99)
Potassium: 2.8 mmol/L — ABNORMAL LOW (ref 3.5–5.1)
Sodium: 139 mmol/L (ref 135–145)
Total Bilirubin: 2.2 mg/dL — ABNORMAL HIGH (ref 0.3–1.2)
Total Protein: 5 g/dL — ABNORMAL LOW (ref 6.5–8.1)

## 2020-09-17 MED ORDER — POTASSIUM CHLORIDE 10 MEQ/100ML IV SOLN
10.0000 meq | INTRAVENOUS | Status: AC
  Administered 2020-09-17 (×5): 10 meq via INTRAVENOUS
  Filled 2020-09-17 (×5): qty 100

## 2020-09-17 NOTE — Progress Notes (Signed)
[facility]  PROGRESS NOTE  Kim Ponce ZOX:[id number] DOB: [birth year] DOA: 09/09/2020 PCP: Patient, No Pcp Per   Brief HPI:   70 year old female with a history of rheumatoid arthritis, microcytic anemia, recently treated for UTI presented with mild pain and dysuria.  She has been on methotrexate for rheumatoid arthritis, developed mouth sores, dysuria vulvar swelling and rash on upper back.  Found to have methotrexate toxicity with neutropenia/pancytopenia.  Hematology was consulted.  Patient received blood and platelet transfusion.  She also is currently on cefazolin for staph aureus, group B strep UTI.  She was initially on meropenem for ESBL E. coli but repeat culture did not show ESBL E. coli.  So meropenem was switched to cefazolin.    Subjective   Patient seen and examined, WBC is improved to 2.5.  Platelet count is 26,000.   Assessment/Plan:     1. Pancytopenia-secondary to methotrexate toxicity, WBC slowly improving.  Today WBC is 2.5.  1.9, platelet count 26000.  Patient started on folic acid 1 mg daily.  Hematology  consulted.  Hematology does not recommend filgrastim/G-CSF at this time unless patient develops septic shock.  At this time hematology/oncology has signed off.  Patient can follow-up with Dr. Bertis Ruddy as outpatient after discharge.   2. UTI-patient was started on meropenem initially for ESBL E. coli, it was switched to cefazolin after repeat urine culture grew group B strep and staph aureus.  Currently afebrile.  No symptoms.  Antibiotics completed on 09/15/2020. 3. Hypokalemia-potassium is 2.8, replace potassium , check BMP in am. 4. Esophagitis/oral lesions-GI was consulted, patient started on PPI and sucralfate. Odynophagia likely from mucositis.  Continue Magic mouthwash, oral care. 5. Thrombocytopenia-platelet count is 26,000, improved.  Patient received 3 units of platelets total during this hospitalization 6. Anemia-hemoglobin is 8.7, patient received 3  units PRBC during his hospitalization. 7. Hypernatremia-sodium was elevated at 151, improved to 139 today.  0.45% normal saline was discontinued.  8. Mucositis/vaginal lesion-improved with Magic mouthwash, Desitin cream.  CMV IgM negative, Parvovirus B19 ANCA negative, B burgdorferi antibody negative, RSV negative. 9. Right upper extremity swelling noted-patient has PICC line in place.  Will obtain venous duplex of lower extremity rule out DVT.     COVID-19 Labs  No results for input(s): DDIMER, FERRITIN, LDH, CRP in the last 72 hours.  Lab Results  Component Value Date   SARSCOV2NAA NEGATIVE 09/09/2020   SARSCOV2NAA NEGATIVE 12/21/2019     Scheduled medications:   . (feeding supplement) PROSource Plus  30 mL Oral BID BM  . sodium chloride   Intravenous Once  . chlorhexidine  15 mL Mouth Rinse BID  . Chlorhexidine Gluconate Cloth  6 each Topical Daily  . folic acid  1 mg Intravenous Daily  . influenza vaccine adjuvanted  0.5 mL Intramuscular Tomorrow-1000  . liver oil-zinc oxide   Topical BID  . magic mouthwash w/lidocaine  10 mL Oral TID  . mouth rinse  15 mL Mouth Rinse q12n4p  . multivitamin with minerals  1 tablet Oral Daily  . pantoprazole (PROTONIX) IV  40 mg Intravenous Q12H  . Ensure Max Protein  11 oz Oral BID  . sucralfate  1 g Oral TID WC & HS  . vitamin B-12  1,000 mcg Oral Daily  . white petrolatum   Topical TID         CBG: No results for input(s): GLUCAP in the last 168 hours.  SpO2: 100 %    CBC: Recent Labs  Lab 09/12/20 0550  09/13/20 0539 09/14/20 0057 09/15/20 0330 09/16/20 1014 09/17/20 0313  WBC 0.7* 0.7* 0.8* 1.1* 1.9* 2.5*  NEUTROABS 0.0* 0.0* 0.1* 0.2*  --  0.5*  HGB 7.6* 7.1* 9.8* 6.6* 8.7* 8.5*  HCT 24.2* 23.5* 31.0* 21.0* 26.5* 25.7*  MCV 94.2 96.3 93.7 93.3 88.3 88.9  PLT 10* 17* 14* 8* 28* 26*    Basic Metabolic Panel: Recent Labs  Lab 09/11/20 0526 09/12/20 0550 09/13/20 0539 09/14/20 0057 09/15/20 0330  09/16/20 1014 09/17/20 0313  NA 144   < > 151* 151* 143 139 139  K 3.1*   < > 3.2* 3.1* 2.6* 2.6* 2.8*  CL 116*   < > 121* 119* 116* 109 109  CO2 16*   < > 21* 19* 20* 21* 18*  GLUCOSE 109*   < > 94 105* 97 91 76  BUN 38*   < > 33* 31* 19 16 12   CREATININE 0.72   < > 0.60 0.72 0.57 0.60 0.62  CALCIUM 8.7*   < > 8.1* 8.5* 7.6* 7.4* 7.3*  MG 2.3  --   --   --  1.7  --   --   PHOS 2.1*  --   --   --  2.4*  --   --    < > = values in this interval not displayed.     Liver Function Tests: Recent Labs  Lab 09/12/20 0550 09/13/20 0539 09/14/20 0057 09/15/20 0330 09/17/20 0313  AST 49* 17 17 12* 17  ALT 35 25 23 13 12   ALKPHOS 133* 109 136* 105 116  BILITOT 4.0* 3.3* 4.9* 2.9* 2.2*  PROT 5.9* 5.6* 6.5 4.7* 5.0*  ALBUMIN 2.1* 2.0* 2.4* 1.7* 1.8*     Antibiotics: Anti-infectives (From admission, onward)   Start     Dose/Rate Route Frequency Ordered Stop   09/12/20 1400  ceFAZolin (ANCEF) IVPB 1 g/50 mL premix        1 g 100 mL/hr over 30 Minutes Intravenous Every 8 hours 09/12/20 0933 09/15/20 0759   09/11/20 1400  meropenem (MERREM) 1 g in sodium chloride 0.9 % 100 mL IVPB  Status:  Discontinued        1 g 200 mL/hr over 30 Minutes Intravenous Every 8 hours 09/11/20 0728 09/12/20 0933   09/10/20 1800  meropenem (MERREM) 1 g in sodium chloride 0.9 % 100 mL IVPB  Status:  Discontinued        1 g 200 mL/hr over 30 Minutes Intravenous Every 12 hours 09/10/20 0854 09/11/20 0728   09/09/20 1545  vancomycin (VANCOCIN) 1,016 mg in sodium chloride 0.9 % 500 mL IVPB  Status:  Discontinued        20 mg/kg  50.8 kg 250 mL/hr over 120 Minutes Intravenous  Once 09/09/20 1532 09/09/20 1535   09/09/20 1545  vancomycin (VANCOCIN) IVPB 1000 mg/200 mL premix        1,000 mg 200 mL/hr over 60 Minutes Intravenous  Once 09/09/20 1535 09/09/20 1729   09/09/20 1530  meropenem (MERREM) 1 g in sodium chloride 0.9 % 100 mL IVPB  Status:  Discontinued        1 g 200 mL/hr over 30 Minutes  Intravenous Every 8 hours 09/09/20 1516 09/09/20 1516   09/09/20 1515  meropenem (MERREM) 1 g in sodium chloride 0.9 % 100 mL IVPB  Status:  Discontinued        1 g 200 mL/hr over 30 Minutes Intravenous Every 8 hours 09/09/20 1504 09/10/20 0854  DVT prophylaxis: SCDs  Code Status: Full code  Family Communication: No family at bedside   Consultants:  Hematology  Procedures:      Objective   Vitals:   09/15/20 1748 09/16/20 0557 09/16/20 1428 09/16/20 1939  BP: 136/85 (!) 133/93 127/74 (!) 151/95  Pulse: 77 71 73 80  Resp: Temp: 98.4 F (36.9 C) 97.8 F (36.6 C) 98 F (36.7 C) 97.8 F (36.6 C)  TempSrc: Axillary Oral Oral Oral  SpO2: 100% 99% 100% 100%  Weight:      Height:        Intake/Output Summary (Last 24 hours) at 09/17/2020 1356 Last data filed at 09/17/2020 0600 Gross per 24 hour  Intake 800.1 ml  Output --  Net 800.1 ml    01/23 1901 - 01/25 0700 In: 2121.5 [P.O.:960; I.V.:661.4] Out: -   Filed Weights   09/09/20 1346 09/14/20 1800  Weight: 50.8 kg 57.6 kg    Physical Examination:  General-appears in no acute distress Heart-S1-S2, regular, no murmur auscultated HEENT-dry crusted lesion noted on the lower lip.  Swelling on lower lip. Lungs-clear to auscultation bilaterally, no wheezing or crackles auscultated Abdomen-soft, nontender, no organomegaly Extremities-right upper extremity swelling noted Neuro-alert, oriented x3, no focal deficit noted   Status is: Inpatient  Dispo: The patient is from: Home              Anticipated d/c is to: Home              Anticipated d/c date is: 09/19/2020              Patient currently not stable for discharge  Barrier to discharge-ongoing treatment for pancytopenia, likely home in next 2 to 3 days.       Data Reviewed:   Recent Results (from the past 240 hour(s))  Resp Panel by RT-PCR (Flu A&B, Covid) Nasopharyngeal Swab     Status: None   Collection Time: 09/09/20  2:56  PM   Specimen: Nasopharyngeal Swab; Nasopharyngeal(NP) swabs in vial transport medium  Result Value Ref Range Status   SARS Coronavirus 2 by RT PCR NEGATIVE NEGATIVE Final    Comment: (NOTE) SARS-CoV-2 target nucleic acids are NOT DETECTED.  The SARS-CoV-2 RNA is generally detectable in upper respiratory specimens during the acute phase of infection. The lowest concentration of SARS-CoV-2 viral copies this assay can detect is 138 copies/mL. A negative result does not preclude SARS-Cov-2 infection and should not be used as the sole basis for treatment or other patient management decisions. A negative result may occur with  improper specimen collection/handling, submission of specimen other than nasopharyngeal swab, presence of viral mutation(s) within the areas targeted by this assay, and inadequate number of viral copies(<138 copies/mL). A negative result must be combined with clinical observations, patient history, and epidemiological information. The expected result is Negative.  Fact Sheet for Patients:  BloggerCourse.com  Fact Sheet for Healthcare Providers:  SeriousBroker.it  This test is no t yet approved or cleared by the [place] FDA and  has been authorized for detection and/or diagnosis of SARS-CoV-2 by FDA under an Emergency Use Authorization (EUA). This EUA will remain  in effect (meaning this test can be used) for the duration of the COVID-19 declaration under Section 564(b)(1) of the Act, 21 U.S.C.section 360bbb-3(b)(1), unless the authorization is terminated  or revoked sooner.       Influenza A by PCR NEGATIVE NEGATIVE Final   Influenza B by PCR NEGATIVE NEGATIVE  Final    Comment: (NOTE) The Xpert Xpress SARS-CoV-2/FLU/RSV plus assay is intended as an aid in the diagnosis of influenza from Nasopharyngeal swab specimens and should not be used as a sole basis for treatment. Nasal washings and aspirates are  unacceptable for Xpert Xpress SARS-CoV-2/FLU/RSV testing.  Fact Sheet for Patients: BloggerCourse.com  Fact Sheet for Healthcare Providers: SeriousBroker.it  This test is not yet approved or cleared by the [place] FDA and has been authorized for detection and/or diagnosis of SARS-CoV-2 by FDA under an Emergency Use Authorization (EUA). This EUA will remain in effect (meaning this test can be used) for the duration of the COVID-19 declaration under Section 564(b)(1) of the Act, 21 U.S.C. section 360bbb-3(b)(1), unless the authorization is terminated or revoked.  Performed at [facility], [address] [place], [place] [address]   Culture, blood (routine x 2)     Status: None   Collection Time: 09/09/20  3:30 PM   Specimen: Right Antecubital; Blood  Result Value Ref Range Status   Specimen Description   Final    RIGHT ANTECUBITAL Performed at [facility], [address] [place], [place] [address]    Special Requests   Final    BOTTLES DRAWN AEROBIC AND ANAEROBIC Blood Culture adequate volume Performed at [facility], [address] [place], [place] [address]    Culture   Final    NO GROWTH 5 DAYS Performed at [facility], [address], [place], [place] [address]    Report Status 09/14/2020 FINAL  Final  Culture, blood (routine x 2)     Status: None   Collection Time: 09/09/20  3:40 PM   Specimen: Left Antecubital; Blood  Result Value Ref Range Status   Specimen Description   Final    LEFT ANTECUBITAL Performed at [facility], [address] [place], [place] [address]    Special Requests   Final    BOTTLES DRAWN AEROBIC AND ANAEROBIC Blood Culture adequate volume Performed at [facility], [address] [place], [place] [address]    Culture   Final    NO GROWTH 5 DAYS Performed at [facility], [address] [place], [place]  [address]    Report Status 09/14/2020 FINAL  Final  Urine culture     Status: Abnormal   Collection Time: 09/09/20  6:25 PM   Specimen: Urine, Clean Catch  Result Value Ref Range Status   Specimen Description   Final    URINE, CLEAN CATCH Performed at [facility], [address] [place], [place] [address]    Special Requests   Final    NONE Performed at [facility], [address] [place], [place] [address]    Culture (A)  Final    60,000 COLONIES/mL STAPHYLOCOCCUS AUREUS 10,000 COLONIES/mL GROUP B STREP(S.AGALACTIAE)ISOLATED TESTING AGAINST S. AGALACTIAE NOT ROUTINELY PERFORMED DUE TO PREDICTABILITY OF AMP/PEN/VAN SUSCEPTIBILITY. Performed at [facility], [address] [place], [place] [address]    Report Status 09/12/2020 FINAL  Final   Organism ID, Bacteria STAPHYLOCOCCUS AUREUS (A)  Final      Susceptibility   Staphylococcus aureus - MIC*    CIPROFLOXACIN <=0.5 SENSITIVE Sensitive     GENTAMICIN <=0.5 SENSITIVE Sensitive     NITROFURANTOIN <=16 SENSITIVE Sensitive     OXACILLIN 0.5 SENSITIVE Sensitive     TETRACYCLINE <=1 SENSITIVE Sensitive  VANCOMYCIN 1 SENSITIVE Sensitive     TRIMETH/SULFA <=10 SENSITIVE Sensitive     CLINDAMYCIN <=0.25 SENSITIVE Sensitive     RIFAMPIN <=0.5 SENSITIVE Sensitive     Inducible Clindamycin NEGATIVE Sensitive     * 60,000 COLONIES/mL STAPHYLOCOCCUS AUREUS  MRSA PCR Screening     Status: None   Collection Time: 09/10/20  1:13 AM   Specimen: Nasal Mucosa; Nasopharyngeal  Result Value Ref Range Status   MRSA by PCR NEGATIVE NEGATIVE Final    Comment:        The GeneXpert MRSA Assay (FDA approved for NASAL specimens only), is one component of a comprehensive MRSA colonization surveillance program. It is not intended to diagnose MRSA infection nor to guide or monitor treatment for MRSA infections. Performed at [facility], [address], [place], [place] [address]          Meredeth Ide   [facility] If 7PM-7AM, please contact night-coverage at [email or web address], Office  [phone]   09/17/2020, 1:56 PM  LOS: 8 days

## 2020-09-17 NOTE — Progress Notes (Addendum)
Kim Ponce   DOB:1951/01/11   OH#:[id number]    I have seen the patient and examined her and agree with the documentation as follows  ASSESSMENT & PLAN:  Pancytopenia with neutropenic fever Pancytopenia likely due to methotrexate toxicity No evidence to suggest an underlying bone marrow disorder -we do not plan to perform a bone marrow biopsy I have reviewed her hospitalization with the rheumatologist office and advised against resumption of methotrexate in the near future Folate level was significantly low and she was started on folic acid 1 mg daily Recommend supportive care with transfusion support if hemoglobin is less than 7 or platelets are less than 10,000 She has received a total of 3 units of PRBCs this admission on 09/11/2020, 09/13/2020, and 09/15/2020 Hemoglobin stable Will observe for now She has received a total of 3 units of platelets this admission on 09/11/2020, 09/12/2020, and 09/15/2020 Platelets stable and will observe for now Anticipate this will take several weeks for bone marrow recovery WBC is sligthly better since admission; overall, I believe she is heading towards recovery No need for G-CSF support unless she develops septic shock   Hyperbilirubinemia Unclear etiology, but most likely due to methotrexate toxicity Monitor, slowly improving   Urinary tract infection UTI symptoms have resolved She is now off antibiotics   Severe mucositis, slowly improving Due to methotrexate toxicity Continue supportive care with Magic mouthwash and pain medication   AKI, resolved Due to dehydration Observe for now   Discharge planning She will likely be here another 1 to 2 days Still has poor nutrition and electrolytes abnormalities and will defer to hospitalist for management Counts have stabilized Hematology will sign off The patient is instructed to call for outpatient follow-up in about 2 weeks after discharge Please call [phone]  All questions were  answered. The patient knows to call the clinic with any problems, questions or concerns.    Mikey Bussing, NP 09/17/2020 8:11 AM Heath Lark, MD  Subjective:  She is seen for further follow-up regarding pancytopenia due to methotrexate toxicity No bleeding form her mouth reported Remains afebrile Continues to slowly improve daily She continues to take in liquids via syringe States that she has been able to eat pudding and applesauce-planning to try mashed potatoes later today Has right arm swelling and bruising - Doppler ultrasound ordered  Objective:  Vitals:   09/16/20 1428 09/16/20 1939  BP: 127/74 (!) 151/95  Pulse: 73 80  Resp: 17 20  Temp: 98 F (36.7 C) 97.8 F (36.6 C)  SpO2: 100% 100%     Intake/Output Summary (Last 24 hours) at 09/17/2020 0811 Last data filed at 09/17/2020 0600 Gross per 24 hour  Intake 920.1 ml  Output --  Net 920.1 ml    GENERAL:alert, no distress and comfortable SKIN: Noted skin bruising EYES: normal, Conjunctiva are pink and non-injected, sclera clear OROPHARYNX: Persistent mucositis with dried blood in her mouth which is improving NEURO: alert & oriented x 3 with fluent speech, no focal motor/sensory deficits   Labs:  Recent Labs    12/21/19 1001 12/26/19 0321 09/09/20 1354 09/10/20 0045 09/11/20 0526 09/14/20 0057 09/15/20 0330 09/16/20 1014 09/17/20 0313  NA 139 137   < > 135   < > 151* 143 139 139  K 4.2 4.6   < > 2.9*   < > 3.1* 2.6* 2.6* 2.8*  CL 109 109   < > 107   < > 119* 116* 109 109  CO2 23 25   < >  15*   < > 19* 20* 21* 18*  GLUCOSE 102* 158*   < > 102*   < > 105* 97 91 76  BUN 19 13   < > 40*   < > 31* '19 16 12  ' CREATININE 0.67 0.64   < > 0.96   < > 0.72 0.57 0.60 0.62  CALCIUM 9.1 8.5*   < > 8.7*   < > 8.5* 7.6* 7.4* 7.3*  GFRNONAA >60 >60   < > >60   < > >60 >60 >60 >60  GFRAA >60 >60  --   --   --   --   --   --   --   PROT  --   --    < > 7.3   < > 6.5 4.7*  --  5.0*  ALBUMIN  --   --    < > 2.8*   < > 2.4*  1.7*  --  1.8*  AST  --   --    < > 18   < > 17 12*  --  17  ALT  --   --    < > 13   < > 23 13  --  12  ALKPHOS  --   --    < > 168*   < > 136* 105  --  116  BILITOT  --   --    < > 5.8*   < > 4.9* 2.9*  --  2.2*  BILIDIR  --   --   --  3.7*  --   --   --   --   --    < > = values in this interval not displayed.    Studies:  DG Chest Port 1 View  Result Date: 09/09/2020 CLINICAL DATA:  Fever and tachycardia EXAM: PORTABLE CHEST 1 VIEW COMPARISON:  September 05, 2020 FINDINGS: The lungs are clear. The heart size and pulmonary vascularity are normal. No adenopathy. No bone lesions. IMPRESSION: Lungs clear.  Cardiac silhouette. Electronically Signed   By: Lowella Grip III M.D.   On: 09/09/2020 15:20   DG Chest Portable 1 View  Result Date: 09/05/2020 CLINICAL DATA:  Shortness of breath. EXAM: PORTABLE CHEST 1 VIEW COMPARISON:  December 21, 2019. FINDINGS: The heart size and mediastinal contours are within normal limits. Both lungs are clear. No visible pleural effusions or pneumothorax. No acute osseous abnormality. IMPRESSION: No active disease. Electronically Signed   By: Margaretha Sheffield MD   On: 09/05/2020 16:29   [place] EKG SITE RITE  Result Date: 09/14/2020 If Site Rite image not attached, placement could not be confirmed due to current cardiac rhythm.  [place] EKG SITE RITE  Result Date: 09/14/2020 If [facility] image not attached, placement could not be confirmed due to current cardiac rhythm.  US Abdomen Limited RUQ (LIVER/GB)  Result Date: 09/10/2020 CLINICAL DATA:  70 year old female with elevated bilirubin. EXAM: ULTRASOUND ABDOMEN LIMITED RIGHT UPPER QUADRANT COMPARISON:  None. FINDINGS: Gallbladder: No gallstones or wall thickening visualized. No sonographic Murphy sign noted by sonographer. Common bile duct: Diameter: 4 mm, normal. Liver: Liver echogenicity appears normal (image 41). No intrahepatic biliary ductal dilatation is evident. No discrete liver lesion identified. Portal vein  is patent on color Doppler imaging with normal direction of blood flow towards the liver. Other: No free fluid.  Negative visible right renal upper pole. IMPRESSION: Normal right upper quadrant ultrasound. No explanation for elevated bilirubin. Electronically Signed   By:  Genevie Ann M.D.   On: 09/10/2020 08:55

## 2020-09-17 NOTE — TOC Initial Note (Signed)
Transition of Care [facility]) - Initial/Assessment Note    Patient Details  Name: Kim Ponce MRN: [id number] Date of Birth: 1950-11-15  Transition of Care [facility]) CM/SW Contact:    Lanier Clam, RN Phone Number: 09/17/2020, 11:20 AM  Clinical Narrative:  From home, has support. No pcp-provided w/pcp listing for [facility], & [facility] pcp's. Patient can make own appt. Financial navigator will assess resources, or refer to med asst if needed.Has own transport. Continue to monitor d/c needs.                Expected Discharge Plan: Home/Self Care Barriers to Discharge: Continued Medical Work up   Patient Goals and CMS Choice Patient states their goals for this hospitalization and ongoing recovery are:: home CMS Medicare.gov Compare Post Acute Care list provided to:: Patient    Expected Discharge Plan and Services Expected Discharge Plan: Home/Self Care   Discharge Planning Services: CM Consult   Living arrangements for the past 2 months: Single Family Home                                      Prior Living Arrangements/Services Living arrangements for the past 2 months: Single Family Home Lives with:: Self Patient language and need for interpreter reviewed:: Yes Do you feel safe going back to the place where you live?: Yes      Need for Family Participation in Patient Care: No (Comment) Care giver support system in place?: Yes (comment)   Criminal Activity/Legal Involvement Pertinent to Current Situation/Hospitalization: No - Comment as needed  Activities of Daily Living Home Assistive Devices/Equipment: None ADL Screening (condition at time of admission) Patient's cognitive ability adequate to safely complete daily activities?: Yes Is the patient deaf or have difficulty hearing?: No Does the patient have difficulty seeing, even when wearing glasses/contacts?: No Does the patient have difficulty concentrating, remembering, or making decisions?: No Patient able to express  need for assistance with ADLs?: Yes Does the patient have difficulty dressing or bathing?: No Independently performs ADLs?: Yes (appropriate for developmental age) Does the patient have difficulty walking or climbing stairs?: No Weakness of Legs: None Weakness of Arms/Hands: None  Permission Sought/Granted Permission sought to share information with : Case Manager Permission granted to share information with : Yes, Verbal Permission Granted  Share Information with NAME: Case manager           Emotional Assessment Appearance:: Appears stated age Attitude/Demeanor/Rapport: Gracious Affect (typically observed): Accepting Orientation: : Oriented to Self,Oriented to Place,Oriented to  Time,Oriented to Situation Alcohol / Substance Use: Not Applicable Psych Involvement: No (comment)  Admission diagnosis:  Mucositis [K12.30] Neutropenic fever (HCC) [D70.9, R50.81] UTI due to extended-spectrum beta lactamase (ESBL) producing Escherichia coli [N39.0, B96.29, Z16.12] Patient Active Problem List   Diagnosis Date Noted  . Pancytopenia (HCC) 09/10/2020  . Hypokalemia 09/10/2020  . UTI due to extended-spectrum beta lactamase (ESBL) producing Escherichia coli 09/10/2020  . Hyperbilirubinemia 09/10/2020  . Renal insufficiency 09/10/2020  . Prolonged QT interval 09/10/2020  . Mucositis 09/10/2020  . Rheumatoid arthritis (HCC) 09/10/2020  . Neutropenic fever (HCC) 09/09/2020  . 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 Osteoarthritis of left hip 12/22/2019  . Osteoarthritis of left knee 09/28/2012   PCP:  Patient, No Pcp Per Pharmacy:   [id number] [place], [place] - [address] [place] [address] Phone: [phone] Fax: [phone]  Social Determinants of Health (SDOH) Interventions    Readmission Risk Interventions No flowsheet data found.

## 2020-09-18 ENCOUNTER — Inpatient Hospital Stay (HOSPITAL_COMMUNITY): Payer: Self-pay

## 2020-09-18 DIAGNOSIS — R609 Edema, unspecified: Secondary | ICD-10-CM

## 2020-09-18 LAB — BASIC METABOLIC PANEL
Anion gap: 9 (ref 5–15)
BUN: 8 mg/dL (ref 8–23)
CO2: 22 mmol/L (ref 22–32)
Calcium: 7.1 mg/dL — ABNORMAL LOW (ref 8.9–10.3)
Chloride: 104 mmol/L (ref 98–111)
Creatinine, Ser: 0.47 mg/dL (ref 0.44–1.00)
GFR, Estimated: >60 mL/min (ref >60)
Glucose, Bld: 83 mg/dL (ref 70–99)
Potassium: 2.8 mmol/L — ABNORMAL LOW (ref 3.5–5.1)
Sodium: 135 mmol/L (ref 135–145)

## 2020-09-18 LAB — CBC
HCT: 24.8 % — ABNORMAL LOW (ref 36.0–46.0)
Hemoglobin: 8.2 g/dL — ABNORMAL LOW (ref 12.0–15.0)
MCH: 29.3 pg (ref 26.0–34.0)
MCHC: 33.1 g/dL (ref 30.0–36.0)
MCV: 88.6 fL (ref 80.0–100.0)
Platelets: 42 10*3/uL — ABNORMAL LOW (ref 150–400)
RBC: 2.8 MIL/uL — ABNORMAL LOW (ref 3.87–5.11)
RDW: 14.3 % (ref 11.5–15.5)
WBC: 4 10*3/uL (ref 4.0–10.5)
nRBC: 0 % (ref 0.0–0.2)

## 2020-09-18 MED ORDER — CHLORHEXIDINE GLUCONATE 0.12 % MT SOLN: 15.0000 mL | Freq: Two times a day (BID) | OROMUCOSAL | 0 refills | Status: DC

## 2020-09-18 MED ORDER — POTASSIUM CHLORIDE CRYS ER 20 MEQ PO TBCR: 40.0000 meq | EXTENDED_RELEASE_TABLET | Freq: Every day | ORAL | 0 refills | Status: DC

## 2020-09-18 MED ORDER — FOLIC ACID 1 MG PO TABS: 1.0000 mg | ORAL_TABLET | Freq: Every day | ORAL | 2 refills | Status: AC

## 2020-09-18 MED ORDER — SUCRALFATE 1 G PO TABS: 1.0000 g | ORAL_TABLET | Freq: Four times a day (QID) | ORAL | 1 refills | Status: AC

## 2020-09-18 MED ORDER — LIP MEDEX EX OINT: TOPICAL_OINTMENT | CUTANEOUS | 0 refills | Status: DC | PRN

## 2020-09-18 MED ORDER — POTASSIUM CHLORIDE CRYS ER 20 MEQ PO TBCR
40.0000 meq | EXTENDED_RELEASE_TABLET | Freq: Every day | ORAL | Status: DC
  Administered 2020-09-18: 40 meq via ORAL
  Filled 2020-09-18: qty 2

## 2020-09-18 MED ORDER — PANTOPRAZOLE SODIUM 40 MG PO TBEC: 40.0000 mg | DELAYED_RELEASE_TABLET | Freq: Every day | ORAL | 1 refills | Status: AC

## 2020-09-18 MED ORDER — FOLIC ACID 5 MG/ML IJ SOLN: 1.0000 mg | Freq: Every day | INTRAMUSCULAR | 2 refills | Status: DC

## 2020-09-18 MED ORDER — MAGIC MOUTHWASH W/LIDOCAINE: 10.0000 mL | Freq: Three times a day (TID) | ORAL | 2 refills | Status: DC

## 2020-09-18 MED ORDER — ADULT MULTIVITAMIN W/MINERALS CH: 1.0000 | ORAL_TABLET | Freq: Every day | ORAL | 2 refills | Status: AC

## 2020-09-18 MED ORDER — CYANOCOBALAMIN 1000 MCG PO TABS: 1000.0000 ug | ORAL_TABLET | Freq: Every day | ORAL | 2 refills | Status: AC

## 2020-09-18 NOTE — Progress Notes (Signed)
Nutrition Follow-up  DOCUMENTATION CODES:   Non-severe (moderate) malnutrition in context of chronic illness  INTERVENTION:  - continue Ensure Max BID and 30 ml Prosource Plus BID. - discussed foods to begin incorporating at home.   NUTRITION DIAGNOSIS:   Moderate Malnutrition related to chronic illness (rheumatoid arthritis) as evidenced by moderate fat depletion,moderate muscle depletion. -revised, ongoing  GOAL:   Patient will meet greater than or equal to 90% of their needs -unmet at this time  MONITOR:   PO intake,Supplement acceptance,Labs,Weight trends  ASSESSMENT:   70 y/o female with h/o RA, anemia and recent UTI who is admitted with pancytopenia/neutropenic fever, mucositis and UTI  Diet advanced from FLD to Soft on 1/25 at 1403. Per review of flow sheet, she has been eating 0-25% of meals over the past 1 week. She has been accepting oral nutrition supplements ~50% of the time offered.   Discharge order and discharge summary in for discharge to home.  Patient was sitting up in bed at the time of RD visit earlier this AM. No family or visitors present.   Patient reports mainly consuming liquids such as broth, yogurt, pudding, ice cream, and beverages. She attempted grits this AM but reports they were not thin enough for her to consume comfortably.   Discussed current diet order and foods that may be easy to incorporate at home in addition to liquids.  Patient indicates that oral pain is ongoing but has improved and that she is also able to open her mouth wider and talk more easily than she could earlier in admission.   She reports that she was able to ambulate without difficulty PTA and that she anticipates that this will be the same when she returns home.   Weight today is +15 lb compared to admission weight. Mild pitting edema to BLE and moderate pitting edema to BUE documented in the edema section of flow sheet.    Labs reviewed; K: 2.8 mmol/l, Ca: 7.1 mg/dl; on  4/81 Phos was 2.1 mg/dl and Mg was WDL.  Medications reviewed; 1 mg IV folic acid/day, 1 tablet multivitamin with minerals/day, 40 mg IV protonix BID, 10 mEq IV KCl x5 doses 1/25, 40 mEq Klor-Con/day, 1 g carafate TID, 1000 mcg oral cyanocobalamin/day.     NUTRITION - FOCUSED PHYSICAL EXAM:  Flowsheet Row Most Recent Value  Orbital Region Moderate depletion  Upper Arm Region Moderate depletion  Thoracic and Lumbar Region Mild depletion  Buccal Region Severe depletion  Temple Region Moderate depletion  Clavicle Bone Region Moderate depletion  Clavicle and Acromion Bone Region Moderate depletion  Scapular Bone Region Moderate depletion  Dorsal Hand Severe depletion  Patellar Region Unable to assess  Anterior Thigh Region Unable to assess  Posterior Calf Region Unable to assess  Edema (RD Assessment) Unable to assess  Hair Reviewed  Eyes Reviewed  Mouth Reviewed  [noted blood and crusting to lips, especially lower lip]  Skin Reviewed  Nails Reviewed       Diet Order:   Diet Order            Diet - low sodium heart healthy           DIET SOFT Room service appropriate? Yes; Fluid consistency: Thin  Diet effective now                 EDUCATION NEEDS:   Education needs have been addressed  Skin:  Skin Assessment: Reviewed RN Assessment  Last BM:  1/25 (type 4 x1; type 7  x1)  Height:   Ht Readings from Last 1 Encounters:  09/09/20 5\' 6"  (1.676 m)    Weight:   Wt Readings from Last 1 Encounters:  09/14/20 57.6 kg    Ideal Body Weight:     BMI:  Body mass index is 20.5 kg/m.  Estimated Nutritional Needs:   Kcal:  1600-1800 kcal  Protein:  80-90 grams  Fluid:  1.5L/day     09/16/20, MS, RD, LDN, CNSC Inpatient Clinical Dietitian RD pager # available in [facility]  After hours/weekend pager # available in [facility]

## 2020-09-18 NOTE — Progress Notes (Signed)
Right upper extremity venous duplex has been completed. Preliminary results can be found in CV Proc through chart review.   09/18/20 8:58 AM Olen Cordial RVT

## 2020-09-18 NOTE — Discharge Summary (Signed)
Physician Discharge Summary  Kim Ponce NWG:[id number] DOB: 1950-12-23 DOA: 09/09/2020  PCP: Patient, No Pcp Per  Admit date: 09/09/2020 Discharge date: 09/18/2020  Time spent: 32 minutes  Recommendations for Outpatient Follow-up:  1. Needs outpatient Chem-12 CBC 1 week 2. Recommend outpatient follow-up with Dr. Dierdre Forth rheumatologist with regards to further options for rheumatoid arthritis 3. Continue supplements with folic acid B12 other vitamins  Discharge Diagnoses:  Principal Problem:   Neutropenic fever (HCC) Active Problems:   Pancytopenia (HCC)   Hypokalemia   UTI due to extended-spectrum beta lactamase (ESBL) producing Escherichia coli   Hyperbilirubinemia   Renal insufficiency   Prolonged QT interval   Mucositis   Rheumatoid arthritis (HCC)   Discharge Condition: Improved  Diet recommendation: Heart healthy  Filed Weights   09/09/20 1346 09/14/20 1800  Weight: 50.8 kg 57.6 kg    History of present illness:  70 year old white female rheumatoid arthritis microcytic anemia recent UTI treated with Bactrim-developed mouth sores dysuria vulvar rash and upper back rash  Found to have methotrexate toxicity with pancytopenia hematology saw the patient she received transfusions was placed on meropenem for ESBL UTI but this was switched to cefazolin and this was discontinued  Hospital Course:  Pancytopenia and mucositis  secondary to methotrexate toxicity Currently resolved at this time White count is up to 4 9 expect that we will continue to improve as his platelet which is 42 is improved continue Folate and b12 and MVI Needs labs in about 1 week or so Work-up with CMV parvovirus B Borrelia RSV were all negative Hypokalemia  replacing orally--will give tablets Need slabs in about 1 week Esophagitis PPI Stopped Placing on Carafate on d/c  Will need to continue until Carafate until able to eat reliably without issues Can continue Magic mouthwash in  addition Severe thrombocytopenia and pancytopenia Received 3 units of platelets also 3 units of blood this admission Resolved  Consultations: Hematology oncology awake coherent severe  Discharge Exam: Vitals:   09/17/20 2123 09/18/20 0626  BP: (!) 141/84 140/88  Pulse: 85 91  Resp: 18 16  Temp: 98.4 F (36.9 C) 97.6 F (36.4 C)  SpO2: 99% 100%    General: Awake alert severe swelling and scarring of lower lip-coherent x4 Cardiovascular: S1-S2 no murmur no rub no gallop RRR Respiratory: Clinically clear no added sound Abdomen soft no rebound no guarding  Discharge Instructions   Discharge Instructions    Diet - low sodium heart healthy   Complete by: As directed    Discharge instructions   Complete by: As directed    Please keep your follow-up appointment with your rheumatologist Dr. Dierdre Forth to talk to him about further options for your rheumatoid arthritis you will need labs in about 1 week note that we have held her aspirin I would not take it for the time being   Increase activity slowly   Complete by: As directed      Allergies as of 09/18/2020      Reactions   Morphine And Related Hives   Penicillins Hives      Sulfa Antibiotics Other (See Comments)   REACTION:  unknown   Codeine Rash      Medication List    STOP taking these medications   aspirin EC 81 MG tablet   celecoxib 200 MG capsule Commonly known as: CELEBREX   ENBREL [place]   nitrofurantoin (macrocrystal-monohydrate) 100 MG capsule Commonly known as: MACROBID     TAKE these medications   chlorhexidine 0.12 %  solution Commonly known as: PERIDEX 15 mLs by Mouth Rinse route 2 (two) times daily.   cyanocobalamin 1000 MCG tablet Take 1 tablet (1,000 mcg total) by mouth daily.   folic acid 5 MG/ML injection Inject 0.2 mLs (1 mg total) into the vein daily.   HYDROcodone-acetaminophen 5-325 MG tablet Commonly known as: NORCO/VICODIN Take 1 tablet by mouth 3 (three) times daily as needed for  pain.   lip balm ointment Apply topically as needed for lip care.   magic mouthwash w/lidocaine Soln Take 10 mLs by mouth 3 (three) times daily.   multivitamin with minerals Tabs tablet Take 1 tablet by mouth daily.   pantoprazole 40 MG tablet Commonly known as: Protonix Take 1 tablet (40 mg total) by mouth daily.   potassium chloride SA 20 MEQ tablet Commonly known as: KLOR-CON Take 2 tablets (40 mEq total) by mouth daily.   sucralfate 1 g tablet Commonly known as: Carafate Take 1 tablet (1 g total) by mouth 4 (four) times daily.      Allergies  Allergen Reactions  . Morphine And Related Hives  . Penicillins Hives                       . Sulfa Antibiotics Other (See Comments)    REACTION:  unknown  . Codeine Rash    Follow-up Information    Primary Care physician. Schedule an appointment as soon as possible for a visit.        Artis Delay, MD. Schedule an appointment as soon as possible for a visit in 2 week(s).   Specialty: Hematology and Oncology Contact information: [address] [place] [address] [phone]                The results of significant diagnostics from this hospitalization (including imaging, microbiology, ancillary and laboratory) are listed below for reference.    Significant Diagnostic Studies: DG Chest Port 1 View  Result Date: 09/09/2020 CLINICAL DATA:  Fever and tachycardia EXAM: PORTABLE CHEST 1 VIEW COMPARISON:  September 05, 2020 FINDINGS: The lungs are clear. The heart size and pulmonary vascularity are normal. No adenopathy. No bone lesions. IMPRESSION: Lungs clear.  Cardiac silhouette. Electronically Signed   By: Bretta Bang III M.D.   On: 09/09/2020 15:20   DG Chest Portable 1 View  Result Date: 09/05/2020 CLINICAL DATA:  Shortness of breath. EXAM: PORTABLE CHEST 1 VIEW COMPARISON:  December 21, 2019. FINDINGS: The heart size and mediastinal contours are within normal limits. Both  lungs are clear. No visible pleural effusions or pneumothorax. No acute osseous abnormality. IMPRESSION: No active disease. Electronically Signed   By: Feliberto Harts MD   On: 09/05/2020 16:29   VAS [place] UPPER EXTREMITY VENOUS DUPLEX  Result Date: 09/18/2020 UPPER VENOUS STUDY  Indications: Edema Risk Factors: None identified. Limitations: Bandages and line. Comparison Study: No prior studies. Performing Technologist: Chanda Busing RVT  Examination Guidelines: A complete evaluation includes B-mode imaging, spectral Doppler, color Doppler, and power Doppler as needed of all accessible portions of each vessel. Bilateral testing is considered an integral part of a complete examination. Limited examinations for reoccurring indications may be performed as noted.  Right Findings: +----------+------------+---------+-----------+----------+-------+ RIGHT     CompressiblePhasicitySpontaneousPropertiesSummary +----------+------------+---------+-----------+----------+-------+ IJV           Full       Yes       Yes                      +----------+------------+---------+-----------+----------+-------+  Subclavian    Full       Yes       Yes                      +----------+------------+---------+-----------+----------+-------+ Axillary      Full       Yes       Yes                      +----------+------------+---------+-----------+----------+-------+ Brachial      Full       Yes       Yes                      +----------+------------+---------+-----------+----------+-------+ Radial        Full                                          +----------+------------+---------+-----------+----------+-------+ Ulnar         Full                                          +----------+------------+---------+-----------+----------+-------+ Cephalic      Full                                          +----------+------------+---------+-----------+----------+-------+ Basilic        Full                                          +----------+------------+---------+-----------+----------+-------+  Left Findings: +----------+------------+---------+-----------+----------+-------+ LEFT      CompressiblePhasicitySpontaneousPropertiesSummary +----------+------------+---------+-----------+----------+-------+ Subclavian    Full       Yes       Yes                      +----------+------------+---------+-----------+----------+-------+  Summary:  Right: No evidence of deep vein thrombosis in the upper extremity. No evidence of superficial vein thrombosis in the upper extremity.  Left: No evidence of thrombosis in the subclavian.  *See table(s) above for measurements and observations.    Preliminary    [place] EKG SITE RITE  Result Date: 09/14/2020 If [facility] image not attached, placement could not be confirmed due to current cardiac rhythm.  [place] EKG SITE RITE  Result Date: 09/14/2020 If [facility] image not attached, placement could not be confirmed due to current cardiac rhythm.  US Abdomen Limited RUQ (LIVER/GB)  Result Date: 09/10/2020 CLINICAL DATA:  70 year old female with elevated bilirubin. EXAM: ULTRASOUND ABDOMEN LIMITED RIGHT UPPER QUADRANT COMPARISON:  None. FINDINGS: Gallbladder: No gallstones or wall thickening visualized. No sonographic Murphy sign noted by sonographer. Common bile duct: Diameter: 4 mm, normal. Liver: Liver echogenicity appears normal (image 41). No intrahepatic biliary ductal dilatation is evident. No discrete liver lesion identified. Portal vein is patent on color Doppler imaging with normal direction of blood flow towards the liver. Other: No free fluid.  Negative visible right renal upper pole. IMPRESSION: Normal right upper quadrant ultrasound. No explanation for elevated bilirubin. Electronically Signed   By: Odessa Fleming M.D.   On:  09/10/2020 08:55    Microbiology: Recent Results (from the past 240 hour(s))  Resp Panel by RT-PCR (Flu A&B,  Covid) Nasopharyngeal Swab     Status: None   Collection Time: 09/09/20  2:56 PM   Specimen: Nasopharyngeal Swab; Nasopharyngeal(NP) swabs in vial transport medium  Result Value Ref Range Status   SARS Coronavirus 2 by RT PCR NEGATIVE NEGATIVE Final    Comment: (NOTE) SARS-CoV-2 target nucleic acids are NOT DETECTED.  The SARS-CoV-2 RNA is generally detectable in upper respiratory specimens during the acute phase of infection. The lowest concentration of SARS-CoV-2 viral copies this assay can detect is 138 copies/mL. A negative result does not preclude SARS-Cov-2 infection and should not be used as the sole basis for treatment or other patient management decisions. A negative result may occur with  improper specimen collection/handling, submission of specimen other than nasopharyngeal swab, presence of viral mutation(s) within the areas targeted by this assay, and inadequate number of viral copies(<138 copies/mL). A negative result must be combined with clinical observations, patient history, and epidemiological information. The expected result is Negative.  Fact Sheet for Patients:  BloggerCourse.com  Fact Sheet for Healthcare Providers:  SeriousBroker.it  This test is no t yet approved or cleared by the [place] FDA and  has been authorized for detection and/or diagnosis of SARS-CoV-2 by FDA under an Emergency Use Authorization (EUA). This EUA will remain  in effect (meaning this test can be used) for the duration of the COVID-19 declaration under Section 564(b)(1) of the Act, 21 U.S.C.section 360bbb-3(b)(1), unless the authorization is terminated  or revoked sooner.       Influenza A by PCR NEGATIVE NEGATIVE Final   Influenza B by PCR NEGATIVE NEGATIVE Final    Comment: (NOTE) The Xpert Xpress SARS-CoV-2/FLU/RSV plus assay is intended as an aid in the diagnosis of influenza from Nasopharyngeal swab specimens and should  not be used as a sole basis for treatment. Nasal washings and aspirates are unacceptable for Xpert Xpress SARS-CoV-2/FLU/RSV testing.  Fact Sheet for Patients: BloggerCourse.com  Fact Sheet for Healthcare Providers: SeriousBroker.it  This test is not yet approved or cleared by the [place] FDA and has been authorized for detection and/or diagnosis of SARS-CoV-2 by FDA under an Emergency Use Authorization (EUA). This EUA will remain in effect (meaning this test can be used) for the duration of the COVID-19 declaration under Section 564(b)(1) of the Act, 21 U.S.C. section 360bbb-3(b)(1), unless the authorization is terminated or revoked.  Performed at [facility], [address] [place], [place] [address]   Culture, blood (routine x 2)     Status: None   Collection Time: 09/09/20  3:30 PM   Specimen: Right Antecubital; Blood  Result Value Ref Range Status   Specimen Description   Final    RIGHT ANTECUBITAL Performed at [facility], [address] [place], [place] [address]    Special Requests   Final    BOTTLES DRAWN AEROBIC AND ANAEROBIC Blood Culture adequate volume Performed at [facility], [address] [place], [place] [address]    Culture   Final    NO GROWTH 5 DAYS Performed at [facility], [address] [place], [place] [address]    Report Status 09/14/2020 FINAL  Final  Culture, blood (routine x 2)     Status: None   Collection Time: 09/09/20  3:40 PM   Specimen: Left Antecubital; Blood  Result Value Ref Range  Status   Specimen Description   Final    LEFT ANTECUBITAL Performed at [facility], [address] [place], [place] [address]    Special Requests   Final    BOTTLES DRAWN AEROBIC AND ANAEROBIC Blood Culture adequate volume Performed at [facility], [address] [place], [place] [address]    Culture   Final    NO GROWTH 5  DAYS Performed at [facility], [address] [place], [place] [address]    Report Status 09/14/2020 FINAL  Final  Urine culture     Status: Abnormal   Collection Time: 09/09/20  6:25 PM   Specimen: Urine, Clean Catch  Result Value Ref Range Status   Specimen Description   Final    URINE, CLEAN CATCH Performed at [facility], [address] [place] [employer] [place], [place] [address]    Special Requests   Final    NONE Performed at [facility], [address] [place], [place] [address]    Culture (A)  Final    60,000 COLONIES/mL STAPHYLOCOCCUS AUREUS 10,000 COLONIES/mL GROUP B STREP(S.AGALACTIAE)ISOLATED TESTING AGAINST S. AGALACTIAE NOT ROUTINELY PERFORMED DUE TO PREDICTABILITY OF AMP/PEN/VAN SUSCEPTIBILITY. Performed at [facility], [address] [place], [place] [address]    Report Status 09/12/2020 FINAL  Final   Organism ID, Bacteria STAPHYLOCOCCUS AUREUS (A)  Final      Susceptibility   Staphylococcus aureus - MIC*    CIPROFLOXACIN <=0.5 SENSITIVE Sensitive     GENTAMICIN <=0.5 SENSITIVE Sensitive     NITROFURANTOIN <=16 SENSITIVE Sensitive     OXACILLIN 0.5 SENSITIVE Sensitive     TETRACYCLINE <=1 SENSITIVE Sensitive     VANCOMYCIN 1 SENSITIVE Sensitive     TRIMETH/SULFA <=10 SENSITIVE Sensitive     CLINDAMYCIN <=0.25 SENSITIVE Sensitive     RIFAMPIN <=0.5 SENSITIVE Sensitive     Inducible Clindamycin NEGATIVE Sensitive     * 60,000 COLONIES/mL STAPHYLOCOCCUS AUREUS  MRSA PCR Screening     Status: None   Collection Time: 09/10/20  1:13 AM   Specimen: Nasal Mucosa; Nasopharyngeal  Result Value Ref Range Status   MRSA by PCR NEGATIVE NEGATIVE Final    Comment:        The GeneXpert MRSA Assay (FDA approved for NASAL specimens only), is one component of a comprehensive MRSA colonization surveillance program. It is not intended to diagnose MRSA infection nor to guide or monitor treatment for MRSA infections. Performed at [facility], [address], [place], [place] [address]      Labs: Basic Metabolic Panel: Recent Labs  Lab 09/14/20 0057 09/15/20 0330 09/16/20 1014 09/17/20 0313 09/18/20 0400  NA 151* 143 139 139 135  K 3.1* 2.6* 2.6* 2.8* 2.8*  CL 119* 116* 109 109 104  CO2 19* 20* 21* 18* 22  GLUCOSE 105* 97 91 76 83  BUN 31* CREATININE 0.72 0.57 0.60 0.62 0.47  CALCIUM 8.5* 7.6* 7.4* 7.3* 7.1*  MG  --  1.7  --   --   --   PHOS  --  2.4*  --   --   --    Liver Function Tests: Recent Labs  Lab 09/12/20 0550 09/13/20 0539 09/14/20 0057 09/15/20 0330 09/17/20 0313  AST 49* 17 17 12* 17  ALT 35 ALKPHOS 133* 109 136* 105 116  BILITOT 4.0* 3.3* 4.9* 2.9* 2.2*  PROT 5.9* 5.6* 6.5 4.7* 5.0*  ALBUMIN 2.1* 2.0* 2.4* 1.7* 1.8*   No results for input(s): LIPASE, AMYLASE in the last 168 hours. No results for input(s): AMMONIA in the last 168 hours. CBC: Recent Labs  Lab 09/12/20 0550 09/13/20 0539 09/14/20 0057 09/15/20 0330 09/16/20 1014 09/17/20 0313 09/18/20 0400  WBC 0.7* 0.7* 0.8* 1.1* 1.9* 2.5* 4.0  NEUTROABS 0.0* 0.0* 0.1* 0.2*  --  0.5*  --   HGB 7.6* 7.1* 9.8* 6.6* 8.7* 8.5* 8.2*  HCT 24.2* 23.5* 31.0* 21.0* 26.5* 25.7* 24.8*  MCV 94.2 96.3 93.7 93.3 88.3 88.9 88.6  PLT 10* 17* 14* 8* 28* 26* 42*   Cardiac Enzymes: No results for input(s): CKTOTAL, CKMB, CKMBINDEX, TROPONINI in the last 168 hours. BNP: BNP (last 3 results) No results for input(s): BNP in the last 8760 hours.  ProBNP (last 3 results) No results for input(s): PROBNP in the last 8760 hours.  CBG: No results for input(s): GLUCAP in the last 168 hours.     Signed:  Rhetta Mura MD   [facility] 09/18/2020, 9:28 AM

## 2020-09-18 NOTE — TOC Transition Note (Signed)
Transition of Care [facility]) - CM/SW Discharge Note   Patient Details  Name: Kim Ponce MRN: [id number] Date of Birth: [birth year]  Transition of Care [facility]) CM/SW Contact:  Lanier Clam, RN Phone Number: 09/18/2020, 12:33 PM   Clinical Narrative:d/c home. No further CM needs.       Final next level of care: Home/Self Care Barriers to Discharge: No Barriers Identified   Patient Goals and CMS Choice Patient states their goals for this hospitalization and ongoing recovery are:: home CMS Medicare.gov Compare Post Acute Care list provided to:: Patient    Discharge Placement                       Discharge Plan and Services   Discharge Planning Services: CM Consult                                 Social Determinants of Health (SDOH) Interventions     Readmission Risk Interventions No flowsheet data found.

## 2020-09-30 ENCOUNTER — Telehealth: Payer: Self-pay | Admitting: Hematology and Oncology

## 2020-09-30 NOTE — Telephone Encounter (Signed)
Kim Ponce has been cld and scheduled to see Dr. Bertis Ruddy on 2/10 at 2:20pm w/labs at 145pm. She was seen in the hospital by MD as a hematology consult. Pt aware to arrive 20 minutes early.

## 2020-10-02 ENCOUNTER — Other Ambulatory Visit: Payer: Self-pay | Admitting: Hematology and Oncology

## 2020-10-02 DIAGNOSIS — D61818 Other pancytopenia: Secondary | ICD-10-CM

## 2020-10-03 ENCOUNTER — Inpatient Hospital Stay: Payer: Self-pay

## 2020-10-03 ENCOUNTER — Other Ambulatory Visit: Payer: Self-pay

## 2020-10-03 ENCOUNTER — Inpatient Hospital Stay: Payer: Self-pay | Attending: Hematology and Oncology | Admitting: Hematology and Oncology

## 2020-10-03 ENCOUNTER — Encounter: Payer: Self-pay | Admitting: Hematology and Oncology

## 2020-10-03 DIAGNOSIS — L659 Nonscarring hair loss, unspecified: Secondary | ICD-10-CM | POA: Insufficient documentation

## 2020-10-03 DIAGNOSIS — D61818 Other pancytopenia: Secondary | ICD-10-CM

## 2020-10-03 DIAGNOSIS — Z882 Allergy status to sulfonamides status: Secondary | ICD-10-CM | POA: Insufficient documentation

## 2020-10-03 DIAGNOSIS — M069 Rheumatoid arthritis, unspecified: Secondary | ICD-10-CM | POA: Insufficient documentation

## 2020-10-03 DIAGNOSIS — R21 Rash and other nonspecific skin eruption: Secondary | ICD-10-CM | POA: Insufficient documentation

## 2020-10-03 DIAGNOSIS — R300 Dysuria: Secondary | ICD-10-CM | POA: Insufficient documentation

## 2020-10-03 DIAGNOSIS — Z87440 Personal history of urinary (tract) infections: Secondary | ICD-10-CM | POA: Insufficient documentation

## 2020-10-03 DIAGNOSIS — R509 Fever, unspecified: Secondary | ICD-10-CM | POA: Insufficient documentation

## 2020-10-03 DIAGNOSIS — Z79899 Other long term (current) drug therapy: Secondary | ICD-10-CM | POA: Insufficient documentation

## 2020-10-03 DIAGNOSIS — Z885 Allergy status to narcotic agent status: Secondary | ICD-10-CM | POA: Insufficient documentation

## 2020-10-03 DIAGNOSIS — K1230 Oral mucositis (ulcerative), unspecified: Secondary | ICD-10-CM | POA: Insufficient documentation

## 2020-10-03 DIAGNOSIS — Z880 Allergy status to penicillin: Secondary | ICD-10-CM | POA: Insufficient documentation

## 2020-10-03 LAB — CBC WITH DIFFERENTIAL/PLATELET
Abs Immature Granulocytes: 0.2 10*3/uL — ABNORMAL HIGH (ref 0.00–0.07)
Basophils Absolute: 0.2 10*3/uL — ABNORMAL HIGH (ref 0.0–0.1)
Basophils Relative: 2 %
Eosinophils Absolute: 0 10*3/uL (ref 0.0–0.5)
Eosinophils Relative: 0 %
HCT: 30.9 % — ABNORMAL LOW (ref 36.0–46.0)
Hemoglobin: 9.6 g/dL — ABNORMAL LOW (ref 12.0–15.0)
Immature Granulocytes: 2 %
Lymphocytes Relative: 30 %
Lymphs Abs: 3 10*3/uL (ref 0.7–4.0)
MCH: 30.4 pg (ref 26.0–34.0)
MCHC: 31.1 g/dL (ref 30.0–36.0)
MCV: 97.8 fL (ref 80.0–100.0)
Monocytes Absolute: 0.8 10*3/uL (ref 0.1–1.0)
Monocytes Relative: 8 %
Neutro Abs: 5.8 10*3/uL (ref 1.7–7.7)
Neutrophils Relative %: 58 %
Platelets: 502 10*3/uL — ABNORMAL HIGH (ref 150–400)
RBC: 3.16 MIL/uL — ABNORMAL LOW (ref 3.87–5.11)
RDW: 19.5 % — ABNORMAL HIGH (ref 11.5–15.5)
WBC: 10.1 10*3/uL (ref 4.0–10.5)
nRBC: 0 % (ref 0.0–0.2)

## 2020-10-03 LAB — SAMPLE TO BLOOD BANK

## 2020-10-03 NOTE — Assessment & Plan Note (Signed)
She has very minimal residual blister in her mouth Her sensation of pain/mucositis has resolved I recommend her to continue aggressive vitamin supplementation

## 2020-10-03 NOTE — Assessment & Plan Note (Signed)
Her recent severe pancytopenia was due to methotrexate toxicity Since her methotrexate was placed on hold, along with aggressive vitamin supplementation, her pancytopenia are resolving Her leukopenia has completely resolved Her platelet count is now high, likely due to a component of reactive thrombocytosis Her anemia is improving She will continue folic acid and vitamin B12 supplementation

## 2020-10-03 NOTE — Progress Notes (Signed)
[place] OFFICE PROGRESS NOTE  Patient, No Pcp Per  ASSESSMENT & PLAN:  Pancytopenia, acquired ([place]) Her recent severe pancytopenia was due to methotrexate toxicity Since her methotrexate was placed on hold, along with aggressive vitamin supplementation, her pancytopenia are resolving Her leukopenia has completely resolved Her platelet count is now high, likely due to a component of reactive thrombocytosis Her anemia is improving She will continue folic acid and vitamin B12 supplementation  Mucositis She has very minimal residual blister in her mouth Her sensation of pain/mucositis has resolved I recommend her to continue aggressive vitamin supplementation  Alopecia Her recent alopecia is most consistent with recent methotrexate toxicity Her hair should grow back in the near future  Rheumatoid arthritis [facility]) She is not too bothered by her rheumatoid arthritis I would defer to her rheumatologist for further management   No orders of the defined types were placed in this encounter.   The total time spent in the appointment was 20 minutes encounter with patients including review of chart and various tests results, discussions about plan of care and coordination of care plan   All questions were answered. The patient knows to call the clinic with any problems, questions or concerns. No barriers to learning was detected.    Heath Lark, MD 2/10/20221:59 PM  INTERVAL HISTORY: Kim Ponce 70 y.o. female returns for hospital follow-up regarding recent severe pancytopenia and mucositis secondary to methotrexate toxicity I saw her in the hospital around September 10, 2020 for evaluation of severe pancytopenia with neutropenic fever Since recent discharge from the hospital, she is compliant taking her vitamin supplementation and medication for mucositis Her mucositis has completely resolved The patient denies any recent signs or symptoms of bleeding such as  spontaneous epistaxis, hematuria or hematochezia. She denies recent fever or chills Her UTI has resolved She saw her rheumatologist recently who recommended she makes an appointment for follow-up on her recent pancytopenia  SUMMARY OF HEMATOLOGIC HISTORY:  Kim Ponce is a 70 year old female with a past medical history significant for rheumatoid and osteoarthritis and anemia.  The patient was seen in the emergency room due to fever.  She was previously seen in the ED for mouth sores, rash on her back, and UTI 09/05/2020.  It was thought that her rash was secondary to a reaction from methotrexate and she was advised to stop taking it.  The patient had been on methotrexate for her rheumatoid arthritis but stopped it for several months but recently started taking it again about 2 weeks ago.  She was given Macrobid for her UTI symptoms.  She presented to the emergency room again today due to worsening sores in her mouth and difficulty swallowing.  Reports improvement in her UTI symptoms.  She was noted to have a fever of 101.7 in the ER.  A CBC with differential was drawn in the emergency room which showed a WBC of less than 0.1, ANC was 0.0, absolute lymphocytes 0.2, and absolute monocyte 0.0, hemoglobin 10.2, hematocrit 31.2, and platelet count of 49,000.  Her most recent CBC available to me was before on 12/26/2019 and at that time her WBC was 8.2, hemoglobin was 8.8, and platelets 199,000.  Her BUN was 46, creatinine 1.19 (appears baseline is between 0.5-0.6), alk phos 214, T bili 6.9.  The patient had an abdominal ultrasound performed earlier this morning which was a normal right upper quadrant ultrasound and no explanation for elevated bilirubin. The patient is having significant pain from oral  mucositis.  She also reports a small amount of bleeding from her mouth ulcers.  Denies epistaxis and hematuria.  She is unable to eat secondary to mucositis.  She has a rash on her back and several sores on her vulva.   The patient tells me that she has taken methotrexate in the past and was off of it for several months.  She recently restarted methotrexate 20 mg/week dose.  The patient was uncertain as to the exact date that she started it and how many doses that she is taken.  However, pharmacy review indicates that the prescription was filled on 08/30/2020.  She was seen in the emergency room on 09/05/2020 and was advised to stop taking methotrexate at that time.  Therefore, I estimate that she took 1 dose of methotrexate on around 08/30/2020.  The patient did not notice any fevers or chills at home but was noted to be febrile in the emergency room.  She continues to have dysuria.  She denies headaches, dizziness, chest pain, shortness of breath.  Hematology was asked to see the patient to make recommendations regarding her pancytopenia.  Throughout her hospitalization, she received aggressive transfusion support, broad-spectrum IV antibiotics for UTI, growth factor support and aggressive vitamin supplementation.  She was subsequently discharged home  I have reviewed the past medical history, past surgical history, social history and family history with the patient and they are unchanged from previous note.  ALLERGIES:  is allergic to morphine and related, penicillins, sulfa antibiotics, and codeine.  MEDICATIONS:  Current Outpatient Medications  Medication Sig Dispense Refill  . folic acid (FOLVITE) 1 MG tablet Take 1 tablet (1 mg total) by mouth daily. 30 tablet 2  . HYDROcodone-acetaminophen (NORCO/VICODIN) 5-325 MG tablet Take 1 tablet by mouth 3 (three) times daily as needed for pain.    . Multiple Vitamin (MULTIVITAMIN WITH MINERALS) TABS tablet Take 1 tablet by mouth daily. 30 tablet 2  . pantoprazole (PROTONIX) 40 MG tablet Take 1 tablet (40 mg total) by mouth daily. 30 tablet 1  . sucralfate (CARAFATE) 1 g tablet Take 1 tablet (1 g total) by mouth 4 (four) times daily. 120 tablet 1  . vitamin B-12 1000 MCG  tablet Take 1 tablet (1,000 mcg total) by mouth daily. 30 tablet 2   No current facility-administered medications for this visit.     REVIEW OF SYSTEMS:   Constitutional: Denies fevers, chills or night sweats Eyes: Denies blurriness of vision Ears, nose, mouth, throat, and face: Denies mucositis or sore throat Respiratory: Denies cough, dyspnea or wheezes Cardiovascular: Denies palpitation, chest discomfort or lower extremity swelling Gastrointestinal:  Denies nausea, heartburn or change in bowel habits Skin: Denies abnormal skin rashes Lymphatics: Denies new lymphadenopathy or easy bruising Neurological:Denies numbness, tingling or new weaknesses Behavioral/Psych: Mood is stable, no new changes  All other systems were reviewed with the patient and are negative.  PHYSICAL EXAMINATION: ECOG PERFORMANCE STATUS: 0 - Asymptomatic  Vitals:   10/03/20 1338  BP: 116/75  Pulse: 85  Resp: 18  Temp: 99.5 F (37.5 C)  SpO2: 98%   Filed Weights   10/03/20 1338  Weight: 115 lb 6.4 oz (52.3 kg)    GENERAL:alert, no distress and comfortable.  Noted alopecia OROPHARYNX: She has no signs of oral bleeding.  Her mucositis has almost completely resolved.  Her previous lip blister has healed NEURO: alert & oriented x 3 with fluent speech, no focal motor/sensory deficits  LABORATORY DATA:  I have reviewed the data as listed  Component Value Date/Time   NA 135 09/18/2020 0400   K 2.8 (L) 09/18/2020 0400   CL 104 09/18/2020 0400   CO2 22 09/18/2020 0400   GLUCOSE 83 09/18/2020 0400   BUN 8 09/18/2020 0400   CREATININE 0.47 09/18/2020 0400   CALCIUM 7.1 (L) 09/18/2020 0400   PROT 5.0 (L) 09/17/2020 0313   ALBUMIN 1.8 (L) 09/17/2020 0313   AST 17 09/17/2020 0313   ALT 12 09/17/2020 0313   ALKPHOS 116 09/17/2020 0313   BILITOT 2.2 (H) 09/17/2020 0313   GFRNONAA >60 09/18/2020 0400   GFRAA >60 12/26/2019 0321    No results found for: SPEP, UPEP  Lab Results  Component Value  Date   WBC 10.1 10/03/2020   NEUTROABS 5.8 10/03/2020   HGB 9.6 (L) 10/03/2020   HCT 30.9 (L) 10/03/2020   MCV 97.8 10/03/2020   PLT 502 (H) 10/03/2020      Chemistry      Component Value Date/Time   NA 135 09/18/2020 0400   K 2.8 (L) 09/18/2020 0400   CL 104 09/18/2020 0400   CO2 22 09/18/2020 0400   BUN 8 09/18/2020 0400   CREATININE 0.47 09/18/2020 0400      Component Value Date/Time   CALCIUM 7.1 (L) 09/18/2020 0400   ALKPHOS 116 09/17/2020 0313   AST 17 09/17/2020 0313   ALT 12 09/17/2020 0313   BILITOT 2.2 (H) 09/17/2020 1157

## 2020-10-03 NOTE — Assessment & Plan Note (Signed)
She is not too bothered by her rheumatoid arthritis I would defer to her rheumatologist for further management

## 2020-10-03 NOTE — Assessment & Plan Note (Signed)
Her recent alopecia is most consistent with recent methotrexate toxicity Her hair should grow back in the near future

## 2022-08-09 IMAGING — US US ABDOMEN LIMITED
1 series · 14 of 25 positions shown · non-contrast
Comparison: None.

CLINICAL DATA: 69-year-old female with elevated bilirubin.

EXAM:
ULTRASOUND ABDOMEN LIMITED RIGHT UPPER QUADRANT

[Series 1: us abdomen limited · 14 of 41 slices shown]
[im 1/41]
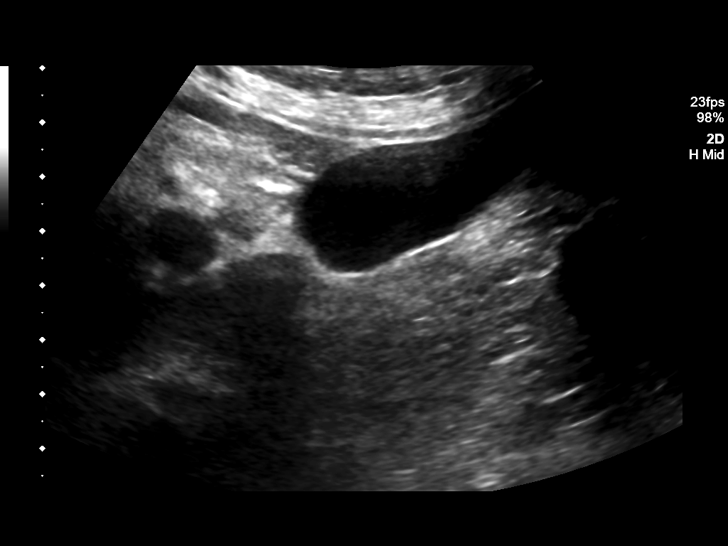
[im 4/41]
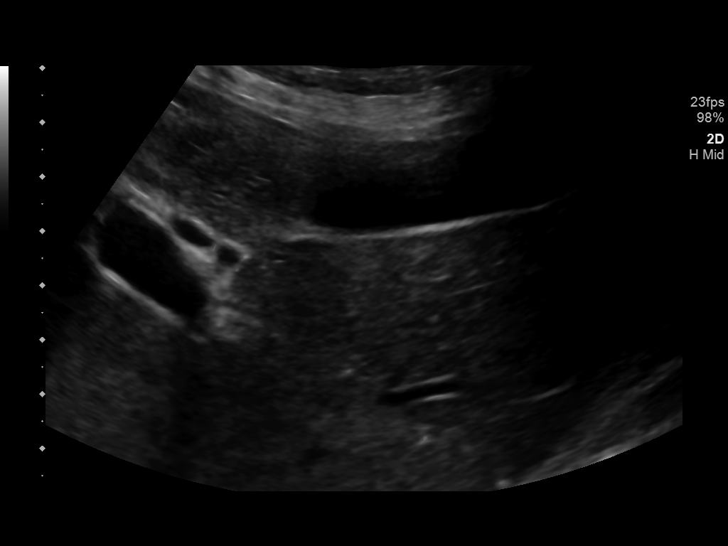
[im 7/41]
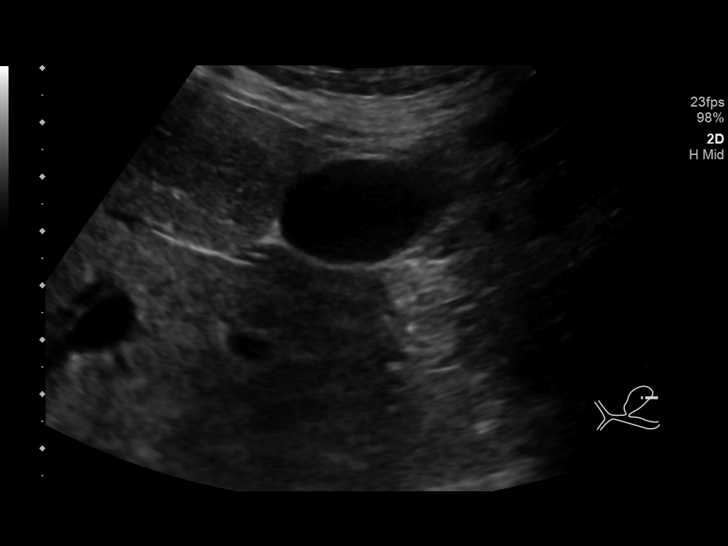
[im 11/41]
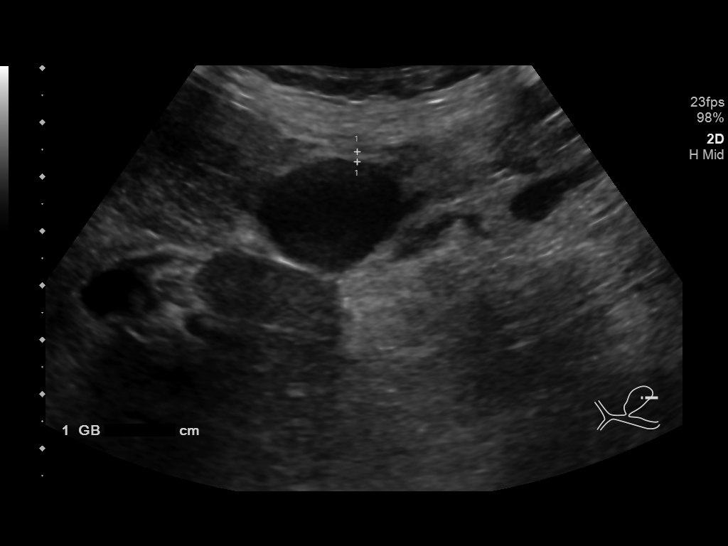
[im 14/41]
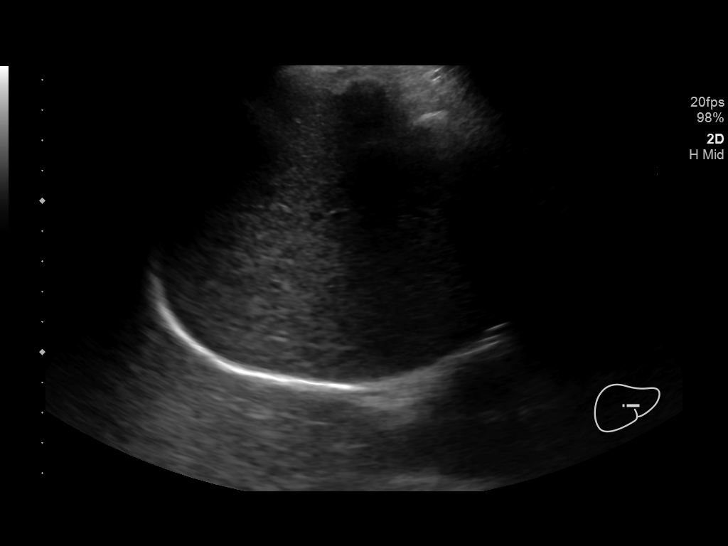
[im 16/41]
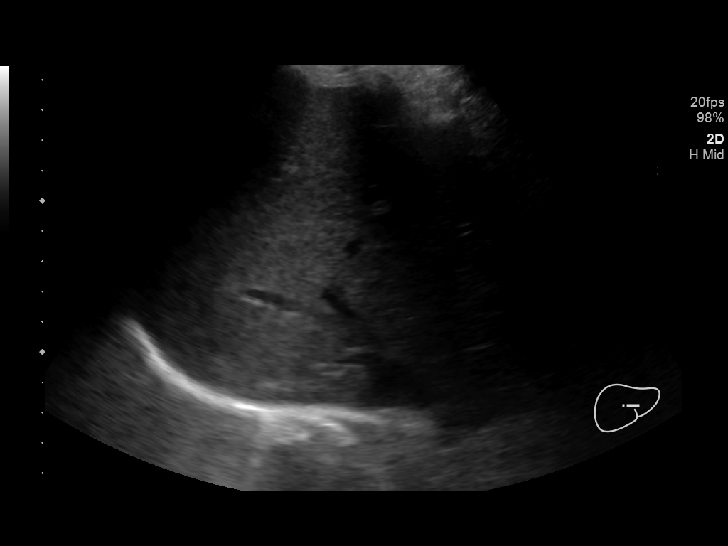
[im 19/41]
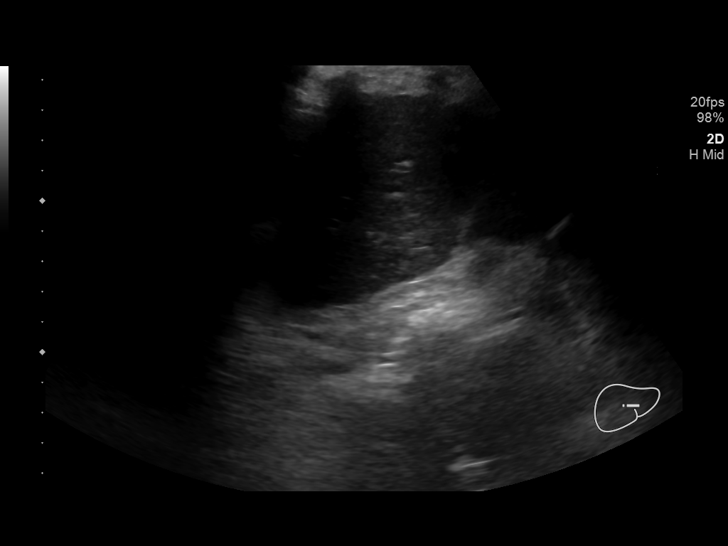
[im 22/41]
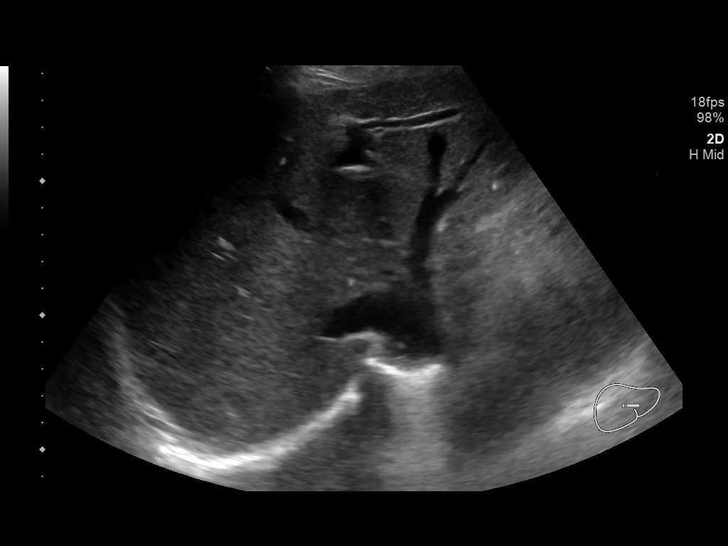
[im 26/41]
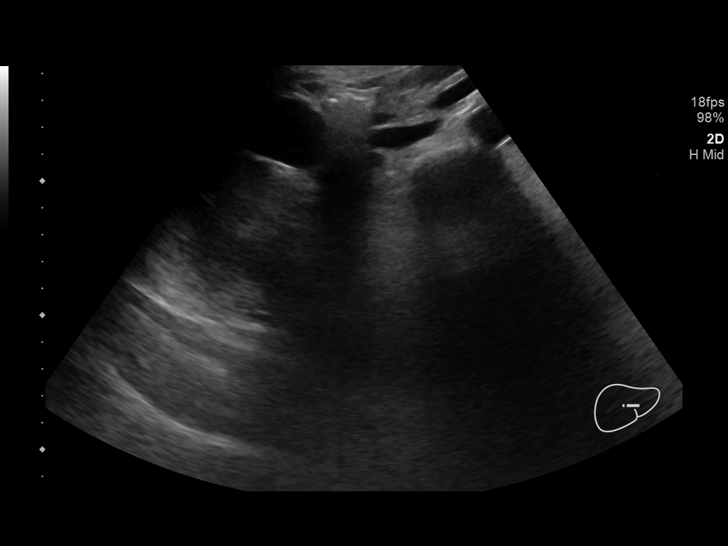
[im 27/41]
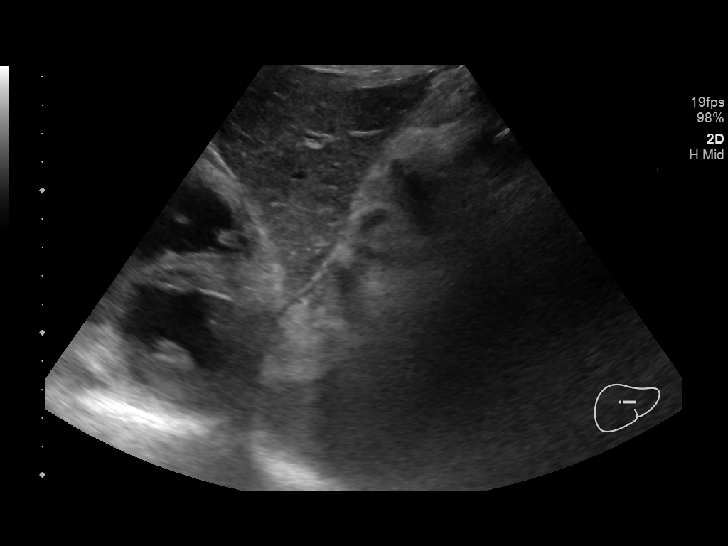
[im 31/41]
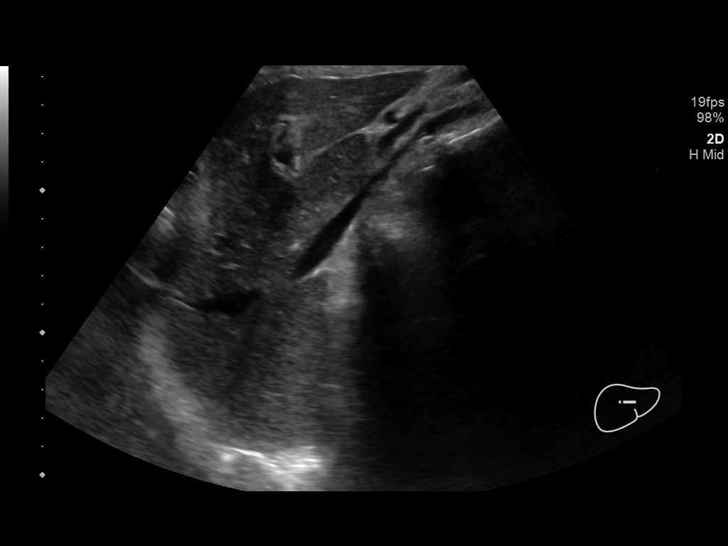
[im 34/41]
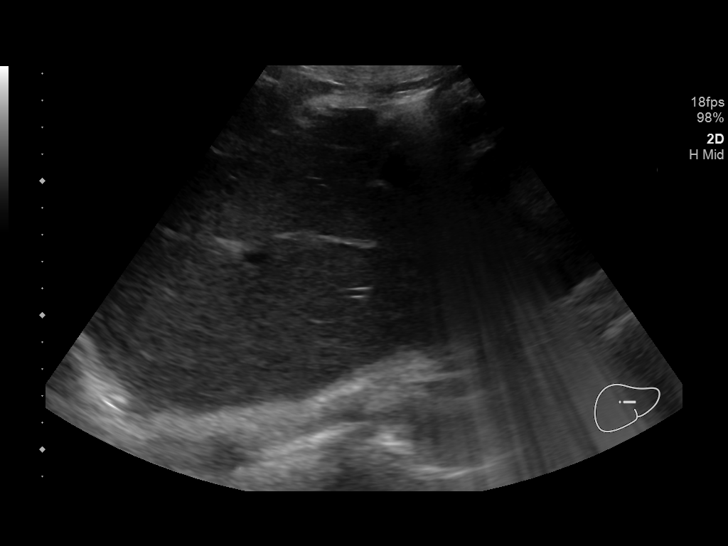
[im 37/41]
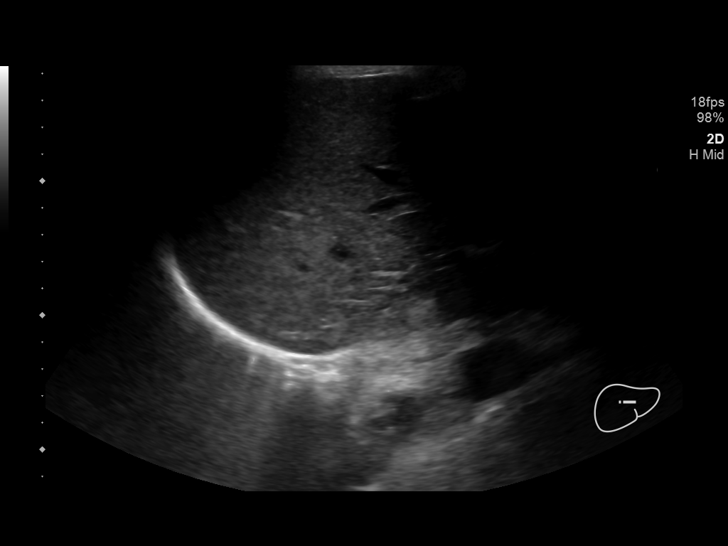
[im 41/41]
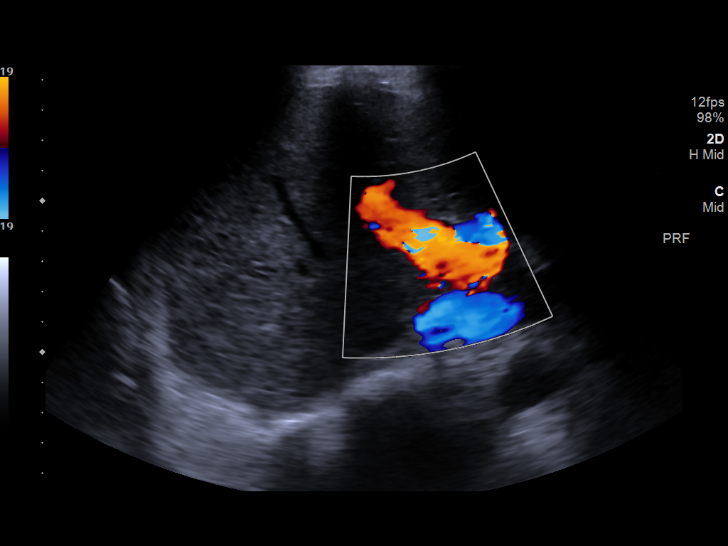

[14 of 25 positions shown; findings below may reference images not displayed]

FINDINGS: Gallbladder:

No gallstones or wall thickening visualized. No sonographic Murphy
sign noted by sonographer.

Common bile duct:

Diameter: 4 mm, normal.

Liver:

Liver echogenicity appears normal (image 41). No intrahepatic
biliary ductal dilatation is evident. No discrete liver lesion
identified. Portal vein is patent on color Doppler imaging with
normal direction of blood flow towards the liver.

Other: No free fluid.  Negative visible right renal upper pole.
IMPRESSION: Normal right upper quadrant ultrasound. No explanation for elevated
bilirubin.

## 2024-05-23 ENCOUNTER — Emergency Department (HOSPITAL_COMMUNITY): Payer: Self-pay

## 2024-05-23 ENCOUNTER — Encounter (HOSPITAL_COMMUNITY): Payer: Self-pay

## 2024-05-23 ENCOUNTER — Other Ambulatory Visit: Payer: Self-pay

## 2024-05-23 ENCOUNTER — Emergency Department (HOSPITAL_COMMUNITY)
Admission: EM | Admit: 2024-05-23 | Discharge: 2024-05-24 | Disposition: A | Discharge status: Home / Self Care | Payer: Self-pay

## 2024-05-23 ENCOUNTER — Ambulatory Visit
Admission: EM | Admit: 2024-05-23 | Discharge: 2024-05-23 | Disposition: A | Discharge status: Other Acute Inpatient Hospital | Payer: Self-pay | Attending: Family Medicine | Admitting: Family Medicine

## 2024-05-23 DIAGNOSIS — R630 Anorexia: Secondary | ICD-10-CM | POA: Insufficient documentation

## 2024-05-23 DIAGNOSIS — R079 Chest pain, unspecified: Secondary | ICD-10-CM

## 2024-05-23 DIAGNOSIS — R0789 Other chest pain: Secondary | ICD-10-CM | POA: Insufficient documentation

## 2024-05-23 DIAGNOSIS — M25512 Pain in left shoulder: Secondary | ICD-10-CM | POA: Insufficient documentation

## 2024-05-23 DIAGNOSIS — B029 Zoster without complications: Secondary | ICD-10-CM

## 2024-05-23 DIAGNOSIS — R0602 Shortness of breath: Secondary | ICD-10-CM

## 2024-05-23 DIAGNOSIS — R059 Cough, unspecified: Secondary | ICD-10-CM | POA: Insufficient documentation

## 2024-05-23 LAB — RESP PANEL BY RT-PCR (RSV, FLU A&B, COVID)  RVPGX2
Influenza A by PCR: NEGATIVE
Influenza B by PCR: NEGATIVE
Resp Syncytial Virus by PCR: NEGATIVE
SARS Coronavirus 2 by RT PCR: NEGATIVE

## 2024-05-23 LAB — CBC
HCT: 43.1 % (ref 36.0–46.0)
Hemoglobin: 14.8 g/dL (ref 12.0–15.0)
MCH: 34.2 pg — ABNORMAL HIGH (ref 26.0–34.0)
MCHC: 34.3 g/dL (ref 30.0–36.0)
MCV: 99.5 fL (ref 80.0–100.0)
Platelets: 205 K/uL (ref 150–400)
RBC: 4.33 MIL/uL (ref 3.87–5.11)
RDW: 13.5 % (ref 11.5–15.5)
WBC: 5.1 K/uL (ref 4.0–10.5)
nRBC: 0 % (ref 0.0–0.2)

## 2024-05-23 LAB — BASIC METABOLIC PANEL WITH GFR
Anion gap: 15 (ref 5–15)
BUN: 22 mg/dL (ref 8–23)
CO2: 16 mmol/L — ABNORMAL LOW (ref 22–32)
Calcium: 8.7 mg/dL — ABNORMAL LOW (ref 8.9–10.3)
Chloride: 105 mmol/L (ref 98–111)
Creatinine, Ser: 1.05 mg/dL — ABNORMAL HIGH (ref 0.44–1.00)
GFR, Estimated: 56 mL/min — ABNORMAL LOW (ref >60)
Glucose, Bld: 90 mg/dL (ref 70–99)
Potassium: 3.6 mmol/L (ref 3.5–5.1)
Sodium: 136 mmol/L (ref 135–145)

## 2024-05-23 LAB — TROPONIN I (HIGH SENSITIVITY)
Troponin I (High Sensitivity): 7 ng/L (ref <18)
Troponin I (High Sensitivity): 9 ng/L (ref <18)

## 2024-05-23 MED ORDER — HYDROCODONE-ACETAMINOPHEN 5-325 MG PO TABS: 2.0000 | ORAL_TABLET | ORAL | 0 refills | Status: AC | PRN

## 2024-05-23 MED ORDER — SODIUM CHLORIDE 0.9 % IV BOLUS
1000.0000 mL | Freq: Once | INTRAVENOUS | Status: AC
  Administered 2024-05-23: 1000 mL via INTRAVENOUS

## 2024-05-23 MED ORDER — FENTANYL CITRATE PF 50 MCG/ML IJ SOSY
50.0000 ug | PREFILLED_SYRINGE | Freq: Once | INTRAMUSCULAR | Status: AC
Start: 2024-05-23 — End: 2024-05-23
  Administered 2024-05-23: 50 ug via INTRAVENOUS
  Filled 2024-05-23: qty 1

## 2024-05-23 MED ORDER — ONDANSETRON HCL 4 MG PO TABS: 4.0000 mg | ORAL_TABLET | Freq: Four times a day (QID) | ORAL | 0 refills | Status: AC

## 2024-05-23 MED ORDER — VALACYCLOVIR HCL 1 G PO TABS: 1000.0000 mg | ORAL_TABLET | Freq: Three times a day (TID) | ORAL | 0 refills | Status: AC

## 2024-05-23 NOTE — ED Notes (Signed)
 Called and placed PT on monitor with CCMD

## 2024-05-23 NOTE — ED Notes (Signed)
 Patient is being discharged from the Urgent Care and sent to the Emergency Department via [facility] . Per Myla Bold, NP, patient is in need of higher level of care due to needing further testing and work up. Patient is aware and verbalizes understanding of plan of care.  Vitals:   05/23/24 1841 05/23/24 1844  BP: (!) 155/95 (!) 160/95  Pulse:    Resp:    Temp:    SpO2:

## 2024-05-23 NOTE — ED Triage Notes (Addendum)
 Pt c/o N/V, mid back pain, left sided chest pain, SOB and rash on left side of abdomenx5d.

## 2024-05-23 NOTE — Discharge Instructions (Addendum)
 Please follow-up with your primary care regarding progression of your shingles case.  Continue use prescribed medications, follow-up with ED should you have any worsening of your symptoms prior to your ability to follow-up with your primary care.  You may follow-up with your primary doctor or on MyChart for your varicella PCR results.

## 2024-05-23 NOTE — ED Triage Notes (Signed)
 Coming from UC with n/v/near syncope x 5 days. Unable to eat/drink. Also with left sided back pain. Today the pain radiated from her back to the front of her chest but presently pain free.   500 NS  4mg  zofran  en route with some relief.

## 2024-05-23 NOTE — Discharge Instructions (Addendum)
Patient taken to the ER via EMS

## 2024-05-23 NOTE — ED Provider Notes (Signed)
 [place] EMERGENCY DEPARTMENT AT [facility] Provider Note   CSN: [id number] Arrival date & time: 05/23/24  8057     Patient presents with: Chest Pain   Kim Ponce is a 73 y.o. female who presents to the ED out of concern for a rash that has appeared on her anterior and posterior chest, this was preceded by 2 days with chest discomfort, left shoulder discomfort, and cough.  She also has a decreased appetite and has not been tolerating any oral intake without feeling nauseated.  Last oral intake was endorsed 5 days prior.  Denies any nasal congestion or sore throat.  Seen in urgent care for the same, referred to the ED as they were concerned that the patient was dehydrated and also would need cardiac workup.  Previous diagnoses include rheumatoid arthritis, mucositis, pancytopenia.    Chest Pain Associated symptoms: fatigue        Prior to Admission medications   Medication Sig Start Date End Date Taking? Authorizing Provider  HYDROcodone -acetaminophen  (NORCO/VICODIN) 5-325 MG tablet Take 2 tablets by mouth every 4 (four) hours as needed. 05/23/24  Yes Myriam Dorn BROCKS, PA  ondansetron  (ZOFRAN ) 4 MG tablet Take 1 tablet (4 mg total) by mouth every 6 (six) hours. 05/23/24  Yes Myriam Dorn BROCKS, PA  valACYclovir (VALTREX) 1000 MG tablet Take 1 tablet (1,000 mg total) by mouth 3 (three) times daily for 14 days. 05/23/24 06/06/24 Yes Myriam Dorn BROCKS, PA  Multiple Vitamin (MULTIVITAMIN WITH MINERALS) TABS tablet Take 1 tablet by mouth daily. 09/18/20   Samtani, Jai-Gurmukh, MD  pantoprazole  (PROTONIX ) 40 MG tablet Take 1 tablet (40 mg total) by mouth daily. 09/18/20 09/18/21  Samtani, Jai-Gurmukh, MD  sucralfate  (CARAFATE ) 1 g tablet Take 1 tablet (1 g total) by mouth 4 (four) times daily. 09/18/20 09/18/21  Samtani, Jai-Gurmukh, MD  vitamin B-12 1000 MCG tablet Take 1 tablet (1,000 mcg total) by mouth daily. 09/18/20   Samtani, Jai-Gurmukh, MD    Allergies: Morphine   and codeine, Penicillins, Sulfa antibiotics, and Codeine    Review of Systems  Constitutional:  Positive for appetite change and fatigue.  Cardiovascular:  Positive for chest pain.  All other systems reviewed and are negative.   Updated Vital Signs BP (!) 155/83 (BP Location: Right Arm)   Pulse 78   Temp 98.5 F (36.9 C) (Oral)   Resp 18   Ht 5' 6 (1.676 m)   Wt 52.3 kg   SpO2 100%   BMI 18.61 kg/m   Physical Exam Vitals and nursing note reviewed.  Constitutional:      General: She is not in acute distress.    Appearance: Normal appearance.  HENT:     Head: Normocephalic and atraumatic.     Mouth/Throat:     Mouth: Mucous membranes are moist.     Pharynx: Oropharynx is clear.  Eyes:     Extraocular Movements: Extraocular movements intact.     Conjunctiva/sclera: Conjunctivae normal.     Pupils: Pupils are equal, round, and reactive to light.  Cardiovascular:     Rate and Rhythm: Normal rate and regular rhythm.     Pulses: Normal pulses.     Heart sounds: Normal heart sounds. No murmur heard.    No friction rub. No gallop.  Pulmonary:     Effort: Pulmonary effort is normal.     Breath sounds: Normal breath sounds.  Abdominal:     General: Abdomen is flat. Bowel sounds are normal.  Palpations: Abdomen is soft.  Musculoskeletal:        General: Normal range of motion.     Cervical back: Normal range of motion and neck supple.     Right lower leg: No edema.     Left lower leg: No edema.  Skin:    General: Skin is warm and dry.     Capillary Refill: Capillary refill takes less than 2 seconds.     Findings: Erythema and rash present. Rash is macular and papular.     Comments: There is an erythematous and maculopapular rash that extends around the left T6-T7 distribution.  Neurological:     General: No focal deficit present.     Mental Status: She is alert. Mental status is at baseline.  Psychiatric:        Mood and Affect: Mood normal.     (all labs  ordered are listed, but only abnormal results are displayed) Labs Reviewed  CBC - Abnormal; Notable for the following components:      Result Value   MCH 34.2 (*)    All other components within normal limits  BASIC METABOLIC PANEL WITH GFR - Abnormal; Notable for the following components:   CO2 16 (*)    Creatinine, Ser 1.05 (*)    Calcium 8.7 (*)    GFR, Estimated 56 (*)    All other components within normal limits  RESP PANEL BY RT-PCR (RSV, FLU A&B, COVID)  RVPGX2  VARICELLA-ZOSTER BY PCR  URINALYSIS, ROUTINE W REFLEX MICROSCOPIC  TROPONIN I (HIGH SENSITIVITY)  TROPONIN I (HIGH SENSITIVITY)    EKG: None  Radiology: DG Chest 2 View Result Date: 05/23/2024 EXAM: 2 VIEW(S) XRAY OF THE CHEST 05/23/2024 08:11:00 PM COMPARISON: 09/09/2020. CLINICAL HISTORY: chest pain. Nausea, chest pain, weakness x1 week. FINDINGS: LUNGS AND PLEURA: No focal pulmonary opacity. No pulmonary edema. No pleural effusion. No pneumothorax. HEART AND MEDIASTINUM: Mild aortic arch calcification. No acute abnormality of the cardiac and mediastinal silhouettes. BONES AND SOFT TISSUES: Mild thoracic spine multilevel degenerative disc changes. No acute osseous abnormality. IMPRESSION: 1. No acute cardiopulmonary pathology. Electronically signed by: Norman Gatlin MD 05/23/2024 08:17 PM EDT RP Workstation: [id number]     Procedures   Medications Ordered in the ED  fentaNYL  (SUBLIMAZE ) injection 50 mcg (50 mcg Intravenous Given 05/23/24 2145)  sodium chloride  0.9 % bolus 1,000 mL (1,000 mLs Intravenous New Bag/Given 05/23/24 2145)                                    Medical Decision Making Amount and/or Complexity of Data Reviewed Labs: ordered. Radiology: ordered.  Risk Prescription drug management.   Medical Decision Making:   Kim Ponce is a 73 y.o. female who presented to the ED today with chest pain detailed above.    External chart has been reviewed including previous labs, imaging. Patient  placed on continuous vitals and telemetry monitoring while in ED which was reviewed periodically.  Complete initial physical exam performed, notably the patient  was alert and oriented in no apparent distress.  There is an erythematous and maculopapular rash that extends around the T6-T7 distribution of the left side of her thorax, prior remainder of the exam is unremarkable..    Reviewed and confirmed nursing documentation for past medical history, family history, social history.    Initial Assessment:   With the patient's presentation of chest pain, differential does include ACS, also  includes viral syndrome as well as likely herpes zoster infection.   Initial Plan:  Obtain respiratory panel swab to assess for potential viral etiology. Assess varicella zoster PCR secondary to skin findings. Screening labs including CBC and Metabolic panel to evaluate for infectious or metabolic etiology of disease.  Urinalysis with reflex culture ordered to evaluate for UTI or relevant urologic/nephrologic pathology.  CXR to evaluate for structural/infectious intrathoracic pathology.  EKG to evaluate for cardiac pathology Objective evaluation as below reviewed   Initial Study Results:   Laboratory  All laboratory results reviewed without evidence of clinically relevant pathology.   Exceptions include: Elevated creatinine 1.05  EKG EKG was reviewed independently. Rate, rhythm, axis, intervals all examined and without medically relevant abnormality. ST segments without concerns for elevations.    Radiology:  All images reviewed independently. Agree with radiology report at this time.   DG Chest 2 View Result Date: 05/23/2024 EXAM: 2 VIEW(S) XRAY OF THE CHEST 05/23/2024 08:11:00 PM COMPARISON: 09/09/2020. CLINICAL HISTORY: chest pain. Nausea, chest pain, weakness x1 week. FINDINGS: LUNGS AND PLEURA: No focal pulmonary opacity. No pulmonary edema. No pleural effusion. No pneumothorax. HEART AND MEDIASTINUM:  Mild aortic arch calcification. No acute abnormality of the cardiac and mediastinal silhouettes. BONES AND SOFT TISSUES: Mild thoracic spine multilevel degenerative disc changes. No acute osseous abnormality. IMPRESSION: 1. No acute cardiopulmonary pathology. Electronically signed by: Norman Gatlin MD 05/23/2024 08:17 PM EDT RP Workstation: [id number]     Reassessment and Plan:   Acute on results for repeat troponin.  Given physical exam findings suspect this is due to herpes zoster infection and will prescribe course of valacyclovir, also prescribed short course of Vicodin for pain control as well as ondansetron  for control for nausea.  Anticipate discharge at this time given stable troponin.  Care handed off to T. Young, DO.  Her condition has improved post IV fluids as well as post-analgesia.  PO challenge pending at care hand off.       Final diagnoses:  Herpes zoster without complication  Other chest pain    ED Discharge Orders          Ordered    ondansetron  (ZOFRAN ) 4 MG tablet  Every 6 hours        05/23/24 2226    valACYclovir (VALTREX) 1000 MG tablet  3 times daily        05/23/24 2226    HYDROcodone -acetaminophen  (NORCO/VICODIN) 5-325 MG tablet  Every 4 hours PRN        05/23/24 2226               Myriam Dorn BROCKS, PA 05/23/24 2259    Neysa Caron PARAS, DO 05/24/24 0005

## 2024-05-23 NOTE — ED Notes (Signed)
 Pt noted to have a generalized red rash to let flank. Denies pain.

## 2024-05-23 NOTE — ED Provider Notes (Addendum)
 [facility]    CSN: [id number] Arrival date & time: 05/23/24  1807      History   Chief Complaint No chief complaint on file.   HPI Kim Ponce is a 73 y.o. female with past medical history of rheumatoid arthritis presents for chest pain.  Patient reports she has been having nausea and vomiting for about 5 days with onset of a what initially was a left mid back pain that is now developed into a left-sided chest pain.  She does not think the pain radiates but cannot be sure.  It is associated with shortness of breath and palpitations.  She denies any dizziness, syncope, fevers.  Denies history of asthma or smoking.  No recent or current URI symptoms.  States she has not been able to keep any food or fluids down in several days but does states she is still peeing.  Denies any family history of CAD.  Cannot identify any aggravating or alleviating factors of her chest pain.  She also reports that she has a rash on her abdomen that has been there for a few days.  States it is not itchy or painful no other concerns at this time  HPI  Past Medical History:  Diagnosis Date   Anemia    Arthritis    RA and osteoarthritis   Closed fracture of left distal radius    Wears glasses     Patient Active Problem List   Diagnosis Date Noted   Alopecia 10/03/2020   Pancytopenia, acquired (HCC) 09/10/2020   Hypokalemia 09/10/2020   UTI due to extended-spectrum beta lactamase (ESBL) producing Escherichia coli 09/10/2020   Hyperbilirubinemia 09/10/2020   Renal insufficiency 09/10/2020   Prolonged QT interval 09/10/2020   Mucositis 09/10/2020   Rheumatoid arthritis (HCC) 09/10/2020   Neutropenic fever 09/09/2020   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   Osteoarthritis of left hip 12/22/2019   Osteoarthritis of left knee 09/28/2012    Past Surgical History:  Procedure Laterality Date   ABDOMINAL HYSTERECTOMY     BACK SURGERY     lumbar disectomy   CARPAL TUNNEL  RELEASE Left 11/13/2013   Procedure: LEFT CARPAL TUNNEL RELEASE;  Surgeon: Dempsey JINNY Sensor, MD;  Location: [facility];  Service: Orthopedics;  Laterality: Left;   JOINT REPLACEMENT     OPEN REDUCTION INTERNAL FIXATION (ORIF) DISTAL RADIAL FRACTURE Left 04/07/2018   Procedure: OPEN REDUCTION INTERNAL FIXATION (ORIF) DISTAL RADIAL FRACTURE;  Surgeon: Murrell Drivers, MD;  Location: [facility];  Service: Orthopedics;  Laterality: Left;   SHOULDER ARTHROSCOPY WITH ROTATOR CUFF REPAIR Left 11/13/2013   Procedure: LEFT SHOULDER ARTHROSCOPY WITH DISTAL CLAVICLE ACROMIOPLASTY, LABRAL AND PARTIAL ROTATOR CUFF TEAR DEBRIDEMENT, SUBACROMIAL DECOMPRESSION;  Surgeon: Dempsey JINNY Sensor, MD;  Location: [facility];  Service: Orthopedics;  Laterality: Left;   TOTAL HIP ARTHROPLASTY Left 12/25/2019   Procedure: LEFT TOTAL HIP ARTHROPLASTY ANTERIOR APPROACH;  Surgeon: Sensor Dempsey, MD;  Location: [facility];  Service: Orthopedics;  Laterality: Left;   TOTAL KNEE ARTHROPLASTY  09/26/2012   LEFT KNEE           Dr Sensor    TOTAL KNEE ARTHROPLASTY  09/26/2012   Procedure: TOTAL KNEE ARTHROPLASTY;  Surgeon: Dempsey JINNY Sensor, MD;  Location: [facility];  Service: Orthopedics;  Laterality: Left;    OB History   No obstetric history on file.      Home Medications    Prior to Admission medications  Medication Sig Start Date End Date Taking? Authorizing Provider  HYDROcodone -acetaminophen  (NORCO/VICODIN) 5-325 MG tablet Take 1 tablet by mouth 3 (three) times daily as needed for pain. 08/12/20   [provider]  Multiple Vitamin (MULTIVITAMIN WITH MINERALS) TABS tablet Take 1 tablet by mouth daily. 09/18/20   Samtani, Jai-Gurmukh, MD  pantoprazole  (PROTONIX ) 40 MG tablet Take 1 tablet (40 mg total) by mouth daily. 09/18/20 09/18/21  Samtani, Jai-Gurmukh, MD  sucralfate  (CARAFATE ) 1 g tablet Take 1 tablet (1 g total) by mouth 4 (four) times daily. 09/18/20 09/18/21  Samtani, Jai-Gurmukh, MD   vitamin B-12 1000 MCG tablet Take 1 tablet (1,000 mcg total) by mouth daily. 09/18/20   Samtani, Jai-Gurmukh, MD    Family History History reviewed. No pertinent family history.  Social History Social History   Tobacco Use   Smoking status: Some Days    Types: Cigarettes   Smokeless tobacco: Never  Vaping Use   Vaping status: Never Used  Substance Use Topics   Alcohol use: Yes    Comment: occassional   Drug use: No     Allergies   Morphine  and codeine, Penicillins, Sulfa antibiotics, and Codeine   Review of Systems Review of Systems  Respiratory:  Positive for shortness of breath.   Cardiovascular:  Positive for chest pain.     Physical Exam Triage Vital Signs ED Triage Vitals  Encounter Vitals Group     BP 05/23/24 1841 (!) 155/95     Girls Systolic BP Percentile --      Girls Diastolic BP Percentile --      Boys Systolic BP Percentile --      Boys Diastolic BP Percentile --      Pulse Rate 05/23/24 1826 (!) 121     Resp 05/23/24 1826 (!) 24     Temp 05/23/24 1826 98 F (36.7 C)     Temp Source 05/23/24 1826 Oral     SpO2 05/23/24 1826 97 %     Weight --      Height --      Head Circumference --      Peak Flow --      Pain Score 05/23/24 1822 8     Pain Loc --      Pain Education --      Exclude from Growth Chart --    No data found.  Updated Vital Signs BP (!) 160/95 (BP Location: Left Arm)   Pulse (!) 121   Temp 98 F (36.7 C) (Oral)   Resp (!) 24   SpO2 97%   Visual Acuity Right Eye Distance:   Left Eye Distance:   Bilateral Distance:    Right Eye Near:   Left Eye Near:    Bilateral Near:     Physical Exam Vitals and nursing note reviewed.  Constitutional:      General: She is not in acute distress.    Appearance: Normal appearance. She is not ill-appearing.  HENT:     Head: Normocephalic and atraumatic.  Eyes:     Pupils: Pupils are equal, round, and reactive to light.  Cardiovascular:     Rate and Rhythm: Regular rhythm.  Tachycardia present.     Heart sounds: Normal heart sounds.  Pulmonary:     Effort: No respiratory distress.     Breath sounds: No wheezing or rhonchi.     Comments: Tachypneic Skin:    General: Skin is warm and dry.         Comments:  There is a erythematous macular papular rash on the left abdomen that wraps around to the side.  There is also a separate area on the mid back.  Neurological:     General: No focal deficit present.     Mental Status: She is alert and oriented to person, place, and time.  Psychiatric:        Mood and Affect: Mood normal.        Behavior: Behavior normal.      UC Treatments / Results  Labs (all labs ordered are listed, but only abnormal results are displayed) Labs Reviewed - No data to display  EKG   Radiology No results found.  Procedures ED EKG  Date/Time: 05/23/2024 6:49 PM  Performed by: Loreda Myla SAUNDERS, NP Authorized by: Loreda Myla SAUNDERS, NP   ECG interpreted by ED Physician in the absence of a cardiologist: yes   Rate:    ECG rate:  97 Rhythm:    Rhythm: sinus rhythm   Ectopy:    Ectopy: none   QRS:    QRS axis:  Left ST segments:    ST segments:  Normal T waves:    T waves: non-specific    (including critical care time)  Medications Ordered in UC Medications - No data to display  Initial Impression / Assessment and Plan / UC Course  I have reviewed the triage vital signs and the nursing notes.  Pertinent labs & imaging results that were available during my care of the patient were reviewed by me and considered in my medical decision making (see chart for details).     I reviewed exam and symptoms with patient.  Discussed limitations and abilities of urgent care.  Patient presenting with chest pain and likely underlying dehydration.  Given her presentation and EKG advise she go to the emergency room for further evaluation and treatment.  As she is actively having chest pain advised EMS transfer which she was in agreement  with.  Patient was taken via EMS to the ER. Final Clinical Impressions(s) / UC Diagnoses   Final diagnoses:  Chest pain, unspecified type  Shortness of breath  Herpes zoster without complication     Discharge Instructions      Patient taken to the ER via EMS     ED Prescriptions   None    PDMP not reviewed this encounter.   Loreda Myla SAUNDERS, NP 05/23/24 1900    Loreda Myla SAUNDERS, NP 05/23/24 1900

## 2024-05-25 LAB — VARICELLA-ZOSTER BY PCR: Varicella-Zoster, PCR: POSITIVE — AB

## 2024-05-26 ENCOUNTER — Ambulatory Visit (HOSPITAL_COMMUNITY): Payer: Self-pay
